# Patient Record
Sex: Male | Born: 1935 | Race: White | Hispanic: No | Smoking: Former smoker
Health system: Southern US, Community
[De-identification: ages and names within clinical notes are randomized; demographics above are authoritative.]

## PROBLEM LIST (undated history)

## (undated) DIAGNOSIS — J449 Chronic obstructive pulmonary disease, unspecified: Secondary | ICD-10-CM

## (undated) DIAGNOSIS — I10 Essential (primary) hypertension: Secondary | ICD-10-CM

## (undated) DIAGNOSIS — F419 Anxiety disorder, unspecified: Secondary | ICD-10-CM

## (undated) DIAGNOSIS — N3941 Urge incontinence: Secondary | ICD-10-CM

## (undated) DIAGNOSIS — G500 Trigeminal neuralgia: Secondary | ICD-10-CM

## (undated) DIAGNOSIS — N401 Enlarged prostate with lower urinary tract symptoms: Secondary | ICD-10-CM

## (undated) DIAGNOSIS — E538 Deficiency of other specified B group vitamins: Secondary | ICD-10-CM

## (undated) DIAGNOSIS — M545 Low back pain, unspecified: Secondary | ICD-10-CM

## (undated) DIAGNOSIS — R200 Anesthesia of skin: Secondary | ICD-10-CM

## (undated) DIAGNOSIS — G4730 Sleep apnea, unspecified: Secondary | ICD-10-CM

## (undated) DIAGNOSIS — I4430 Unspecified atrioventricular block: Secondary | ICD-10-CM

## (undated) DIAGNOSIS — N289 Disorder of kidney and ureter, unspecified: Secondary | ICD-10-CM

## (undated) DIAGNOSIS — M109 Gout, unspecified: Secondary | ICD-10-CM

## (undated) DIAGNOSIS — F32A Depression, unspecified: Secondary | ICD-10-CM

## (undated) DIAGNOSIS — R972 Elevated prostate specific antigen [PSA]: Secondary | ICD-10-CM

## (undated) DIAGNOSIS — F329 Major depressive disorder, single episode, unspecified: Secondary | ICD-10-CM

## (undated) DIAGNOSIS — N400 Enlarged prostate without lower urinary tract symptoms: Secondary | ICD-10-CM

## (undated) DIAGNOSIS — K5792 Diverticulitis of intestine, part unspecified, without perforation or abscess without bleeding: Secondary | ICD-10-CM

## (undated) DIAGNOSIS — G8929 Other chronic pain: Secondary | ICD-10-CM

## (undated) DIAGNOSIS — M519 Unspecified thoracic, thoracolumbar and lumbosacral intervertebral disc disorder: Secondary | ICD-10-CM

## (undated) DIAGNOSIS — N138 Other obstructive and reflux uropathy: Secondary | ICD-10-CM

## (undated) HISTORY — DX: Diverticulitis of intestine, part unspecified, without perforation or abscess without bleeding: K57.92

## (undated) HISTORY — DX: Trigeminal neuralgia: G50.0

## (undated) HISTORY — DX: Urge incontinence: N39.41

## (undated) HISTORY — DX: Benign prostatic hyperplasia with lower urinary tract symptoms: N40.1

## (undated) HISTORY — DX: Deficiency of other specified B group vitamins: E53.8

## (undated) HISTORY — PX: CATARACT EXTRACTION: SUR2

## (undated) HISTORY — DX: Depression, unspecified: F32.A

## (undated) HISTORY — DX: Other obstructive and reflux uropathy: N13.8

## (undated) HISTORY — DX: Anesthesia of skin: R20.0

## (undated) HISTORY — DX: Low back pain, unspecified: M54.50

## (undated) HISTORY — DX: Major depressive disorder, single episode, unspecified: F32.9

## (undated) HISTORY — PX: PACEMAKER INSERTION: SHX728

## (undated) HISTORY — DX: Unspecified thoracic, thoracolumbar and lumbosacral intervertebral disc disorder: M51.9

## (undated) HISTORY — DX: Essential (primary) hypertension: I10

## (undated) HISTORY — PX: FUNCTIONAL ENDOSCOPIC SINUS SURGERY: SUR616

## (undated) HISTORY — DX: Sleep apnea, unspecified: G47.30

## (undated) HISTORY — DX: Disorder of kidney and ureter, unspecified: N28.9

## (undated) HISTORY — DX: Benign prostatic hyperplasia without lower urinary tract symptoms: N40.0

## (undated) HISTORY — PX: HERNIA REPAIR: SHX51

## (undated) HISTORY — DX: Chronic obstructive pulmonary disease, unspecified: J44.9

## (undated) HISTORY — DX: Gout, unspecified: M10.9

## (undated) HISTORY — PX: APPENDECTOMY: SHX54

## (undated) HISTORY — DX: Anxiety disorder, unspecified: F41.9

## (undated) HISTORY — PX: ESOPHAGOGASTRODUODENOSCOPY: SHX1529

## (undated) HISTORY — DX: Low back pain: M54.5

## (undated) HISTORY — DX: Other chronic pain: G89.29

## (undated) HISTORY — DX: Elevated prostate specific antigen (PSA): R97.20

---

## 2004-06-23 ENCOUNTER — Ambulatory Visit: Payer: Self-pay | Admitting: Cardiology

## 2004-09-07 ENCOUNTER — Ambulatory Visit: Payer: Self-pay | Admitting: Otolaryngology

## 2005-01-04 ENCOUNTER — Ambulatory Visit (HOSPITAL_COMMUNITY): Admission: RE | Admit: 2005-01-04 | Discharge: 2005-01-04 | Payer: Self-pay | Admitting: *Deleted

## 2005-06-20 ENCOUNTER — Ambulatory Visit: Payer: Self-pay | Admitting: Ophthalmology

## 2005-08-03 ENCOUNTER — Ambulatory Visit: Payer: Self-pay | Admitting: Vascular Surgery

## 2007-08-21 ENCOUNTER — Ambulatory Visit: Payer: Self-pay | Admitting: Rheumatology

## 2007-11-21 ENCOUNTER — Other Ambulatory Visit: Payer: Self-pay

## 2007-11-21 ENCOUNTER — Inpatient Hospital Stay: Payer: Self-pay | Admitting: Internal Medicine

## 2012-04-06 DIAGNOSIS — N400 Enlarged prostate without lower urinary tract symptoms: Secondary | ICD-10-CM | POA: Insufficient documentation

## 2012-04-06 DIAGNOSIS — N401 Enlarged prostate with lower urinary tract symptoms: Secondary | ICD-10-CM | POA: Insufficient documentation

## 2012-04-06 DIAGNOSIS — R350 Frequency of micturition: Secondary | ICD-10-CM | POA: Insufficient documentation

## 2012-04-06 DIAGNOSIS — R972 Elevated prostate specific antigen [PSA]: Secondary | ICD-10-CM | POA: Insufficient documentation

## 2013-01-30 ENCOUNTER — Inpatient Hospital Stay: Payer: Self-pay | Admitting: Internal Medicine

## 2013-01-30 DIAGNOSIS — Z950 Presence of cardiac pacemaker: Secondary | ICD-10-CM | POA: Insufficient documentation

## 2013-01-30 LAB — URINALYSIS, COMPLETE
Bilirubin,UR: NEGATIVE
Blood: NEGATIVE
Ketone: NEGATIVE
Leukocyte Esterase: NEGATIVE
RBC,UR: <1 /HPF (ref 0–5)
Specific Gravity: 1.015 (ref 1.003–1.030)
Squamous Epithelial: NONE SEEN
WBC UR: NONE SEEN /HPF (ref 0–5)

## 2013-01-30 LAB — TROPONIN I: Troponin-I: <0.02 ng/mL

## 2013-01-30 LAB — CBC
HCT: 41.4 % (ref 40.0–52.0)
MCH: 32.3 pg (ref 26.0–34.0)
MCHC: 34.4 g/dL (ref 32.0–36.0)
MCV: 94 fL (ref 80–100)
Platelet: 189 10*3/uL (ref 150–440)
RBC: 4.41 10*6/uL (ref 4.40–5.90)
WBC: 10.1 10*3/uL (ref 3.8–10.6)

## 2013-01-30 LAB — CK TOTAL AND CKMB (NOT AT ARMC)
CK, Total: 163 U/L (ref 35–232)
CK-MB: 3.8 ng/mL — ABNORMAL HIGH (ref 0.5–3.6)
CK-MB: 2.7 ng/mL (ref 0.5–3.6)

## 2013-01-30 LAB — COMPREHENSIVE METABOLIC PANEL
Alkaline Phosphatase: 55 U/L (ref 50–136)
BUN: 21 mg/dL — ABNORMAL HIGH (ref 7–18)
Calcium, Total: 9 mg/dL (ref 8.5–10.1)
Co2: 24 mmol/L (ref 21–32)
Creatinine: 1.5 mg/dL — ABNORMAL HIGH (ref 0.60–1.30)
EGFR (African American): 50 — ABNORMAL LOW
EGFR (Non-African Amer.): 43 — ABNORMAL LOW
Potassium: 4.4 mmol/L (ref 3.5–5.1)
Total Protein: 7.1 g/dL (ref 6.4–8.2)

## 2013-01-30 LAB — PROTIME-INR
INR: 1
Prothrombin Time: 13.3 secs (ref 11.5–14.7)

## 2013-01-31 LAB — CBC WITH DIFFERENTIAL/PLATELET
Basophil #: 0.1 10*3/uL (ref 0.0–0.1)
Eosinophil #: 0.1 10*3/uL (ref 0.0–0.7)
Eosinophil %: 0.7 %
HGB: 13.8 g/dL (ref 13.0–18.0)
Monocyte #: 0.8 x10 3/mm (ref 0.2–1.0)
Monocyte %: 10.7 %
Neutrophil #: 4.9 10*3/uL (ref 1.4–6.5)
RBC: 4.26 10*6/uL — ABNORMAL LOW (ref 4.40–5.90)
RDW: 14.3 % (ref 11.5–14.5)
WBC: 7.7 10*3/uL (ref 3.8–10.6)

## 2013-01-31 LAB — BASIC METABOLIC PANEL
Anion Gap: 6 — ABNORMAL LOW (ref 7–16)
BUN: 17 mg/dL (ref 7–18)
Calcium, Total: 9.1 mg/dL (ref 8.5–10.1)
Chloride: 107 mmol/L (ref 98–107)
EGFR (African American): 56 — ABNORMAL LOW
Glucose: 85 mg/dL (ref 65–99)
Osmolality: 280 (ref 275–301)
Potassium: 4.2 mmol/L (ref 3.5–5.1)

## 2013-01-31 LAB — LIPID PANEL
Cholesterol: 126 mg/dL (ref 0–200)
HDL Cholesterol: 33 mg/dL — ABNORMAL LOW (ref 40–60)
Ldl Cholesterol, Calc: 59 mg/dL (ref 0–100)
Triglycerides: 172 mg/dL (ref 0–200)

## 2013-01-31 LAB — CK TOTAL AND CKMB (NOT AT ARMC)
CK, Total: 89 U/L (ref 35–232)
CK-MB: 1.8 ng/mL (ref 0.5–3.6)

## 2013-01-31 LAB — TROPONIN I: Troponin-I: <0.02 ng/mL

## 2013-07-22 ENCOUNTER — Ambulatory Visit: Payer: Self-pay | Admitting: Physical Medicine and Rehabilitation

## 2013-09-12 DIAGNOSIS — J449 Chronic obstructive pulmonary disease, unspecified: Secondary | ICD-10-CM | POA: Insufficient documentation

## 2013-09-12 DIAGNOSIS — I10 Essential (primary) hypertension: Secondary | ICD-10-CM | POA: Insufficient documentation

## 2013-10-11 DIAGNOSIS — M5116 Intervertebral disc disorders with radiculopathy, lumbar region: Secondary | ICD-10-CM | POA: Insufficient documentation

## 2013-10-11 DIAGNOSIS — M47816 Spondylosis without myelopathy or radiculopathy, lumbar region: Secondary | ICD-10-CM | POA: Insufficient documentation

## 2013-10-11 DIAGNOSIS — M51369 Other intervertebral disc degeneration, lumbar region without mention of lumbar back pain or lower extremity pain: Secondary | ICD-10-CM | POA: Insufficient documentation

## 2013-10-11 DIAGNOSIS — M5136 Other intervertebral disc degeneration, lumbar region: Secondary | ICD-10-CM | POA: Insufficient documentation

## 2014-04-07 DIAGNOSIS — E538 Deficiency of other specified B group vitamins: Secondary | ICD-10-CM | POA: Insufficient documentation

## 2014-04-07 DIAGNOSIS — G479 Sleep disorder, unspecified: Secondary | ICD-10-CM | POA: Insufficient documentation

## 2014-04-07 DIAGNOSIS — G500 Trigeminal neuralgia: Secondary | ICD-10-CM | POA: Insufficient documentation

## 2014-04-07 DIAGNOSIS — R200 Anesthesia of skin: Secondary | ICD-10-CM | POA: Insufficient documentation

## 2014-05-20 ENCOUNTER — Ambulatory Visit (INDEPENDENT_AMBULATORY_CARE_PROVIDER_SITE_OTHER): Payer: Medicare Other

## 2014-05-20 ENCOUNTER — Ambulatory Visit (INDEPENDENT_AMBULATORY_CARE_PROVIDER_SITE_OTHER): Payer: Medicare Other | Admitting: Podiatry

## 2014-05-20 ENCOUNTER — Encounter: Payer: Self-pay | Admitting: Podiatry

## 2014-05-20 VITALS — BP 132/75 | HR 75 | Resp 16 | Ht 70.0 in | Wt 190.0 lb

## 2014-05-20 DIAGNOSIS — M779 Enthesopathy, unspecified: Secondary | ICD-10-CM

## 2014-05-20 DIAGNOSIS — M10072 Idiopathic gout, left ankle and foot: Secondary | ICD-10-CM

## 2014-05-20 DIAGNOSIS — M109 Gout, unspecified: Secondary | ICD-10-CM

## 2014-05-20 MED ORDER — METHYLPREDNISOLONE (PAK) 4 MG PO TABS: ORAL_TABLET | ORAL | Status: DC

## 2014-05-20 NOTE — Patient Instructions (Signed)
Monitor for any signs/symptoms of infection. Call the office immediately if any occur or go directly to the emergency room. Call with any questions/concerns.    Gout Gout is an inflammatory arthritis caused by a buildup of uric acid crystals in the joints. Uric acid is a chemical that is normally present in the blood. When the level of uric acid in the blood is too high it can form crystals that deposit in your joints and tissues. This causes joint redness, soreness, and swelling (inflammation). Repeat attacks are common. Over time, uric acid crystals can form into masses (tophi) near a joint, destroying bone and causing disfigurement. Gout is treatable and often preventable. CAUSES  The disease begins with elevated levels of uric acid in the blood. Uric acid is produced by your body when it breaks down a naturally found substance called purines. Certain foods you eat, such as meats and fish, contain high amounts of purines. Causes of an elevated uric acid level include:  Being passed down from parent to child (heredity).  Diseases that cause increased uric acid production (such as obesity, psoriasis, and certain cancers).  Excessive alcohol use.  Diet, especially diets rich in meat and seafood.  Medicines, including certain cancer-fighting medicines (chemotherapy), water pills (diuretics), and aspirin.  Chronic kidney disease. The kidneys are no longer able to remove uric acid well.  Problems with metabolism. Conditions strongly associated with gout include:  Obesity.  High blood pressure.  High cholesterol.  Diabetes. Not everyone with elevated uric acid levels gets gout. It is not understood why some people get gout and others do not. Surgery, joint injury, and eating too much of certain foods are some of the factors that can lead to gout attacks. SYMPTOMS   An attack of gout comes on quickly. It causes intense pain with redness, swelling, and warmth in a joint.  Fever can  occur.  Often, only one joint is involved. Certain joints are more commonly involved:  Base of the big toe.  Knee.  Ankle.  Wrist.  Finger. Without treatment, an attack usually goes away in a few days to weeks. Between attacks, you usually will not have symptoms, which is different from many other forms of arthritis. DIAGNOSIS  Your caregiver will suspect gout based on your symptoms and exam. In some cases, tests may be recommended. The tests may include:  Blood tests.  Urine tests.  X-rays.  Joint fluid exam. This exam requires a needle to remove fluid from the joint (arthrocentesis). Using a microscope, gout is confirmed when uric acid crystals are seen in the joint fluid. TREATMENT  There are two phases to gout treatment: treating the sudden onset (acute) attack and preventing attacks (prophylaxis).  Treatment of an Acute Attack.  Medicines are used. These include anti-inflammatory medicines or steroid medicines.  An injection of steroid medicine into the affected joint is sometimes necessary.  The painful joint is rested. Movement can worsen the arthritis.  You may use warm or cold treatments on painful joints, depending which works best for you.  Treatment to Prevent Attacks.  If you suffer from frequent gout attacks, your caregiver may advise preventive medicine. These medicines are started after the acute attack subsides. These medicines either help your kidneys eliminate uric acid from your body or decrease your uric acid production. You may need to stay on these medicines for a very long time.  The early phase of treatment with preventive medicine can be associated with an increase in acute gout attacks. For   this reason, during the first few months of treatment, your caregiver may also advise you to take medicines usually used for acute gout treatment. Be sure you understand your caregiver's directions. Your caregiver may make several adjustments to your medicine  dose before these medicines are effective.  Discuss dietary treatment with your caregiver or dietitian. Alcohol and drinks high in sugar and fructose and foods such as meat, poultry, and seafood can increase uric acid levels. Your caregiver or dietitian can advise you on drinks and foods that should be limited. HOME CARE INSTRUCTIONS   Do not take aspirin to relieve pain. This raises uric acid levels.  Only take over-the-counter or prescription medicines for pain, discomfort, or fever as directed by your caregiver.  Rest the joint as much as possible. When in bed, keep sheets and blankets off painful areas.  Keep the affected joint raised (elevated).  Apply warm or cold treatments to painful joints. Use of warm or cold treatments depends on which works best for you.  Use crutches if the painful joint is in your leg.  Drink enough fluids to keep your urine clear or pale yellow. This helps your body get rid of uric acid. Limit alcohol, sugary drinks, and fructose drinks.  Follow your dietary instructions. Pay careful attention to the amount of protein you eat. Your daily diet should emphasize fruits, vegetables, whole grains, and fat-free or low-fat milk products. Discuss the use of coffee, vitamin C, and cherries with your caregiver or dietitian. These may be helpful in lowering uric acid levels.  Maintain a healthy body weight. SEEK MEDICAL CARE IF:   You develop diarrhea, vomiting, or any side effects from medicines.  You do not feel better in 24 hours, or you are getting worse. SEEK IMMEDIATE MEDICAL CARE IF:   Your joint becomes suddenly more tender, and you have chills or a fever. MAKE SURE YOU:   Understand these instructions.  Will watch your condition.  Will get help right away if you are not doing well or get worse. Document Released: 05/20/2000 Document Revised: 10/07/2013 Document Reviewed: 01/04/2012 [employer] Patient Information 2015 [employer]. This information  is not intended to replace advice given to you by your health care provider. Make sure you discuss any questions you have with your health care provider.  

## 2014-05-20 NOTE — Progress Notes (Signed)
   Subjective:    Patient ID: Ronald Weber, male    DOB: Mar 09, 1929, 80 y.o.   MRN: [id number]  HPI Comments: 80 year old male presents the office they with complaints of left foot swelling and pain. He states that this has been ongoing for approximate 5 days his remained about the same. He definitely he did start taking once a day prednisone a couple days ago however it has not been helping. He states that he has pain particularly with walking and standing and pressure over the area. Patient states this has happened before and he has received a steroid injection which helped alleviate his symptoms. Patient denies any recent open lesions, cut/scrapes the foot. He denies any systemic complaints such as fevers, chills, nausea, vomiting. Denies any recent injury or trauma to the area. No other complaints at this time.  Foot Pain Associated symptoms include coughing.      Review of Systems  Respiratory: Positive for cough.   All other systems reviewed and are negative.      Objective:   Physical Exam  AAO 3, NAD DP/PT pulses palpable bilaterally, CRT less than 3 seconds Protective sensation intact with Simms Weinstein monofilament, vibratory sensation intact, Achilles tendon reflex intact. There is edema to the left foot. There is erythema overlying the first MTPJ and along the dorsal medial aspect of the left foot approximately level of the first metatarsal cuneiform joint. There is also localized erythema over this area. Erythema is ill-defined and there is no evidence of ascending cellulitis. There is no areas of fluctuance or crepitus. There is no pain with ankle joint or subtalar joint range of motion. There is mild discomfort with first MTPJ range of motion. No open lesions or pre-ulcerative lesions. No pain with calf compression, swelling, warmth, erythema. MMT and ROM decreased on the left, intact on the right.          Assessment & Plan:  80 year old male with left foot  erythema, edema likely gout. -X-rays were obtained and reviewed with the patient. -Treatment options were discussed including alternatives, risks, complications. -At this time, given the patient's history and current symptoms will treat for gout. We'll start a Medrol Dosepak. We'll obtain CBC, ESR, CRP, uric acid. We'll call the patient once the results are obtained. -Patient is requesting steroid injection into the area as he is previously had this which helped alleviate his symptoms. Risks and complications of the injection were discussed the patient. Under sterile conditions a total of 1.5 mL of a mixture of dexamethasone and 0.5% Marcaine plain was infiltrated into the area of tenderness overlying the first MTPJ in medial foot. Band-Aids were applied. Patient tolerated the injection well without any complications. Post injection care was discussed with the patient. -Continue to monitor the area for any changes symptoms or any worsening. Should any symptoms worsened the patient directed to call the office immediately or go directly to the emergency room. -Follow-up in 1 week or sooner should any problems arise. In the meantime, call the office with any questions, concerns, change in symptoms.

## 2014-05-21 LAB — CBC WITH DIFFERENTIAL/PLATELET
Basophils Absolute: 0 10*3/uL (ref 0.0–0.2)
Basos: 0 %
Eos: 0 %
Eosinophils Absolute: 0 10*3/uL (ref 0.0–0.4)
HCT: 40.5 % (ref 37.5–51.0)
HEMOGLOBIN: 13.2 g/dL (ref 12.6–17.7)
IMMATURE GRANS (ABS): 0 10*3/uL (ref 0.0–0.1)
IMMATURE GRANULOCYTES: 0 %
Lymphocytes Absolute: 0.8 10*3/uL (ref 0.7–3.1)
Lymphs: 7 %
MCH: 30.6 pg (ref 26.6–33.0)
MCHC: 32.6 g/dL (ref 31.5–35.7)
MCV: 94 fL (ref 79–97)
MONOCYTES: 10 %
Monocytes Absolute: 1.2 10*3/uL — ABNORMAL HIGH (ref 0.1–0.9)
NEUTROS PCT: 83 %
Neutrophils Absolute: 10.3 10*3/uL — ABNORMAL HIGH (ref 1.4–7.0)
RBC: 4.31 x10E6/uL (ref 4.14–5.80)
RDW: 14.8 % (ref 12.3–15.4)
WBC: 12.4 10*3/uL — ABNORMAL HIGH (ref 3.4–10.8)

## 2014-05-21 LAB — SEDIMENTATION RATE: Sed Rate: 8 mm/hr (ref 0–30)

## 2014-05-21 LAB — URIC ACID: Uric Acid: 7.8 mg/dL (ref 3.7–8.6)

## 2014-05-21 LAB — C-REACTIVE PROTEIN: CRP: 6.7 mg/L — AB (ref 0.0–4.9)

## 2014-05-22 ENCOUNTER — Telehealth: Payer: Self-pay | Admitting: Podiatry

## 2014-05-22 NOTE — Telephone Encounter (Signed)
I call the patient to discuss the blood work results. The patient states that since his appointment he has had decreased pain, swelling, redness. He is now able to ambulate without at the assistance of a cane. I discussed with the patient that his white blood cell count was elevated to 12.4 and his uric acid was within normal limits and CRP was 6.7. I discussed with the White blood cell count could be elevated due to infection or possible steroid use. At this time as the patient has decreased symptoms on the steroids and after the injection will continue to treat his gout. I discussed with the patient to monitor closely for any signs or symptoms of infection and directed to call the office immediately should any occur or go directly to the emergency room. Patient encouraged to call with any questions or concerns or any change in symptoms. Follow-up as scheduled, or sooner if needed.

## 2014-05-27 ENCOUNTER — Ambulatory Visit (INDEPENDENT_AMBULATORY_CARE_PROVIDER_SITE_OTHER): Payer: Medicare Other | Admitting: Podiatry

## 2014-05-27 VITALS — BP 133/75 | HR 96 | Resp 16

## 2014-05-27 DIAGNOSIS — M109 Gout, unspecified: Secondary | ICD-10-CM

## 2014-05-27 DIAGNOSIS — M10072 Idiopathic gout, left ankle and foot: Secondary | ICD-10-CM

## 2014-05-27 NOTE — Patient Instructions (Signed)

## 2014-06-02 NOTE — Progress Notes (Signed)
Patient ID: Ronald Weber, male   DOB: [birth year], 80 y.o.   MRN: [id number]  Subjective: 80 year old male returns the office today for follow-up evaluation of left foot likely gout. The patient states that he has completed his course of steroids. At this time he states that he has no pain to the area and the swelling has significantly improved. He states he is able to ambulate without any difficulty. He denies any systemic complaints as fevers, chills, nausea, vomiting. No acute changes since last evening no other complaints at this time.  Objective: AAO 3, NAD DP/PT pulses palpable laterally, CRT less than 3 seconds  Protective sensation is intact with Simms Weinstein monofilament, vibratory sensation intact On the left foot there is no overlying edema, erythema, increase in warmth. There is no tenderness to palpation or pain with vibratory sensation. The patient is able to ambulate without the assistance of a cane.  No open lesions or pre-ulcerative lesions. No pain with calf compression, swelling, warmth, erythema.  Assessment: 80 year old male with resolving left foot pain, likely gout  Plan: -Treatment options discussed with the patient including alternatives, risks, complications -At this time the patient symptoms have resolved. Discussed with the patient ways to help prevent future gout occurrences. -Follow-up as needed. In the meantime, call the office with any questions, concerns, change in symptoms.

## 2014-08-05 ENCOUNTER — Ambulatory Visit (INDEPENDENT_AMBULATORY_CARE_PROVIDER_SITE_OTHER): Payer: Medicare Other | Admitting: Podiatry

## 2014-08-05 ENCOUNTER — Ambulatory Visit (INDEPENDENT_AMBULATORY_CARE_PROVIDER_SITE_OTHER): Payer: Medicare Other

## 2014-08-05 ENCOUNTER — Encounter: Payer: Self-pay | Admitting: Podiatry

## 2014-08-05 VITALS — BP 182/88 | HR 75

## 2014-08-05 DIAGNOSIS — M10072 Idiopathic gout, left ankle and foot: Secondary | ICD-10-CM | POA: Diagnosis not present

## 2014-08-05 DIAGNOSIS — R609 Edema, unspecified: Secondary | ICD-10-CM

## 2014-08-05 DIAGNOSIS — M109 Gout, unspecified: Secondary | ICD-10-CM

## 2014-08-05 MED ORDER — METHYLPREDNISOLONE (PAK) 4 MG PO TABS: ORAL_TABLET | ORAL | Status: DC

## 2014-08-05 NOTE — Patient Instructions (Signed)

## 2014-08-06 ENCOUNTER — Encounter: Payer: Self-pay | Admitting: Podiatry

## 2014-08-06 DIAGNOSIS — M109 Gout, unspecified: Secondary | ICD-10-CM | POA: Insufficient documentation

## 2014-08-06 LAB — SEDIMENTATION RATE: Sed Rate: 3 mm/hr (ref 0–30)

## 2014-08-06 LAB — CBC WITH DIFFERENTIAL/PLATELET
BASOS ABS: 0 10*3/uL (ref 0.0–0.2)
Basos: 1 %
EOS ABS: 0.1 10*3/uL (ref 0.0–0.4)
Eos: 1 %
HEMATOCRIT: 41.7 % (ref 37.5–51.0)
Hemoglobin: 13.7 g/dL (ref 12.6–17.7)
Immature Grans (Abs): 0 10*3/uL (ref 0.0–0.1)
Immature Granulocytes: 0 %
LYMPHS ABS: 0.9 10*3/uL (ref 0.7–3.1)
Lymphs: 11 %
MCH: 30.1 pg (ref 26.6–33.0)
MCHC: 32.9 g/dL (ref 31.5–35.7)
MCV: 92 fL (ref 79–97)
Monocytes Absolute: 0.9 10*3/uL (ref 0.1–0.9)
Monocytes: 11 %
NEUTROS ABS: 6.3 10*3/uL (ref 1.4–7.0)
Neutrophils Relative %: 76 %
Platelets: 207 10*3/uL (ref 150–379)
RBC: 4.55 x10E6/uL (ref 4.14–5.80)
RDW: 13.6 % (ref 12.3–15.4)
WBC: 8.2 10*3/uL (ref 3.4–10.8)

## 2014-08-06 LAB — C-REACTIVE PROTEIN: CRP: 25 mg/L — AB (ref 0.0–4.9)

## 2014-08-06 LAB — URIC ACID: URIC ACID: 8.6 mg/dL (ref 3.7–8.6)

## 2014-08-06 NOTE — Progress Notes (Signed)
Patient ID: Ronald Weber, male   DOB: 1928/12/28, 81 y.o.   MRN: [id number]  Subjective: 81 year old male presents the office today with complaints of left lateral ankle swelling, redness, pain. He states this started naproxen 4 days ago. He denies any history of injury or trauma. He states that it was a sudden onset and is very painful. He states he has pain with ambulation. He denies any recent open lesions or any abrasions to the area. He denies any change in diet recently. He denies any systemic complaints as fevers, chills, nausea, vomiting. No other complaints at this time.  Objective: AAO 3, NAD, ambulates with a cane wearing a regular shoe DP/PT pulses palpable although decreased bilaterally, CRT less than 3 seconds Protective sensation intact with Simms Weinstein monofilament, vibratory sensation intact, Achilles tendon reflex intact. On the left ankle on the lateral aspect there is an annular area of erythema and edema. There is mild increase in warmth overlying the area of erythema. There is mild discomfort with ankle joint range of motion particularly over the lateral aspect of the ankle. There is no overlying areas of fluctuance or crepitus. There is no ascending cellulitis. There does not appear to be any pinpoint areas of bony tenderness or pain the vibratory sensation overlying the foot or ankle. No open lesions or pre-ulcerative lesions are identified bilaterally. No pain with calf compression, swelling, warmth, erythema.  Assessment: 81 year old male with likely recurrent gout  Plan: -X-rays were obtained and reviewed the patient. -Treatment options were discussed with the patient include alternatives, risks, complications -At this time discussed likely etiology of the patient's symptoms and most likely correlates to gout. At this time would like to start treatment for gout however he is allergic to colchicine (states he cannot tolerate taking it at all), and he has been advised  not to take any anti-inflammatories due to his heart condition. Because of this we'll start a Medrol Dosepak and obtain blood work. -Patient has also elected to proceed with a steroid injection into the area. Under sterile conditions a total of 1 mL mixture of dexamethasone phosphate and 0.5% Marcaine plain was infiltrated into the area of maximal tenderness on the lateral aspect of the left ankle. A Band-Aid was applied. Postinjection care was discussed with the patient. -Recommended the patient to follow-up with his primary care physician regards this as this is now his likely second gout attack in the last couple of months. He may benefit from allopurinol or Uloric.  -Follow-up in 2 weeks or sooner any problems are to arise. In the meantime, encouraged to call the office with any questions, concerns, change in symptoms.

## 2014-09-08 ENCOUNTER — Encounter: Payer: Self-pay | Admitting: Podiatry

## 2014-09-08 ENCOUNTER — Ambulatory Visit (INDEPENDENT_AMBULATORY_CARE_PROVIDER_SITE_OTHER): Payer: Medicare Other | Admitting: Podiatry

## 2014-09-08 VITALS — BP 169/106 | HR 82 | Resp 18 | Ht 70.0 in | Wt 190.0 lb

## 2014-09-08 DIAGNOSIS — M10072 Idiopathic gout, left ankle and foot: Secondary | ICD-10-CM | POA: Diagnosis not present

## 2014-09-08 DIAGNOSIS — M109 Gout, unspecified: Secondary | ICD-10-CM

## 2014-09-08 DIAGNOSIS — M779 Enthesopathy, unspecified: Secondary | ICD-10-CM | POA: Diagnosis not present

## 2014-09-08 MED ORDER — PREDNISONE 10 MG PO TABS: ORAL_TABLET | ORAL | Status: DC

## 2014-09-08 MED ORDER — COLCHICINE 0.6 MG PO TABS: 0.6000 mg | ORAL_TABLET | Freq: Every day | ORAL | Status: DC

## 2014-09-08 NOTE — Progress Notes (Signed)
[name] presents today with a chief complaint of painful first metatarsophalangeal joint of the right foot. He states that he has had gout for years and was on colchicine until he ran out and never refilled the medication. He states that usually he takes prednisone which helped considerably. He states that this episode flared up just a few days ago and is extremely painful. He denies fever chills nausea vomiting muscle aches and pains.  Objective: Pulses are strongly palpable to the right foot. Erythema and edema to the first metatarsophalangeal joint which is painful on palpation and is very warm to the touch. There is no cellulitis no streaking no signs of infection.  Assessment: Capsulitis hallux valgus deformity with gouty capsulitis first metatarsophalangeal joint right foot.  Plan: Injected peri-articularly today with Kenalog and local anesthetic. Put him on a six-day dose of prednisone 10 mg tablets. Also started him on colchicine. I will follow-up with him in 2 weeks.

## 2014-09-26 NOTE — Op Note (Signed)
PATIENT NAME:  Patricia PesaANCE, Daymon T MR#:  [id number] DATE OF BIRTH:  Jan 12, 1929  DATE OF PROCEDURE:  01/31/2013  PRIMARY CARE PHYSICIAN: Dr. Dareen PianoAnderson.  PREPROCEDURE DIAGNOSIS: Complete heart block.   PROCEDURE: Dual chamber pacemaker implantation.   POSTPROCEDURE DIAGNOSIS: Atrial sensing with ventricular pacing.   INDICATION: The patient is an 81 year old gentleman, who [facility] with generalized weakness, shortness of breath and dizziness for several weeks. In the Emergency Room, the patient was noted to be markedly bradycardic with a heart rate of 33 bpm and AV dissociation consistent with complete heart block. The procedure, risks benefits, and alternatives of permanent pacemaker implantation were explained to the patient and informed written consent was obtained.   DESCRIPTION OF PROCEDURE: He was brought to the operating room in a fasting state. The left pectoral region was prepped and draped in the usual sterile manner. Anesthesia was obtained with 1% Xylocaine locally. A 6 cm incision was performed over the left pectoral region. The pacemaker pocket was generated by electrocautery and blunt dissection. Access was obtained to the left subclavian vein by fine needle aspiration. The right ventricle and right atrial leads were positioned to right ventricular intraventricular septum and right atrial appendage. After proper thresholds were obtained, the leads were sutured in place. The pacemaker pocket was irrigated with gentamicin solution. The leads were connected to a dual chamber rate responsive pacemaker generator (Medtronic Adapta [id number]) and positioned into the pocket. The pocket was closed with 2-0 and 4-0 Vicryl respectively. Steri-Strips and a compression dressing were applied.   ____________________________ Marcina MillardAlexander Oddie Bottger, MD ap:aw D: 01/31/2013 13:31:43 ET T: 01/31/2013 13:57:38 ET JOB#: [id number]  cc: Marcina MillardAlexander Colden Samaras, MD, <Dictator> Marcina MillardALEXANDER Clair Alfieri  MD ELECTRONICALLY SIGNED 02/14/2013 7:58

## 2014-09-26 NOTE — Discharge Summary (Signed)
PATIENT NAME:  Ronald Weber, Naol T MR#:  [id number] DATE OF BIRTH:  05/31/1929  DATE OF ADMISSION:  01/30/2013 DATE OF DISCHARGE:  02/01/2013  DISCHARGE DIAGNOSES: 1.  Third degree heart block.  2.  Insomnia.   DISCHARGE MEDICATIONS: Per [facility] med reconciliation system.   HISTORY AND PHYSICAL: Please see detailed history and physical done on admission.  HOSPITAL COURSE:  The patient was admitted with third degree heart block, rate in the 30s. Cardiology consult  was obtained. They elected to watch him overnight with temporary external pads on. He remained stable with that. Permanent pacemaker was inserted the next day, on the 28th.  He remained stable with that. Seems to be functioning this morning. Follow up with the inserting cardiologist next week for further evaluation. Further workup was unrevealing. Chest x-ray was fine post insertion. He ruled out for MI by cardiac enzymes. TSH was in the normal range. Cholesterol was 126 with LDL of 59. Creatinine was minimally elevated at 1.36 and this is likely perfusion related from above.    ____________________________ Marya AmslerMarshall W. Dareen PianoAnderson, MD mwa:dmm D: 02/01/2013 07:52:49 ET T: 02/01/2013 09:34:25 ET JOB#: [id number]  cc: Marya AmslerMarshall W. Dareen PianoAnderson, MD, <Dictator> Lauro RegulusMARSHALL W Chantea Surace MD ELECTRONICALLY SIGNED 02/01/2013 13:46

## 2014-09-26 NOTE — H&P (Signed)
PATIENT NAME:  Patricia PesaANCE, Divante T MR#:  [id number] DATE OF BIRTH:  07/10/1928  DATE OF ADMISSION:  01/30/2013  PRIMARY CARE PROVIDER: Marya AmslerMarshall W. Dareen PianoAnderson, MD  EMERGENCY DEPARTMENT REFERRING PHYSICIAN: Sheryl L. Mindi JunkerGottlieb, MD   CHIEF COMPLAINT: Generalized weakness, low pulse rate.   HISTORY OF PRESENT ILLNESS: The patient is an [age] year old white male who has history of hypercholesterolemia, COPD/asthma, history of pernicious anemia, hypertension, who is not on any medications, who states that he was doing well up until the past few weeks, when he has been feeling weak. His wife has a pulse oximetry meter, so he checks his pulse on a daily basis, and he checked his pulse today, and it was in the 30s. The patient came to the ED and was noted to have a third-degree heart block. His blood pressure is currently actually elevated at 192/97. The patient complains of some shortness of breath and weakness, but no chest pain. He was seen in consultation by Dr. Gwen PoundsKowalski, who will be placing a pacemaker tomorrow. The patient otherwise denies any chest pains or palpitations. He did have some dizziness. He denies any abdominal pain, nausea, vomiting or diarrhea.   PAST MEDICAL HISTORY:  1. History of postpolio neuropathy.  2. History of COPD, which he denies.  3. History of diverticulitis.  4. Hypertension.  5. Sleep apnea.  6. Gout.  7. Lumbar disk disease.  8. History of pernicious anemia.   PAST SURGICAL HISTORY:  1. Status post appendectomy with rupture.  2. Hernia repair.   CURRENT MEDICATIONS:  1. Ambien 5 at bedtime.  2. Tramadol 50 daily in the morning.  3. Vitamin B12 1000 mcg IM monthly.   SOCIAL HISTORY: Smokes cigars on a daily basis. Drinks socially. No drug use.   ALLERGIES: SULFA, AUGMENTIN, DOXYCYCLINE, PNEUMO TUSSIN, MOBIC.    FAMILY HISTORY: Positive for hypertension.   REVIEW OF SYSTEMS:   CONSTITUTIONAL: Denies any fever. Complains of fatigue, weakness. No pain. No weight loss,  no weight gain.  EYES: No blurred or double vision. No pain. No redness. No inflammation. Has a history of cataracts. No glaucoma.  RESPIRATORY: Denies any cough, wheezing, hemoptysis. Has history of COPD, but not on any inhalers.  CARDIOVASCULAR: Denies any chest pain, orthopnea, edema or arrhythmia. No palpitations. No syncope.  GASTROINTESTINAL: No nausea, vomiting, diarrhea. No abdominal pain. No hematemesis. No melena. No ulcer. No GERD. No IBS. No jaundice.  GENITOURINARY: Denies any dysuria, hematuria, renal calculus or frequency.  ENDOCRINE: Denies any polyuria, nocturia or thyroid problems.  HEMATOLOGIC AND LYMPHATIC: Denies anemia, easy bruisability or bleeding.  SKIN: No acne. No rash. No changes in mole, hair or skin.  MUSCULOSKELETAL: Denies any pain in the neck, back or shoulder.  NEUROLOGIC: No numbness. No CVA. No TIA. No seizures.  PSYCHIATRIC: No anxiety, insomnia or ADD.   PHYSICAL EXAMINATION:  VITAL SIGNS: Temperature 97.9, pulse was 32, respirations 20, blood pressure 181/77, O2 98%.  GENERAL: The patient is a well-developed male. Appears weak.  HEENT: Head normocephalic, atraumatic. Eyes: Pupils equally round and reactive to light and accommodation. There is no conjunctival pallor. No scleral icterus. Nasal exam shows no nasal lesions or drainage. Ears: There is no drainage or external lesions. Mouth: No exudate. No lesions.  NECK: Supple and symmetric. No masses. Thyroid midline, nonenlarged. No JVD.  RESPIRATORY: Good respiratory effort. Clear to auscultation bilaterally without any rales, rhonchi or wheezing.  CARDIOVASCULAR: Regular rate and rhythm. No murmurs, clicks, gallops or heaves. PMI is not displaced.  ABDOMEN: Soft, nontender, nondistended. Positive bowel sounds x4. No hepatosplenomegaly.  GENITOURINARY: Deferred.  MUSCULOSKELETAL: There is no erythema or swelling.  VASCULAR: Good DP, PT pulses.  PSYCHIATRIC: Not anxious or depressed.  SKIN: No rash.   LYMPHATICS: No lymph nodes palpable.  NEUROLOGICAL: Cranial nerves II through XII grossly intact. No focal deficits.  PSYCHIATRIC: Not anxious or depressed.   EVALUATIONS: WBC 10.1, hemoglobin 14.2, platelet count 189. BMP is currently pending. EKG shows third-degree heart block.   ASSESSMENT AND PLAN: The patient is an 81 year old white male with history of peripheral vascular disease, chronic obstructive pulmonary disease, hypertension, not on any treatment for any of these problems, who presents with generalized weakness, noted to have a third-degree heart block.   1. Third-degree heart block. This is a very unstable rhythm. Has been seen by cardiology. The patient will have external pacemaker pads all the time. Based on his heart rate and his symptomatology, he may need to be externally paced. If heart rate does drop significantly, also could use pressors to help with his heart rate. We will admit the patient to the ICU, monitor him very closely. Will follow his electrolytes, including his potassium and magnesium, also check a TSH. The patient will need pacemaker; cardiology to put that in tomorrow.  2. Chronic obstructive pulmonary disease, not on any treatment. Will monitor for any further symptoms. 3. Hypertension, accelerated at this point. In light of his bradycardia, I will use enalapril IV p.r.n., which should not have any effect on the heart rate.  4. Miscellaneous. Will use lower extremity SCDs for deep vein thrombosis prophylaxis.  In light of the patient's significant cardiac heart block, he is at high risk of cardiopulmonary collapse. He is critically ill.  TIME SPENT: 45 minutes spent.    ____________________________ Lacie Scotts. Allena Katz, MD shp:OSi D: 01/30/2013 13:34:04 ET T: 01/30/2013 13:55:37 ET JOB#: [id number]  cc: Apphia Cropley H. Allena Katz, MD, <Dictator> Charise Carwin MD ELECTRONICALLY SIGNED 01/30/2013 14:18

## 2014-09-26 NOTE — Consult Note (Signed)
PATIENT NAME:  Ronald Weber, Ronald Weber MR#:  [id number] DATE OF BIRTH:  [birth year]  DATE OF CONSULTATION:  01/30/2013  REFERRING PHYSICIAN:  Sheryl L. Mindi JunkerGottlieb, MD CONSULTING PHYSICIAN:  Lamar BlinksBruce J. Rosalie Gelpi, MD PRIMARY CARE PHYSICIAN: Marya AmslerMarshall W. Dareen PianoAnderson, MD  REASON FOR CONSULTATION: Complete heart block and hypertension.   CHIEF COMPLAINT: "I'm weak and short of breath."   HISTORY OF PRESENT ILLNESS: This is an 81 year old male with no history of cardiovascular abnormalities who has had new-onset shortness of breath, weakness and fatigue, worsening over the last several weeks, not associated with chest discomfort but some dizziness. The patient has been seen in the Emergency Room with complete heart block with ventricular escape beat of 33 beats per minute. The patient has had reasonable blood pressure in the 170 mmHg range, likely secondary to his low heart rate. He feels fine with no evidence of chest pain, weakness, fatigue and/or other discomfort while lying comfortably.   REVIEW OF SYSTEMS: Remainder of review of systems negative for vision change, ringing in the ears, hearing loss, cough, congestion, heartburn, nausea, vomiting, diarrhea, bloody stool, stomach pain, extremity pain, leg weakness, cramping of the buttocks, known blood clots, headaches, blackouts, nosebleeds, congestion, trouble swallowing, frequent urination, urination at night, muscle weakness, numbness, anxiety, depression, skin lesions, skin rashes.   PAST MEDICAL HISTORY: Borderline hypertension.   FAMILY HISTORY: No family members with early onset of cardiovascular disease.   SOCIAL HISTORY: The patient does have occasional tobacco use and alcohol use.   ALLERGIES: As listed.   MEDICATIONS: As listed.   PHYSICAL EXAMINATION: VITAL SIGNS: Blood pressure is 168/72 bilaterally. Heart rate is 33 upright, reclining, and regular.  GENERAL: He is a well-appearing male in no acute distress.  HEAD, EYES, EARS, NOSE, THROAT: No  icterus, thyromegaly, ulcers, hemorrhage or xanthelasma.  CARDIOVASCULAR: Regular rate and rhythm with normal S1 and S2 without murmur, gallop, rub. PMI is diffuse. Carotid upstroke normal without bruit. Jugular venous pressure is normal.  LUNGS: Have a few basilar crackles with normal respirations.  ABDOMEN: Soft, nontender, without hepatosplenomegaly or masses. Abdominal aorta is normal size without bruit.  EXTREMITIES: Show 2+ bilateral pulses in dorsal pedal, radial and femoral arteries without lower extremity edema, cyanosis, clubbing, ulcers.  NEUROLOGIC: He is oriented to time, place and person with normal mood and affect.   ASSESSMENT: An 81 year old male with borderline hypertension having weakness, fatigue and slow heart rate with new onset of third degree heart block with ventricular escape of 33 beats per minute.   RECOMMENDATIONS: 1.  Continue to monitor for heart rate and avoid temporary pacemaker if necessary if the patient is hemodynamically stable.  2.  Echocardiogram for LV systolic dysfunction and valvular heart disease.  3.  Permanent pacemaker placement with dual-chamber pacemaker for complete heart block. The patient understands the risks and benefits of pacemaker placement, those including the possibly of death, stroke, heart attack, infection, bleeding, blood clot, pneumothorax, hemopericardium and reactions to medications.   ____________________________ Lamar BlinksBruce J. Ociel Retherford, MD bjk:jm D: 01/30/2013 12:52:10 ET Weber: 01/30/2013 14:11:30 ET JOB#: [id number]  cc: Lamar BlinksBruce J. Cyriah Childrey, MD, <Dictator> Lamar BlinksBRUCE J Garvin Ellena MD ELECTRONICALLY SIGNED 02/13/2013 14:25

## 2014-10-03 DIAGNOSIS — M2390 Unspecified internal derangement of unspecified knee: Secondary | ICD-10-CM | POA: Insufficient documentation

## 2014-10-07 ENCOUNTER — Ambulatory Visit: Payer: Medicare Other | Admitting: Podiatry

## 2015-01-29 ENCOUNTER — Encounter: Payer: Self-pay | Admitting: Podiatry

## 2015-01-29 ENCOUNTER — Ambulatory Visit (INDEPENDENT_AMBULATORY_CARE_PROVIDER_SITE_OTHER): Payer: Medicare Other | Admitting: Podiatry

## 2015-01-29 VITALS — BP 85/58 | HR 43 | Resp 18

## 2015-01-29 DIAGNOSIS — M10072 Idiopathic gout, left ankle and foot: Secondary | ICD-10-CM

## 2015-01-29 DIAGNOSIS — M109 Gout, unspecified: Secondary | ICD-10-CM

## 2015-01-29 MED ORDER — PREDNISONE 10 MG PO TABS: ORAL_TABLET | ORAL | Status: DC

## 2015-01-29 NOTE — Progress Notes (Signed)
Patient ID: Ronald Weber, male   DOB: Oct 02, 1928, 81 y.o.   MRN: [id number]  Subjective: 81 year old male presents the office today stating that he has a recurrence of gout which started yesterday. He states that his right foot is become swollen and red has painted with pressure to the area. Denies any history of injury or trauma. Denies any systemic complaints such as fevers, chills, nausea, vomiting. No other complaints at this time in no acute changes.  Objective: AAO 3, NAD DP/PT pulses palpable, CRT less than 3 seconds Protective sensation intact with Simms Weinstein monofilament There is moderate edema to the right foot and there is diffuse erythema along the midfoot as well as the first MTPJ and medial midfoot. There is diffuse tenderness along the dorsal aspect of the foot. Ankle, subtalar joint range of motion is intact. MTPJ range of motion is limited due to pain. There is no open lesions or pre-ulcerative lesions. There is no areas of fluctuance or crepitus. There is no ascending cellulitis. No pain with calf compression, swelling, warmth, erythema.  Assessment: 81 year old male with recurrent right gout flare.  Plan: -Treatment options discussed including all alternatives, risks, and complications -Discussed etiology of his symptoms -At this time he is requesting a steroid injection. Under sterile conditions a total of 2 mL of a mixture of Kenalog 10, 0.5% Marcaine plain and 2% lidocaine plain was infiltrated around the area of maximal tenderness in the dorsal aspect of the right foot. Postinjection care was discussed the patient. He tolerated the injection well without any complications. -Prescribed prednisone taper -Ordered a uric acid level, ESR, CRP -Discussed with him that we need to start some long-term treatment given the multiple recurrent gout attacks within the last year. We will start this was as acute flares resolved. -Follow-up in 2 weeks or sooner if any problems arise.  In the meantime, encouraged to call the office with any questions, concerns, change in symptoms.   Celesta Gentile, DPM

## 2015-01-31 LAB — URIC ACID: URIC ACID: 8.1 mg/dL (ref 3.7–8.6)

## 2015-01-31 LAB — C-REACTIVE PROTEIN: CRP: 77.7 mg/L — AB (ref 0.0–4.9)

## 2015-01-31 LAB — SEDIMENTATION RATE: SED RATE: 3 mm/h (ref 0–30)

## 2015-02-02 ENCOUNTER — Telehealth: Payer: Self-pay | Admitting: *Deleted

## 2015-02-02 NOTE — Telephone Encounter (Addendum)
-----   Message from Vivi Barrack, DPM sent at 02/02/2015  9:24 AM EDT ----- Please let him know that is uric as was normal but his inflammation markers are elevated. I still think this is gout. Thanks. I informed pt of Dr. Gabriel Rung statement and to continue the medication as directed.

## 2015-02-12 ENCOUNTER — Ambulatory Visit: Payer: Medicare Other | Admitting: Podiatry

## 2015-03-31 DIAGNOSIS — I739 Peripheral vascular disease, unspecified: Secondary | ICD-10-CM | POA: Insufficient documentation

## 2015-07-15 DIAGNOSIS — F331 Major depressive disorder, recurrent, moderate: Secondary | ICD-10-CM | POA: Insufficient documentation

## 2015-08-05 ENCOUNTER — Ambulatory Visit (INDEPENDENT_AMBULATORY_CARE_PROVIDER_SITE_OTHER): Payer: Medicare Other

## 2015-08-05 ENCOUNTER — Ambulatory Visit (INDEPENDENT_AMBULATORY_CARE_PROVIDER_SITE_OTHER): Payer: Medicare Other | Admitting: Podiatry

## 2015-08-05 ENCOUNTER — Encounter: Payer: Self-pay | Admitting: Podiatry

## 2015-08-05 VITALS — BP 140/96 | HR 108 | Resp 16

## 2015-08-05 DIAGNOSIS — M79671 Pain in right foot: Secondary | ICD-10-CM | POA: Diagnosis not present

## 2015-08-05 DIAGNOSIS — M109 Gout, unspecified: Secondary | ICD-10-CM

## 2015-08-05 DIAGNOSIS — M779 Enthesopathy, unspecified: Secondary | ICD-10-CM

## 2015-08-05 DIAGNOSIS — M10072 Idiopathic gout, left ankle and foot: Secondary | ICD-10-CM | POA: Diagnosis not present

## 2015-08-05 MED ORDER — PREDNISONE 10 MG PO TABS: ORAL_TABLET | ORAL | Status: DC

## 2015-08-06 NOTE — Progress Notes (Signed)
Mr. Trueheart presents today chief complaint red-hot swollen further right side. He states that it was not as bad as the last gout attack but it felt pretty much the same.  Objective: Vital signs are stable he is alert and oriented 3. Pulses are palpable. His red-hot swollen lateral aspect of his right foot. Radiographs do not demonstrate any type of fracture.  Assessment: Gouty arthritis capsulitis lateral aspect of the right foot.  Plan: Injected the area today with Kenalog and local anesthetic placed him on a Medrol Dosepak will follow-up with him as needed.

## 2015-08-18 ENCOUNTER — Encounter: Payer: Self-pay | Admitting: Urology

## 2015-08-18 ENCOUNTER — Ambulatory Visit (INDEPENDENT_AMBULATORY_CARE_PROVIDER_SITE_OTHER): Payer: Medicare Other | Admitting: Urology

## 2015-08-18 DIAGNOSIS — N400 Enlarged prostate without lower urinary tract symptoms: Secondary | ICD-10-CM | POA: Diagnosis not present

## 2015-08-18 DIAGNOSIS — R32 Unspecified urinary incontinence: Secondary | ICD-10-CM | POA: Diagnosis not present

## 2015-08-18 LAB — MICROSCOPIC EXAMINATION: BACTERIA UA: NONE SEEN

## 2015-08-18 LAB — URINALYSIS, COMPLETE
Bilirubin, UA: NEGATIVE
Glucose, UA: NEGATIVE
Ketones, UA: NEGATIVE
LEUKOCYTES UA: NEGATIVE
Nitrite, UA: NEGATIVE
PH UA: 5.5 (ref 5.0–7.5)
Protein, UA: NEGATIVE
Specific Gravity, UA: 1.02 (ref 1.005–1.030)
Urobilinogen, Ur: 1 mg/dL (ref 0.2–1.0)

## 2015-08-18 LAB — BLADDER SCAN AMB NON-IMAGING: Scan Result: 13

## 2015-08-18 MED ORDER — TAMSULOSIN HCL 0.4 MG PO CAPS: 0.4000 mg | ORAL_CAPSULE | Freq: Every day | ORAL | Status: DC

## 2015-08-18 NOTE — Progress Notes (Signed)
08/18/2015 3:58 PM   Ronald Weber [birth year] [id number]  Referring provider: Lauro Regulus, MD [address] [facility] [place], [place] [address]  Chief Complaint  Patient presents with  . New Patient (Initial Visit)    urinary incontinence    HPI: Ronald Weber is 82yo seen in consultation for urinary urgency and urge incontinence. His symptoms have worsened over the past 8-9 months. He has not noticed anything that makes the incontinence better or worse He has frequency of 1-2 hours, urgency, nocturia 1x and he has occasional nocturnal enuresis. +weak stream He is currently taking mirabegron  and he has not noticed a difference. He has also tried vesicare previously with no results.    PMH: Past Medical History  Diagnosis Date  . Anxiety   . Depression   . Gout     Surgical History: Past Surgical History  Procedure Laterality Date  . Pacemaker insertion      Home Medications:    Medication List       This list is accurate as of: 08/18/15  3:58 PM.  Always use your most recent med list.               acetaminophen 325 MG tablet  Commonly known as:  TYLENOL  Take by mouth.     AMBIEN 5 MG tablet  Generic drug:  zolpidem  Take 5 mg by mouth.     atorvastatin 20 MG tablet  Commonly known as:  LIPITOR     clopidogrel 75 MG tablet  Commonly known as:  PLAVIX  Reported on 08/18/2015     colchicine 0.6 MG tablet  Take 1 tablet (0.6 mg total) by mouth daily.     DULoxetine 30 MG capsule  Commonly known as:  CYMBALTA  Reported on 08/18/2015     escitalopram 5 MG tablet  Commonly known as:  LEXAPRO  Reported on 08/18/2015     HYDROcodone-acetaminophen 5-325 MG tablet  Commonly known as:  NORCO/VICODIN  Take by mouth. Reported on 08/18/2015     hydrOXYzine 25 MG tablet  Commonly known as:  ATARAX/VISTARIL  Take 25 mg by mouth.     MYRBETRIQ 25 MG Tb24 tablet  Generic drug:  mirabegron ER     oxcarbazepine 600 MG tablet    Commonly known as:  TRILEPTAL     predniSONE 10 MG tablet  Commonly known as:  DELTASONE  6 day tapering dose     TOBRADEX ST 0.3-0.05 % Susp  Generic drug:  Tobramycin-Dexamethasone  Reported on 08/18/2015     traMADol 50 MG tablet  Commonly known as:  ULTRAM  Take 50 mg by mouth.     VESICARE 10 MG tablet  Generic drug:  solifenacin  Take by mouth. Reported on 08/18/2015        Allergies:  Allergies  Allergen Reactions  . Colchicine   . Doxycycline Other (See Comments)  . Meloxicam Other (See Comments)  . Sulfa Antibiotics     Other reaction(s): UNKNOWN  . Tussin  [Guaifenesin] Other (See Comments)  . Amoxicillin-Pot Clavulanate Rash    Family History: Family History  Problem Relation Age of Onset  . Hematuria Neg Hx   . Kidney cancer Neg Hx     Social History:  reports that he has never smoked. He does not have any smokeless tobacco history on file. He reports that he drinks alcohol. His drug history is not on file.  ROS: UROLOGY Frequent Urination?:  Yes Hard to postpone urination?: Yes Burning/pain with urination?: No Get up at night to urinate?: No Leakage of urine?: Yes Urine stream starts and stops?: Yes Trouble starting stream?: No Do you have to strain to urinate?: No Blood in urine?: No Urinary tract infection?: No Sexually transmitted disease?: No Injury to kidneys or bladder?: No Painful intercourse?: No Weak stream?: No Erection problems?: No Penile pain?: No  Gastrointestinal Nausea?: No Vomiting?: No Indigestion/heartburn?: No Diarrhea?: No Constipation?: No  Constitutional Fever: No Night sweats?: No Weight loss?: No Fatigue?: No  Skin Skin rash/lesions?: No Itching?: No  Eyes Blurred vision?: No Double vision?: No  Ears/Nose/Throat Sore throat?: No Sinus problems?: No  Hematologic/Lymphatic Swollen glands?: No Easy bruising?: Yes  Cardiovascular Leg swelling?: No Chest pain?: Yes  Respiratory Cough?:  No Shortness of breath?: Yes  Endocrine Excessive thirst?: No  Musculoskeletal Back pain?: Yes Joint pain?: Yes  Neurological Headaches?: No Dizziness?: No  Psychologic Depression?: Yes Anxiety?: Yes  Physical Exam: There were no vitals taken for this visit.  Constitutional:  Alert and oriented, No acute distress. HEENT: [place] AT, moist mucus membranes.  Trachea midline, no masses. Cardiovascular: No clubbing, cyanosis, or edema. Respiratory: Normal respiratory effort, no increased work of breathing. GI: Abdomen is soft, nontender, nondistended, no abdominal masses GU: No CVA tenderness. Uncircumcised phallus, no masses/lesions on penis, testes, scrotum. Prostate 60g smooth, no nodules, no induration Skin: No rashes, bruises or suspicious lesions. Lymph: No cervical or inguinal adenopathy. Neurologic: Grossly intact, no focal deficits, moving all 4 extremities. Psychiatric: Normal mood and affect.  Laboratory Data: Lab Results  Component Value Date   WBC 8.2 08/05/2014   HGB 13.7 08/05/2014   HCT 41.7 08/05/2014   MCV 92 08/05/2014   PLT 207 08/05/2014    Lab Results  Component Value Date   CREATININE 1.36* 01/31/2013    No results found for: PSA  No results found for: TESTOSTERONE  No results found for: HGBA1C  Urinalysis    Component Value Date/Time   COLORURINE Yellow 01/30/2013 1324   APPEARANCEUR Clear 01/30/2013 1324   LABSPEC 1.015 01/30/2013 1324   PHURINE 5.0 01/30/2013 1324   GLUCOSEU Negative 01/30/2013 1324   HGBUR Negative 01/30/2013 1324   BILIRUBINUR Negative 01/30/2013 1324   KETONESUR Negative 01/30/2013 1324   PROTEINUR Negative 01/30/2013 1324   NITRITE Negative 01/30/2013 1324   LEUKOCYTESUR Negative 01/30/2013 1324    Pertinent Imaging: none  Assessment & Plan:    1. Incontinence -flomax 0.4mg  daily D/c mirabegron - Bladder Scan (Post Void Residual) in office - Urinalysis, Complete   No Follow-up on file.  Malen GauzeMCKENZIE,  Timiya Howells L, MD  [facility] [address], [address] [place], [place] [address] [phone]

## 2015-08-18 NOTE — Progress Notes (Signed)
Bladder Scan Patient  void: 13 ml Performed By: K.Russell,CMA 

## 2015-08-19 ENCOUNTER — Telehealth: Payer: Self-pay | Admitting: Urology

## 2015-08-19 NOTE — Telephone Encounter (Signed)
Pt says Dr. Ronne BinningMcKenzie was supposed to call him in something possibly for his prostate.  He wasn't sure which medication it was.   Marchelle FolksAmanda w/[facility].  [phone]

## 2015-08-20 DIAGNOSIS — R32 Unspecified urinary incontinence: Secondary | ICD-10-CM | POA: Insufficient documentation

## 2015-08-20 DIAGNOSIS — N400 Enlarged prostate without lower urinary tract symptoms: Secondary | ICD-10-CM | POA: Insufficient documentation

## 2015-08-20 NOTE — Telephone Encounter (Signed)
Sarah gave the pharmacy a verbal order today for flomax.  Thanks,  Marcelino DusterMichelle

## 2015-08-28 ENCOUNTER — Telehealth: Payer: Self-pay

## 2015-08-28 NOTE — Telephone Encounter (Signed)
Pt called stating he is not able to take flomax due to extreme muscle weakness and fatigue. Made pt aware he would need to be seen again to talk about changing the medication due to Dr. Ronne BinningMcKenzie only being in our office 2 days a month. Pt voiced understanding. Pt was transferred to the front to make f/u appt.

## 2015-08-31 ENCOUNTER — Other Ambulatory Visit: Payer: Self-pay

## 2015-08-31 ENCOUNTER — Telehealth: Payer: Self-pay

## 2015-08-31 ENCOUNTER — Ambulatory Visit (INDEPENDENT_AMBULATORY_CARE_PROVIDER_SITE_OTHER): Payer: Medicare Other | Admitting: Urology

## 2015-08-31 ENCOUNTER — Encounter: Payer: Self-pay | Admitting: Urology

## 2015-08-31 VITALS — BP 134/92 | HR 105 | Ht 68.0 in | Wt 179.0 lb

## 2015-08-31 DIAGNOSIS — R32 Unspecified urinary incontinence: Secondary | ICD-10-CM | POA: Diagnosis not present

## 2015-08-31 LAB — URINALYSIS, COMPLETE
BILIRUBIN UA: NEGATIVE
GLUCOSE, UA: NEGATIVE
Leukocytes, UA: NEGATIVE
Nitrite, UA: NEGATIVE
PH UA: 5 (ref 5.0–7.5)
UUROB: 1 mg/dL (ref 0.2–1.0)

## 2015-08-31 LAB — MICROSCOPIC EXAMINATION

## 2015-08-31 MED ORDER — FESOTERODINE FUMARATE ER 8 MG PO TB24: 8.0000 mg | ORAL_TABLET | Freq: Every day | ORAL | Status: DC

## 2015-08-31 NOTE — Progress Notes (Signed)
08/31/2015 11:31 AM   Ronald Weber 07-03-1928 [id number]  Referring provider: Lauro Regulus, MD [address] [facility] [place], [place] [address]  Chief Complaint  Patient presents with  . Urinary Incontinence    follow up    HPI: The patient is a 82 year old gentleman with urgency incontinence and frequency assessed by Dr. Thea Silversmith. He was on mirror mirabegron last visit.  He was given Flomax on his last visit. Apparently he has failed Vesicare. He had a 60 g benign prostate on rectal examination  He has mild urge incontinence but is not a daily problem. He denies stress incontinence. He intermittently has mild enuresis. He does not wear pads. He voids every 1 to 2R's gets up once at night  He did not respond to the medications listed. His primary care physician has tried him on medication as well in the past  He quit smoking 10 years ago.       PMH: Past Medical History  Diagnosis Date  . Anxiety   . Depression   . Gout     Surgical History: Past Surgical History  Procedure Laterality Date  . Pacemaker insertion      Home Medications:    Medication List       This list is accurate as of: 08/31/15 11:31 AM.  Always use your most recent med list.               acetaminophen 325 MG tablet  Commonly known as:  TYLENOL  Take by mouth.     AMBIEN 5 MG tablet  Generic drug:  zolpidem  Take 5 mg by mouth.     hydrOXYzine 25 MG tablet  Commonly known as:  ATARAX/VISTARIL  Take 25 mg by mouth.     traMADol 50 MG tablet  Commonly known as:  ULTRAM  Take 50 mg by mouth.        Allergies:  Allergies  Allergen Reactions  . Colchicine   . Doxycycline Other (See Comments)  . Meloxicam Other (See Comments)  . Sulfa Antibiotics     Other reaction(s): UNKNOWN  . Tussin  [Guaifenesin] Other (See Comments)  . Amoxicillin-Pot Clavulanate Rash    Family History: Family History  Problem Relation Age of Onset  . Hematuria  Neg Hx   . Kidney cancer Neg Hx     Social History:  reports that he has quit smoking. He does not have any smokeless tobacco history on file. He reports that he drinks alcohol. His drug history is not on file.  ROS: UROLOGY Frequent Urination?: No Hard to postpone urination?: No Burning/pain with urination?: No Get up at night to urinate?: No Leakage of urine?: Yes Urine stream starts and stops?: No Trouble starting stream?: No Do you have to strain to urinate?: No Blood in urine?: No Urinary tract infection?: No Sexually transmitted disease?: No Injury to kidneys or bladder?: No Painful intercourse?: No Weak stream?: No Erection problems?: No Penile pain?: No  Gastrointestinal Nausea?: No Vomiting?: No Indigestion/heartburn?: No Diarrhea?: No Constipation?: No  Constitutional Fever: No Night sweats?: No Weight loss?: No Fatigue?: No  Skin Skin rash/lesions?: No Itching?: No  Eyes Blurred vision?: No Double vision?: No  Ears/Nose/Throat Sore throat?: No Sinus problems?: No  Hematologic/Lymphatic Swollen glands?: No Easy bruising?: No  Cardiovascular Leg swelling?: No Chest pain?: No  Respiratory Cough?: No Shortness of breath?: No  Endocrine Excessive thirst?: No  Musculoskeletal Back pain?: No Joint pain?: No  Neurological Headaches?: No Dizziness?: No  Psychologic Depression?: No Anxiety?: No  Physical Exam: BP 134/92 mmHg  Pulse 105  Ht 5\' 8"  (1.727 m)  Wt 179 lb (81.194 kg)  BMI 27.22 kg/m2   Laboratory Data: Lab Results  Component Value Date   WBC 8.2 08/05/2014   HGB 13.7 08/05/2014   HCT 41.7 08/05/2014   MCV 92 08/05/2014   PLT 207 08/05/2014    Lab Results  Component Value Date   CREATININE 1.36* 01/31/2013    Urinalysis    Component Value Date/Time   COLORURINE Yellow 01/30/2013 1324   APPEARANCEUR Clear 08/18/2015 1530   APPEARANCEUR Clear 01/30/2013 1324   LABSPEC 1.015 01/30/2013 1324   PHURINE 5.0  01/30/2013 1324   GLUCOSEU Negative 08/18/2015 1530   GLUCOSEU Negative 01/30/2013 1324   HGBUR Negative 01/30/2013 1324   BILIRUBINUR Negative 08/18/2015 1530   BILIRUBINUR Negative 01/30/2013 1324   KETONESUR Negative 01/30/2013 1324   PROTEINUR Negative 08/18/2015 1530   PROTEINUR Negative 01/30/2013 1324   NITRITE Negative 08/18/2015 1530   NITRITE Negative 01/30/2013 1324   LEUKOCYTESUR Negative 08/18/2015 1530   LEUKOCYTESUR Negative 01/30/2013 1324    Pertinent Imaging: None  Assessment & Plan:  The patient primarily has intermittent mild urge incontinence and moderate frequency. I explained to him that is primarily having bladder overactivity or bladder spasms and yes it could be due to his outlet or enlarged prostate but I did not recommend surgery for him. He wanted to try once again of branded product so I gave him 1 week of Toviaz samples but then a prescription of 8 mg. I will reassess him in a month. I discussed PTNS in detail.  I did not bring up urodynamics since I don't think it would change the management. I will ask him more about his flow next time. He has never had a cystoscopy   1. Incontinence 2. Urinary frequency - Urinalysis, Complete     Martina SinnerMACDIARMID,Laurajean Hosek A, MD  [facility] [address] [place], [place] [address] [phone]

## 2015-08-31 NOTE — Telephone Encounter (Signed)
The pt came in the office today to see Dr. Sherron MondayMacDiarmid for Incontinence. Urinalysis was done and a urine culture ordered, but the pt did not leave enough urine to do the culture. I called the pt and notified him we need him to come back to the office to give us a sample. He said he will come tomorrow afternoon.

## 2015-09-02 ENCOUNTER — Other Ambulatory Visit: Payer: Medicare Other

## 2015-09-02 DIAGNOSIS — R32 Unspecified urinary incontinence: Secondary | ICD-10-CM

## 2015-09-03 ENCOUNTER — Other Ambulatory Visit: Payer: Self-pay

## 2015-09-04 LAB — CULTURE, URINE COMPREHENSIVE

## 2015-09-15 ENCOUNTER — Ambulatory Visit: Payer: Medicare Other

## 2015-09-28 ENCOUNTER — Ambulatory Visit (INDEPENDENT_AMBULATORY_CARE_PROVIDER_SITE_OTHER): Payer: Medicare Other | Admitting: Urology

## 2015-09-28 ENCOUNTER — Encounter: Payer: Self-pay | Admitting: Urology

## 2015-09-28 VITALS — BP 165/98 | HR 83 | Ht 69.0 in | Wt 187.8 lb

## 2015-09-28 DIAGNOSIS — R32 Unspecified urinary incontinence: Secondary | ICD-10-CM | POA: Diagnosis not present

## 2015-09-28 DIAGNOSIS — N400 Enlarged prostate without lower urinary tract symptoms: Secondary | ICD-10-CM

## 2015-09-28 NOTE — Progress Notes (Signed)
09/28/2015 2:45 PM   Patricia PesaPaul T Keimig Oct 02, 1928 [id number]  Referring provider: Lauro RegulusMarshall W Anderson, MD [address] [facility] [place], [place] [address]  Chief Complaint  Patient presents with  . Follow-up    urinary incontinence    HPI: The patient is a 82 year old gentleman with urgency incontinence and frequency assessed by Dr. Thea SilversmithMackenzie. He was on mirabegron last visit. He was given Flomax on his last visit. Apparently he has failed Vesicare. He had a 60 g benign prostate on rectal examination He has mild urge incontinence but is not a daily problem. He denies stress incontinence. He intermittently has mild enuresis. He does not wear pads. He voids every 1 to 2R's gets up once at night    During his last visit I explained to him that he had an enlarged prostate but I was treating him primarily for bladder spasms. I did not recommend pelvic surgery. He is here on a trial of Toviaz 8 mg. I do not think a urodynamics which changes management. He has never had cystoscopy and last visit had inserted 2 red blood cells per high power field      PMH: Past Medical History  Diagnosis Date  . Anxiety   . Depression   . Gout     Surgical History: Past Surgical History  Procedure Laterality Date  . Pacemaker insertion      Home Medications:    Medication List       This list is accurate as of: 09/28/15  2:45 PM.  Always use your most recent med list.               acetaminophen 325 MG tablet  Commonly known as:  TYLENOL  Take by mouth.     AMBIEN 5 MG tablet  Generic drug:  zolpidem  Take 5 mg by mouth.     fesoterodine 8 MG Tb24 tablet  Commonly known as:  TOVIAZ  Take 1 tablet (8 mg total) by mouth daily.     hydrOXYzine 25 MG tablet  Commonly known as:  ATARAX/VISTARIL  Take 25 mg by mouth.     predniSONE 10 MG tablet  Commonly known as:  DELTASONE     predniSONE 10 MG (48) Tbpk tablet  Commonly known as:  STERAPRED UNI-PAK 48 TAB     traMADol 50 MG tablet  Commonly known as:  ULTRAM  Take 50 mg by mouth.        Allergies:  Allergies  Allergen Reactions  . Colchicine   . Doxycycline Other (See Comments)  . Meloxicam Other (See Comments)  . Sulfa Antibiotics     Other reaction(s): UNKNOWN  . Tussin  [Guaifenesin] Other (See Comments)  . Amoxicillin-Pot Clavulanate Rash    Family History: Family History  Problem Relation Age of Onset  . Hematuria Neg Hx   . Kidney cancer Neg Hx     Social History:  reports that he has quit smoking. He does not have any smokeless tobacco history on file. He reports that he drinks alcohol. His drug history is not on file.  ROS:                                        Physical Exam: There were no vitals taken for this visit.    Laboratory Data: Lab Results  Component Value Date   WBC 8.2 08/05/2014  HGB 13.7 08/05/2014   HCT 41.7 08/05/2014   MCV 92 08/05/2014   PLT 207 08/05/2014    Lab Results  Component Value Date   CREATININE 1.36* 01/31/2013     Urinalysis    Component Value Date/Time   COLORURINE Yellow 01/30/2013 1324   APPEARANCEUR Clear 08/31/2015 1045   APPEARANCEUR Clear 01/30/2013 1324   LABSPEC 1.015 01/30/2013 1324   PHURINE 5.0 01/30/2013 1324   GLUCOSEU Negative 08/31/2015 1045   GLUCOSEU Negative 01/30/2013 1324   HGBUR Negative 01/30/2013 1324   BILIRUBINUR Negative 08/31/2015 1045   BILIRUBINUR Negative 01/30/2013 1324   KETONESUR Negative 01/30/2013 1324   PROTEINUR 2+* 08/31/2015 1045   PROTEINUR Negative 01/30/2013 1324   NITRITE Negative 08/31/2015 1045   NITRITE Negative 01/30/2013 1324   LEUKOCYTESUR Negative 08/31/2015 1045   LEUKOCYTESUR Negative 01/30/2013 1324    Pertinent Imaging: none  Assessment & Plan:  The patient is 60% improved her better. He still has a little bit of foot on the floor urgency but overall greatly improved and frequency and urgency incontinence. Going from a sitting  to standing position as a trigger  We have talked about percutaneous tibial nerve stimulation. Reassess on Toviaz in 4 months and he is reasonably pleased  There are no diagnoses linked to this encounter.  No Follow-up on file.  Martina Sinner, MD  [facility] [address] [place], [place] [address] [phone]

## 2015-09-29 LAB — URINALYSIS, COMPLETE
BILIRUBIN UA: NEGATIVE
Glucose, UA: NEGATIVE
LEUKOCYTES UA: NEGATIVE
Nitrite, UA: NEGATIVE
PH UA: 5 (ref 5.0–7.5)
Protein, UA: NEGATIVE
Specific Gravity, UA: 1.02 (ref 1.005–1.030)
Urobilinogen, Ur: 0.2 mg/dL (ref 0.2–1.0)

## 2015-09-29 LAB — MICROSCOPIC EXAMINATION: Bacteria, UA: NONE SEEN

## 2015-09-30 NOTE — Telephone Encounter (Signed)
The pt came back and gave us a urine sample the next day.

## 2016-01-06 ENCOUNTER — Ambulatory Visit (INDEPENDENT_AMBULATORY_CARE_PROVIDER_SITE_OTHER): Payer: Medicare Other | Admitting: Urology

## 2016-01-06 ENCOUNTER — Encounter: Payer: Self-pay | Admitting: Urology

## 2016-01-06 VITALS — BP 131/83 | HR 80 | Ht 70.0 in | Wt 183.2 lb

## 2016-01-06 DIAGNOSIS — N3946 Mixed incontinence: Secondary | ICD-10-CM | POA: Diagnosis not present

## 2016-01-06 NOTE — Progress Notes (Signed)
01/06/2016 3:10 PM   Ronald Weber [birth year] [id number]  Referring provider: Lauro Regulus, MD [address] [facility] [place], [place] [address]  Chief Complaint  Patient presents with  . Follow-up    incontinence    HPI: The patient is a 82 year old gentleman with urgency incontinence and frequency assessed by Dr. Thea Silversmith. He was on mirabegron last visit. He was given Flomax on his last visit. Apparently he has failed Vesicare. He had a 60 g benign prostate on rectal examination He has mild urge incontinence but is not a daily problem. He denies stress incontinence. He intermittently has mild enuresis. He does not wear pads. He voids every 1 to 2R's gets up once at night    During his last visit I explained to him that he had an enlarged prostate but I was treating him primarily for bladder spasms. I did not recommend pelvic surgery. He is here on a trial of Toviaz 8 mg. I do not think a urodynamics which changes management. He has never had cystoscopy and last visit had inserted 2 red blood cells per high power fieldFlomax  The patient had been 60% better with less foot on the floor syndrome and less frequency and urge incontinence on Toviaz. Percutaneous tibial nerve stimulation was discussed last time in April  Today  The benefits a Toviaz have worn off. He still has urge incontinence and frequency. Clinically is not infected. He's never had blood in the urine  He wants to try one more pill but otherwise try percutaneous tibial nerve stimulation  PMH: Past Medical History:  Diagnosis Date  . Anxiety   . Depression   . Gout     Surgical History: Past Surgical History:  Procedure Laterality Date  . PACEMAKER INSERTION      Home Medications:    Medication List       Accurate as of 01/06/16  3:10 PM. Always use your most recent med list.          acetaminophen 325 MG tablet Commonly known as:  TYLENOL Take by mouth.   AMBIEN 5 MG  tablet Generic drug:  zolpidem Take 5 mg by mouth.   escitalopram 5 MG tablet Commonly known as:  LEXAPRO Take 5 mg by mouth daily.   fesoterodine 8 MG Tb24 tablet Commonly known as:  TOVIAZ Take 1 tablet (8 mg total) by mouth daily.   hydrOXYzine 25 MG tablet Commonly known as:  ATARAX/VISTARIL Take 25 mg by mouth.   predniSONE 10 MG tablet Commonly known as:  DELTASONE Reported on 09/28/2015   predniSONE 10 MG (48) Tbpk tablet Commonly known as:  STERAPRED UNI-PAK 48 TAB Reported on 09/28/2015   traMADol 50 MG tablet Commonly known as:  ULTRAM Take 50 mg by mouth.       Allergies:  Allergies  Allergen Reactions  . Colchicine   . Doxycycline Other (See Comments)  . Meloxicam Other (See Comments)  . Sulfa Antibiotics     Other reaction(s): UNKNOWN  . Tussin  [Guaifenesin] Other (See Comments)  . Amoxicillin-Pot Clavulanate Rash    Family History: Family History  Problem Relation Age of Onset  . Hematuria Neg Hx   . Kidney cancer Neg Hx     Social History:  reports that he has quit smoking. He has never used smokeless tobacco. He reports that he drinks alcohol. His drug history is not on file.  ROS: UROLOGY Frequent Urination?: Yes Hard to postpone urination?: No  Burning/pain with urination?: No Get up at night to urinate?: No Leakage of urine?: No Urine stream starts and stops?: No Trouble starting stream?: No Do you have to strain to urinate?: No Blood in urine?: No Urinary tract infection?: No Sexually transmitted disease?: No Injury to kidneys or bladder?: No Painful intercourse?: No Weak stream?: No Erection problems?: No Penile pain?: No  Gastrointestinal Nausea?: No Vomiting?: No Indigestion/heartburn?: No Diarrhea?: No Constipation?: No  Constitutional Fever: No Night sweats?: No Weight loss?: No Fatigue?: No  Skin Skin rash/lesions?: No Itching?: No  Eyes Blurred vision?: No Double vision?: No  Ears/Nose/Throat Sore  throat?: No Sinus problems?: No  Hematologic/Lymphatic Swollen glands?: No Easy bruising?: No  Cardiovascular Leg swelling?: No Chest pain?: No  Respiratory Cough?: No Shortness of breath?: No  Endocrine Excessive thirst?: No  Musculoskeletal Back pain?: No Joint pain?: No  Neurological Headaches?: No Dizziness?: No  Psychologic Depression?: No Anxiety?: No  Physical Exam: BP 131/83 (BP Location: Left Arm, Patient Position: Sitting, Cuff Size: Normal)   Pulse 80   Ht 5\' 10"  (1.778 m)   Wt 183 lb 3.2 oz (83.1 kg)   BMI 26.29 kg/m     Laboratory Data: Lab Results  Component Value Date   WBC 8.2 08/05/2014   HGB 13.7 08/05/2014   HCT 41.7 08/05/2014   MCV 92 08/05/2014   PLT 207 08/05/2014    Lab Results  Component Value Date   CREATININE 1.36 (H) 01/31/2013    No results found for: PSA  No results found for: TESTOSTERONE  No results found for: HGBA1C  Urinalysis    Component Value Date/Time   COLORURINE Yellow 01/30/2013 1324   APPEARANCEUR Clear 09/28/2015 1451   LABSPEC 1.015 01/30/2013 1324   PHURINE 5.0 01/30/2013 1324   GLUCOSEU Negative 09/28/2015 1451   GLUCOSEU Negative 01/30/2013 1324   HGBUR Negative 01/30/2013 1324   BILIRUBINUR Negative 09/28/2015 1451   BILIRUBINUR Negative 01/30/2013 1324   KETONESUR Negative 01/30/2013 1324   PROTEINUR Negative 09/28/2015 1451   PROTEINUR Negative 01/30/2013 1324   NITRITE Negative 09/28/2015 1451   NITRITE Negative 01/30/2013 1324   LEUKOCYTESUR Negative 09/28/2015 1451   LEUKOCYTESUR Negative 01/30/2013 1324    Pertinent Imaging: None  Assessment & Plan:  Oxybutynin ER 10 mg 3011 given. Call in 3 weeks if he wants percutaneous tibial nerve stimulation  There are no diagnoses linked to this encounter.  No Follow-up on file.  Martina Sinner, MD  [facility] [address], [address] [place], [place] [address] [phone] 5 weeks here by1 month

## 2016-02-03 ENCOUNTER — Other Ambulatory Visit: Payer: Self-pay | Admitting: *Deleted

## 2016-02-03 MED ORDER — METHYLPREDNISOLONE 4 MG PO TBPK: ORAL_TABLET | ORAL | 0 refills | Status: DC

## 2016-02-03 NOTE — Telephone Encounter (Signed)
Patient requesting medrol dose pack until he can get in next week for his appointment. Sent over to [facility].

## 2016-05-03 ENCOUNTER — Telehealth: Payer: Self-pay | Admitting: *Deleted

## 2016-05-03 ENCOUNTER — Other Ambulatory Visit: Payer: Self-pay | Admitting: Podiatry

## 2016-05-03 NOTE — Telephone Encounter (Signed)
Ok to refill 

## 2016-05-03 NOTE — Telephone Encounter (Signed)
Yes that will be fine at this point.

## 2016-05-03 NOTE — Telephone Encounter (Signed)
Pt request refill of Colchicine.

## 2016-05-03 NOTE — Telephone Encounter (Addendum)
Pt request refill of Prednisone for his gout. Unable to leave a message requesting pt make an appt, voicemail box is not set up. 05/04/2016-Informed pt Dr. Al CorpusHyatt had refilled the Prednisone.

## 2016-05-04 MED ORDER — METHYLPREDNISOLONE 4 MG PO TBPK: ORAL_TABLET | ORAL | 0 refills | Status: DC

## 2016-05-04 NOTE — Telephone Encounter (Signed)
Refill predinisone and colchicine.

## 2016-05-06 NOTE — Addendum Note (Signed)
Addended by: Alphia Kava'CONNELL, Kayleena Eke D on: 05/06/2016 11:33 AM   Modules accepted: Orders

## 2016-05-19 ENCOUNTER — Other Ambulatory Visit: Payer: Self-pay | Admitting: Podiatry

## 2016-05-20 NOTE — Telephone Encounter (Signed)
Patient will need follow up for future refills

## 2016-07-28 ENCOUNTER — Ambulatory Visit (INDEPENDENT_AMBULATORY_CARE_PROVIDER_SITE_OTHER): Payer: Medicare Other | Admitting: Urology

## 2016-07-28 ENCOUNTER — Encounter: Payer: Self-pay | Admitting: Urology

## 2016-07-28 ENCOUNTER — Telehealth: Payer: Self-pay

## 2016-07-28 ENCOUNTER — Telehealth: Payer: Self-pay | Admitting: *Deleted

## 2016-07-28 VITALS — BP 127/86 | HR 84 | Ht 69.0 in | Wt 182.2 lb

## 2016-07-28 DIAGNOSIS — N400 Enlarged prostate without lower urinary tract symptoms: Secondary | ICD-10-CM | POA: Diagnosis not present

## 2016-07-28 DIAGNOSIS — N3941 Urge incontinence: Secondary | ICD-10-CM

## 2016-07-28 NOTE — Telephone Encounter (Signed)
Spoke with the patient about his PTNS appointments and clearance patient  is ok with the plan.

## 2016-07-28 NOTE — Progress Notes (Signed)
07/28/2016 11:31 AM   Patricia PesaPaul T Uppal 09-08-1928 [id number]  Referring provider: Lauro RegulusMarshall W Anderson, MD [address] [facility] [place], [place] [id number]  Chief Complaint  Patient presents with  . Follow-up    urge incontinence     HPI: The patient is a 83 year old gentleman with urgency incontinence and frequency assessed by Dr. Thea SilversmithMackenzie. He was on mirabegron last visit. He was given Flomax on his last visit. Apparently he has failed Vesicare. He had a 60 g benign prostate on rectal examination He has mild urge incontinence but is not a daily problem. He denies stress incontinence. He intermittently has mild enuresis. He does not wear pads. He voids every 1 to 2R's gets up once at night  During his last visit I explained to him that he had an enlarged prostate but I was treating him primarily for bladder spasms. I did not recommend pelvic surgery. He is here on a trial of Toviaz 8 mg. I do not think a urodynamics which changes management. He has never had cystoscopy and last visit had inserted 2 red blood cells per high power fieldFlomax  The patient had been 60% better with less foot on the floor syndrome and less frequency and urge incontinence on Toviaz. Percutaneous tibial nerve stimulation was discussed last time in April   The last visit the Gala Murdochoviaz was no longer working and he wanted to try one more medication. I gave him oxybutynin ER 10 mg. He was going to think about percutaneous tibial nerve stimulation  Today Frequency stable The patient still has urgency incontinence. He failed oxybutynin as well area did   PMH: Past Medical History:  Diagnosis Date  . Anxiety   . Depression   . Gout     Surgical History: Past Surgical History:  Procedure Laterality Date  . PACEMAKER INSERTION      Home Medications:  Allergies as of 07/28/2016      Reactions   Colchicine    Doxycycline Other (See Comments)   Meloxicam Other (See Comments)   Sulfa  Antibiotics    Other reaction(s): UNKNOWN   Tussin  [guaifenesin] Other (See Comments)   Amoxicillin-pot Clavulanate Rash      Medication List       Accurate as of 07/28/16 11:31 AM. Always use your most recent med list.          acetaminophen 325 MG tablet Commonly known as:  TYLENOL Take by mouth.   AMBIEN 5 MG tablet Generic drug:  zolpidem Take 5 mg by mouth.   Colchicine 0.6 MG Caps TAKE ONE CAPSULE DAILY   escitalopram 5 MG tablet Commonly known as:  LEXAPRO Take 5 mg by mouth daily.   fesoterodine 8 MG Tb24 tablet Commonly known as:  TOVIAZ Take 1 tablet (8 mg total) by mouth daily.   hydrOXYzine 25 MG tablet Commonly known as:  ATARAX/VISTARIL Take 25 mg by mouth.   methylPREDNISolone 4 MG Tbpk tablet Commonly known as:  MEDROL DOSEPAK 6 day dose pack - take as directed   oxybutynin 10 MG 24 hr tablet Commonly known as:  DITROPAN-XL   traMADol 50 MG tablet Commonly known as:  ULTRAM Take 50 mg by mouth.       Allergies:  Allergies  Allergen Reactions  . Colchicine   . Doxycycline Other (See Comments)  . Meloxicam Other (See Comments)  . Sulfa Antibiotics     Other reaction(s): UNKNOWN  . Tussin  [Guaifenesin] Other (See Comments)  .  Amoxicillin-Pot Clavulanate Rash    Family History: Family History  Problem Relation Age of Onset  . Hematuria Neg Hx   . Kidney cancer Neg Hx     Social History:  reports that he has quit smoking. He has never used smokeless tobacco. He reports that he drinks alcohol. His drug history is not on file.  ROS: UROLOGY Frequent Urination?: Yes Hard to postpone urination?: Yes Burning/pain with urination?: No Get up at night to urinate?: No Leakage of urine?: Yes Urine stream starts and stops?: No Trouble starting stream?: No Do you have to strain to urinate?: No Blood in urine?: No Urinary tract infection?: No Sexually transmitted disease?: No Injury to kidneys or bladder?: No Painful intercourse?:  No Weak stream?: No Erection problems?: No Penile pain?: No  Gastrointestinal Nausea?: No Vomiting?: No Indigestion/heartburn?: No Diarrhea?: No Constipation?: No  Constitutional Fever: No Night sweats?: No Weight loss?: No Fatigue?: No  Skin Skin rash/lesions?: No Itching?: No  Eyes Blurred vision?: No Double vision?: No  Ears/Nose/Throat Sore throat?: No Sinus problems?: No  Hematologic/Lymphatic Swollen glands?: No Easy bruising?: No  Cardiovascular Leg swelling?: No Chest pain?: No  Respiratory Cough?: No Shortness of breath?: No  Endocrine Excessive thirst?: No  Musculoskeletal Back pain?: Yes Joint pain?: No  Neurological Headaches?: No Dizziness?: No  Psychologic Depression?: No Anxiety?: No  Physical Exam: BP 127/86   Pulse 84   Ht 5\' 9"  (1.753 m)   Wt 82.6 kg (182 lb 3.2 oz)   BMI 26.91 kg/m   Constitutional:  Alert and oriented, No acute distress.   Laboratory Data: Lab Results  Component Value Date   WBC 8.2 08/05/2014   HGB 13.7 08/05/2014   HCT 41.7 08/05/2014   MCV 92 08/05/2014   PLT 207 08/05/2014    Lab Results  Component Value Date   CREATININE 1.36 (H) 01/31/2013    No results found for: PSA  No results found for: TESTOSTERONE  No results found for: HGBA1C  Urinalysis    Component Value Date/Time   COLORURINE Yellow 01/30/2013 1324   APPEARANCEUR Clear 09/28/2015 1451   LABSPEC 1.015 01/30/2013 1324   PHURINE 5.0 01/30/2013 1324   GLUCOSEU Negative 09/28/2015 1451   GLUCOSEU Negative 01/30/2013 1324   HGBUR Negative 01/30/2013 1324   BILIRUBINUR Negative 09/28/2015 1451   BILIRUBINUR Negative 01/30/2013 1324   KETONESUR Negative 01/30/2013 1324   PROTEINUR Negative 09/28/2015 1451   PROTEINUR Negative 01/30/2013 1324   NITRITE Negative 09/28/2015 1451   NITRITE Negative 01/30/2013 1324   LEUKOCYTESUR Negative 09/28/2015 1451   LEUKOCYTESUR Negative 01/30/2013 1324    Pertinent  Imaging: none  Assessment & Plan:  Patient will be scheduled for percutaneous tibial nerve stimulation. We did not have other good options for him. Hopefully he will reach his treatment goal  1. Benign fibroma of prostate 2. Urgency incontinence - PSA   No Follow-up on file.  Martina Sinner, MD  [facility] [address] [place], [place] [address] [phone]

## 2016-07-28 NOTE — Addendum Note (Signed)
Addended by: Lonna CobbUSSELL, KELITA L on: 07/28/2016 11:44 AM   Modules accepted: Orders

## 2016-07-28 NOTE — Telephone Encounter (Signed)
LMOM for patient to call office back about his PTNS appointments. Patient will start on wednesdays at 1:30 pm for twelve weeks on March 7th if we get clearance from cardiologist for pacemaker.

## 2016-07-28 NOTE — Telephone Encounter (Signed)
Pt needs cardiac clearance because of his pacemaker for Percutaneous tibial nerve stimulation. The treatment is scheduled for Wednesday, March 7th. Please advise

## 2016-07-29 NOTE — Telephone Encounter (Signed)
Received Clearance from cardiology, called patient to let him know that he is cleared to start the PTNS procedures on March 7th. Patient ok with the plan.

## 2016-07-31 ENCOUNTER — Emergency Department: Payer: Medicare Other

## 2016-07-31 ENCOUNTER — Emergency Department
Admission: EM | Admit: 2016-07-31 | Discharge: 2016-07-31 | Disposition: A | Discharge status: Home / Self Care | Payer: Medicare Other | Attending: Emergency Medicine | Admitting: Emergency Medicine

## 2016-07-31 DIAGNOSIS — Z79899 Other long term (current) drug therapy: Secondary | ICD-10-CM | POA: Insufficient documentation

## 2016-07-31 DIAGNOSIS — Z950 Presence of cardiac pacemaker: Secondary | ICD-10-CM | POA: Insufficient documentation

## 2016-07-31 DIAGNOSIS — Z87891 Personal history of nicotine dependence: Secondary | ICD-10-CM | POA: Insufficient documentation

## 2016-07-31 DIAGNOSIS — R42 Dizziness and giddiness: Secondary | ICD-10-CM

## 2016-07-31 DIAGNOSIS — I10 Essential (primary) hypertension: Secondary | ICD-10-CM | POA: Diagnosis not present

## 2016-07-31 DIAGNOSIS — M7989 Other specified soft tissue disorders: Secondary | ICD-10-CM

## 2016-07-31 LAB — URINALYSIS, COMPLETE (UACMP) WITH MICROSCOPIC
BILIRUBIN URINE: NEGATIVE
Bacteria, UA: NONE SEEN
GLUCOSE, UA: NEGATIVE mg/dL
Ketones, ur: NEGATIVE mg/dL
LEUKOCYTES UA: NEGATIVE
NITRITE: NEGATIVE
PROTEIN: NEGATIVE mg/dL
SPECIFIC GRAVITY, URINE: 1.011 (ref 1.005–1.030)
pH: 5 (ref 5.0–8.0)

## 2016-07-31 LAB — COMPREHENSIVE METABOLIC PANEL
ALK PHOS: 56 U/L (ref 38–126)
ALT: 20 U/L (ref 17–63)
ANION GAP: 7 (ref 5–15)
AST: 28 U/L (ref 15–41)
Albumin: 3.7 g/dL (ref 3.5–5.0)
BILIRUBIN TOTAL: 0.3 mg/dL (ref 0.3–1.2)
BUN: 19 mg/dL (ref 6–20)
CALCIUM: 8.5 mg/dL — AB (ref 8.9–10.3)
CO2: 25 mmol/L (ref 22–32)
Chloride: 106 mmol/L (ref 101–111)
Creatinine, Ser: 1.28 mg/dL — ABNORMAL HIGH (ref 0.61–1.24)
GFR calc non Af Amer: 49 mL/min — ABNORMAL LOW (ref >60)
GFR, EST AFRICAN AMERICAN: 57 mL/min — AB (ref >60)
GLUCOSE: 134 mg/dL — AB (ref 65–99)
POTASSIUM: 4.2 mmol/L (ref 3.5–5.1)
Sodium: 138 mmol/L (ref 135–145)
TOTAL PROTEIN: 6.6 g/dL (ref 6.5–8.1)

## 2016-07-31 LAB — CBC WITH DIFFERENTIAL/PLATELET
Basophils Absolute: 0.1 10*3/uL (ref 0–0.1)
Basophils Relative: 1 %
EOS ABS: 0 10*3/uL (ref 0–0.7)
Eosinophils Relative: 1 %
HCT: 40 % (ref 40.0–52.0)
HEMOGLOBIN: 13.3 g/dL (ref 13.0–18.0)
Lymphocytes Relative: 17 %
Lymphs Abs: 1 10*3/uL (ref 1.0–3.6)
MCH: 30.2 pg (ref 26.0–34.0)
MCHC: 33.4 g/dL (ref 32.0–36.0)
MCV: 90.6 fL (ref 80.0–100.0)
MONOS PCT: 8 %
Monocytes Absolute: 0.5 10*3/uL (ref 0.2–1.0)
NEUTROS ABS: 4.4 10*3/uL (ref 1.4–6.5)
NEUTROS PCT: 73 %
Platelets: 209 10*3/uL (ref 150–440)
RBC: 4.41 MIL/uL (ref 4.40–5.90)
RDW: 15.4 % — ABNORMAL HIGH (ref 11.5–14.5)
WBC: 6 10*3/uL (ref 3.8–10.6)

## 2016-07-31 LAB — TROPONIN I

## 2016-07-31 MED ORDER — HYDROCHLOROTHIAZIDE 12.5 MG PO TABS: 12.5000 mg | ORAL_TABLET | Freq: Every day | ORAL | 0 refills | Status: DC

## 2016-07-31 MED ORDER — HYDROCHLOROTHIAZIDE 25 MG PO TABS
12.5000 mg | ORAL_TABLET | Freq: Once | ORAL | Status: AC
  Administered 2016-07-31: 12.5 mg via ORAL
  Filled 2016-07-31: qty 1

## 2016-07-31 NOTE — ED Notes (Signed)
Pacemaker interrogated. [employer] report faxed and given to MD.

## 2016-07-31 NOTE — ED Provider Notes (Signed)
[facility] Emergency Department Provider Note  ____________________________________________   First MD Initiated Contact with Patient 07/31/16 1615     (approximate)  I have reviewed the triage vital signs and the nursing notes.   HISTORY  Chief Complaint Dizziness and Hypertension    HPI Ronald Weber is a 83 y.o. male who comes to the emergency department via EMS for fatigue and hypertension. He has a history of pacemaker placement secondary to symptomatic bradycardia. He denies history of myocardial infarction or congestive heart failure.Today he felt rundown and fatigued and checked his blood pressure home and it was high. He then walked up to a fire department and requested the check his blood pressure and it was reportedly 200/100 and she was sweating sleigh advised he come to the emergency department. He is currently asymptomatic. He never had chest pain or shortness of breath. He denies vertigo.   03/31/16 Cards note:  1. PVD (peripheral vascular disease) (CMS-HCC)  2. Essential hypertension  3. Simple chronic bronchitis (CMS-HCC)  4. Sleep disorder  5. SOB (shortness of breath) on exertion  6. Pacemaker   83 year old male with essential hypertension, blood pressure in normal range on diet therapy. The patient is complaining of worsening exertional dyspnea and generalized fatigue. 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 Pacemaker interrogation today revealed normal functioning dual-chamber pacemaker.   Past Medical History:  Diagnosis Date  . Anxiety   . Depression   . Gout     Patient Active Problem List   Diagnosis Date Noted  . Incontinence 08/20/2015  . BPH (benign prostatic hyperplasia) 08/20/2015  . Moderate episode of recurrent major depressive disorder (HCC) 07/15/2015  . Peripheral vascular disease (HCC) 03/31/2015  . Derangement of knee 10/03/2014  . Acute gout 08/06/2014  . Acute  gouty arthritis 08/06/2014  . B12 deficiency 04/07/2014  . Absence of sensation 04/07/2014  . Disordered sleep 04/07/2014  . Fothergill's neuralgia 04/07/2014  . DDD (degenerative disc disease), lumbar 10/11/2013  . Neuritis or radiculitis due to rupture of lumbar intervertebral disc 10/11/2013  . Degenerative arthritis of lumbar spine 10/11/2013  . CAFL (chronic airflow limitation) (HCC) 09/12/2013  . BP (high blood pressure) 09/12/2013  . Chronic obstructive pulmonary disease (HCC) 09/12/2013  . Artificial cardiac pacemaker 01/30/2013  . Benign prostatic hyperplasia with urinary obstruction 04/06/2012  . Benign fibroma of prostate 04/06/2012  . Abnormal prostate specific antigen 04/06/2012  . FOM (frequency of micturition) 04/06/2012  . Elevated prostate specific antigen (PSA) 04/06/2012    Past Surgical History:  Procedure Laterality Date  . PACEMAKER INSERTION      Prior to Admission medications   Medication Sig Start Date End Date Taking? Authorizing Provider  acetaminophen (TYLENOL) 325 MG tablet Take by mouth.   Yes Historical Provider, MD  Colchicine 0.6 MG CAPS TAKE ONE CAPSULE DAILY 05/20/16  Yes Max T Hyatt, DPM  hydrOXYzine (ATARAX/VISTARIL) 25 MG tablet Take 25 mg by mouth at bedtime.  04/06/12  Yes Historical Provider, MD  predniSONE (DELTASONE) 5 MG tablet Take 5 mg by mouth as needed.   Yes Historical Provider, MD  traMADol (ULTRAM) 50 MG tablet Take 50 mg by mouth every 6 (six) hours as needed.  04/06/12  Yes Historical Provider, MD  zolpidem (AMBIEN) 5 MG tablet Take 5 mg by mouth at bedtime.  04/06/12  Yes Historical Provider, MD  fesoterodine (TOVIAZ) 8 MG TB24 tablet Take 1 tablet (8 mg total) by mouth daily. Patient not taking:  Reported on 07/28/2016 08/31/15   Alfredo Martinez, MD  hydrochlorothiazide (HYDRODIURIL) 12.5 MG tablet Take 1 tablet (12.5 mg total) by mouth daily. 07/31/16 08/30/16  Merrily Brittle, MD  methylPREDNISolone (MEDROL DOSEPAK) 4 MG TBPK tablet  6 day dose pack - take as directed Patient not taking: Reported on 07/28/2016 05/04/16   Max T Hyatt, DPM    Allergies Colchicine; Doxycycline; Meloxicam; Sulfa antibiotics; Tussin  [guaifenesin]; and Amoxicillin-pot clavulanate  Family History  Problem Relation Age of Onset  . Hematuria Neg Hx   . Kidney cancer Neg Hx     Social History Social History  Substance Use Topics  . Smoking status: Former Games developer  . Smokeless tobacco: Never Used  . Alcohol use 0.0 oz/week    Review of Systems Constitutional: No fever/chillsPositive for fatigue Eyes: No visual changes. ENT: No sore throat. Cardiovascular: Denies chest pain. Respiratory: Denies shortness of breath. Gastrointestinal: No abdominal pain.  No nausea, no vomiting.  No diarrhea.  No constipation. Genitourinary: Negative for dysuria. Musculoskeletal: Negative for back pain. Positive for leg pain and swelling Skin: Negative for rash. Neurological: Negative for headaches, focal weakness or numbness.  10-point ROS otherwise negative.  ____________________________________________   PHYSICAL EXAM:  VITAL SIGNS: ED Triage Vitals  Enc Vitals Group     BP      Pulse      Resp      Temp      Temp src      SpO2      Weight      Height      Head Circumference      Peak Flow      Pain Score      Pain Loc      Pain Edu?      Excl. in GC?     Constitutional: Alert and oriented. Well appearing and in no acute distress. Eyes: Conjunctivae are normal. PERRL. EOMI. Head: Atraumatic. Nose: No congestion/rhinnorhea. Mouth/Throat: Mucous membranes are moist.  Oropharynx non-erythematous. Neck: No stridor.   Cardiovascular: Normal rate, regular rhythm. Grossly normal heart sounds.  Good peripheral circulation. Respiratory: Normal respiratory effort.  No retractions. Lungs CTAB. Gastrointestinal: Soft and nontender. No distention. No abdominal bruits. No CVA tenderness. Musculoskeletal: No leg edema or erythema  appreciated Neurologic:  Normal speech and language. No gross focal neurologic deficits are appreciated. No gait instability. Skin:  Skin is warm, dry and intact. No rash noted. Psychiatric: Mood and affect are normal. Speech and behavior are normal.  ____________________________________________   LABS (all labs ordered are listed, but only abnormal results are displayed)  Labs Reviewed  COMPREHENSIVE METABOLIC PANEL - Abnormal; Notable for the following:       Result Value   Glucose, Bld 134 (*)    Creatinine, Ser 1.28 (*)    Calcium 8.5 (*)    GFR calc non Af Amer 49 (*)    GFR calc Af Amer 57 (*)    All other components within normal limits  CBC WITH DIFFERENTIAL/PLATELET - Abnormal; Notable for the following:    RDW 15.4 (*)    All other components within normal limits  URINALYSIS, COMPLETE (UACMP) WITH MICROSCOPIC - Abnormal; Notable for the following:    Color, Urine YELLOW (*)    APPearance CLEAR (*)    Hgb urine dipstick SMALL (*)    Squamous Epithelial / LPF 0-5 (*)    All other components within normal limits  TROPONIN I   ____________________________________________  EKG  ED ECG REPORT I,  Merrily Brittle, the attending physician, personally viewed and interpreted this ECG.  Date: 07/31/2016 Rate: 72 Rhythm: normal sinus rhythm QRS Axis: Leftward Intervals: normal ST/T Wave abnormalities: normal Conduction Disturbances: none Narrative Interpretation ventricular paced rhythm with no Sgarbosa criteria ___________________________________  RADIOLOGY  No evidence of DVT ____________________________________________   PROCEDURES  Procedure(s) performed: no  Procedures  Critical Care performed: no  ____________________________________________   INITIAL IMPRESSION / ASSESSMENT AND PLAN / ED COURSE  Pertinent labs & imaging results that were available during my care of the patient were reviewed by me and considered in my medical decision making (see  chart for details).  ----------------------------------------- 4:21 PM on 07/31/2016 -----------------------------------------  On arrival the patient is extremely well-appearing with unremarkable vitals and exam aside from hypertension. He has a [employer] pacemaker so we will interrogated now as well as check broad labs. He is currently not on any antihypertensives and his blood pressure is quite high. No indication to acutely lowered at this point.    ----------------------------------------- 5:20 PM on 07/31/2016 -----------------------------------------  The patient's pacer was interrogated and shows no concerning dysrhythmias.  ----------------------------------------- 6:32 PM on 07/31/2016 -----------------------------------------  I was on my way into discharge the patient when he and his son told me that the patient has had painful swelling to his left lower extremity for the last several weeks and they're concerned he may have a deep vein thrombus. Ultrasound pending.  ----------------------------------------- 7:30 PM on 07/31/2016 -----------------------------------------  Fortunately the patient's ultrasound was negative. His blood pressure remains slightly elevated so I will initiate him on hydrochlorothiazide and he can follow up with his primary care physician tomorrow. ____________________________________________   FINAL CLINICAL IMPRESSION(S) / ED DIAGNOSES  Final diagnoses:  Hypertension, unspecified type  Lightheadedness      NEW MEDICATIONS STARTED DURING THIS VISIT:  New Prescriptions   HYDROCHLOROTHIAZIDE (HYDRODIURIL) 12.5 MG TABLET    Take 1 tablet (12.5 mg total) by mouth daily.     Note:  This document was prepared using Dragon voice recognition software and may include unintentional dictation errors.     Merrily Brittle, MD 07/31/16 [phone]

## 2016-07-31 NOTE — Discharge Instructions (Signed)
Please follow-up with your primary care physician tomorrow for recheck. If you are unable to see her primary care physician please fill the prescription for blood pressure medication that I have prescribed you today. Primary care physician may choose to start you on a different agent instead.  Return to the emergency department sooner for any new or worsening symptoms such as fevers, chills, chest pain, shortness breath, or for any other concerns.  [place] Venous Img Lower Bilateral  Result Date: 07/31/2016 CLINICAL DATA:  Bilateral lower extremity edema, indeterminate time period. Former smoker. EXAM: BILATERAL LOWER EXTREMITY VENOUS DOPPLER ULTRASOUND TECHNIQUE: Gray-scale sonography with graded compression, as well as color Doppler and duplex ultrasound were performed to evaluate the lower extremity deep venous systems from the level of the common femoral vein and including the common femoral, femoral, profunda femoral, popliteal and calf veins including the posterior tibial, peroneal and gastrocnemius veins when visible. The superficial great saphenous vein was also interrogated. Spectral Doppler was utilized to evaluate flow at rest and with distal augmentation maneuvers in the common femoral, femoral and popliteal veins. COMPARISON:  None. FINDINGS: RIGHT LOWER EXTREMITY Common Femoral Vein: No evidence of thrombus. Normal compressibility, respiratory phasicity and response to augmentation. Saphenofemoral Junction: No evidence of thrombus. Normal compressibility and flow on color Doppler imaging. Profunda Femoral Vein: No evidence of thrombus. Normal compressibility and flow on color Doppler imaging. Femoral Vein: No evidence of thrombus. Normal compressibility, respiratory phasicity and response to augmentation. Popliteal Vein: No evidence of thrombus. Normal compressibility, respiratory phasicity and response to augmentation. Calf Veins: No evidence of thrombus. Normal compressibility and flow on color  Doppler imaging. Superficial Great Saphenous Vein: No evidence of thrombus. Normal compressibility and flow on color Doppler imaging. Venous Reflux:  None. Other Findings:  Calcific atherosclerosis of the adjacent artery's. LEFT LOWER EXTREMITY Common Femoral Vein: No evidence of thrombus. Normal compressibility, respiratory phasicity and response to augmentation. Saphenofemoral Junction: No evidence of thrombus. Normal compressibility and flow on color Doppler imaging. Profunda Femoral Vein: No evidence of thrombus. Normal compressibility and flow on color Doppler imaging. Femoral Vein: No evidence of thrombus. Normal compressibility, respiratory phasicity and response to augmentation. Popliteal Vein: No evidence of thrombus. Normal compressibility, respiratory phasicity and response to augmentation. Calf Veins: No evidence of thrombus. Normal compressibility and flow on color Doppler imaging. Superficial Great Saphenous Vein: No evidence of thrombus. Normal compressibility and flow on color Doppler imaging. Venous Reflux:  None. Other Findings:  Calcific atherosclerosis of the adjacent artery's. IMPRESSION: No evidence of deep venous thrombosis in either lower extremity. Calcific atherosclerosis of the included arteries, incompletely characterized. Electronically Signed   By: Awilda Metroourtnay  Bloomer M.D.   On: 07/31/2016 19:11   Dg Chest Port 1 View  Result Date: 07/31/2016 CLINICAL DATA:  Dizziness, hypertension and weakness. EXAM: PORTABLE CHEST 1 VIEW COMPARISON:  02/01/2013 FINDINGS: Left chest wall pacer device is noted with lead in the right atrial appendage and right ventricle. The heart size and mediastinal contours are within normal limits. Both lungs are clear. The visualized skeletal structures are unremarkable. IMPRESSION: 1. No acute cardiopulmonary abnormalities. Electronically Signed   By: Signa Kellaylor  Stroud M.D.   On: 07/31/2016 16:35

## 2016-07-31 NOTE — ED Notes (Signed)
Pt returned to room from ultrasound.

## 2016-07-31 NOTE — ED Triage Notes (Signed)
Pt came to ED via EMS c/o dizziness, htn, weakness. EMS reports pt was diaphoretic. Pt alert and oriented. Has pacemaker. BP 194/100.

## 2016-08-10 ENCOUNTER — Ambulatory Visit (INDEPENDENT_AMBULATORY_CARE_PROVIDER_SITE_OTHER): Payer: Medicare Other | Admitting: Urology

## 2016-08-10 ENCOUNTER — Encounter: Payer: Self-pay | Admitting: Urology

## 2016-08-10 ENCOUNTER — Ambulatory Visit: Payer: Medicare Other | Admitting: Urology

## 2016-08-10 VITALS — BP 163/90 | HR 87 | Ht 70.0 in | Wt 183.1 lb

## 2016-08-10 DIAGNOSIS — N3941 Urge incontinence: Secondary | ICD-10-CM | POA: Diagnosis not present

## 2016-08-10 NOTE — Progress Notes (Signed)
Chief Complaint:  Chief Complaint  Patient presents with  . PTNS    1     HPI: 83 yo WM who presents today to begin a 12 weekly treatments of PTNS.  Today is 1/12.  The patient with urgency incontinence and frequency assessed by Dr. Thea Silversmith.  He was on mirabegron last visit. He was given Flomax on his last visit. Apparently he has failed Vesicare. He had a 60 g benign prostate on rectal examination He has mild urge incontinence but is not a daily problem. He denies stress incontinence. He intermittently has mild enuresis. He does not wear pads. He voids every 1 to 2R's gets up once at night  During his last visit I explained to him that he had an enlarged prostate but I was treating him primarily for bladder spasms. I did not recommend pelvic surgery. He is here on a trial of Toviaz 8 mg. I do not think a urodynamics which changes management. He has never had cystoscopy and last visit had inserted 2 red blood cells per high power field  The patient had been 60% better with less foot on the floor syndrome and less frequency and urge incontinence on Toviaz. Percutaneous tibial nerve stimulation was discussed last time in April   The last visit the Gala Murdoch was no longer working and he wanted to try one more medication. I gave him oxybutynin ER 10 mg. He was going to think about percutaneous tibial nerve stimulation  Today Frequency stable The patient still has urgency incontinence. He failed oxybutynin as well area did          Previous Therapy:            Pelvic Floor Exercises - not attempted           Biofeedback - not attempted           Bladder Training - not attempted           Fluid Management - not attempted           Dietary Restrictions - not attempted    Contraindications present for PTNS      Pacemaker - YES - patient has cardiac clearance      Implantable defibrillator - NO       History of abnormal bleeding - NO      History of neuropathies or nerve damage  - NO  Discussed with patient possible complications of procedure, such as discomfort, bleeding at insertion/stimulation site, procedure consent signed  Patient goals:     To stop having to go to the bathroom every time he puts a key in the door.    Today, the patient is complaining of frequency, urgency and incontinence.  He denies dysuria, gross hematuria and suprapubic.  He fevers, chills, nausea and vomiting.    Starting symptoms, 8 day time voids, 0 night time voids and zero incontinence episodes daily.     PMH: Past Medical History:  Diagnosis Date  . Anxiety   . Depression   . Gout     Surgical History: Past Surgical History:  Procedure Laterality Date  . PACEMAKER INSERTION      Home Medications:  Allergies as of 08/10/2016      Reactions   Colchicine    Doxycycline Other (See Comments)   Meloxicam Other (See Comments)   Sulfa Antibiotics    Other reaction(s): UNKNOWN   Tussin  [guaifenesin] Other (See Comments)   Amoxicillin-pot Clavulanate Rash  Medication List       Accurate as of 08/10/16  2:10 PM. Always use your most recent med list.          acetaminophen 325 MG tablet Commonly known as:  TYLENOL Take by mouth.   AMBIEN 5 MG tablet Generic drug:  zolpidem Take 5 mg by mouth at bedtime.   Colchicine 0.6 MG Caps TAKE ONE CAPSULE DAILY   fesoterodine 8 MG Tb24 tablet Commonly known as:  TOVIAZ Take 1 tablet (8 mg total) by mouth daily.   hydrochlorothiazide 12.5 MG tablet Commonly known as:  HYDRODIURIL Take 1 tablet (12.5 mg total) by mouth daily.   hydrOXYzine 25 MG tablet Commonly known as:  ATARAX/VISTARIL Take 25 mg by mouth at bedtime.   losartan 50 MG tablet Commonly known as:  COZAAR Take by mouth.   methylPREDNISolone 4 MG Tbpk tablet Commonly known as:  MEDROL DOSEPAK 6 day dose pack - take as directed   predniSONE 5 MG tablet Commonly known as:  DELTASONE Take 5 mg by mouth as needed.   traMADol 50 MG  tablet Commonly known as:  ULTRAM Take 50 mg by mouth every 6 (six) hours as needed.       Allergies:  Allergies  Allergen Reactions  . Colchicine   . Doxycycline Other (See Comments)  . Meloxicam Other (See Comments)  . Sulfa Antibiotics     Other reaction(s): UNKNOWN  . Tussin  [Guaifenesin] Other (See Comments)  . Amoxicillin-Pot Clavulanate Rash    Family History: Family History  Problem Relation Age of Onset  . Hematuria Neg Hx   . Kidney cancer Neg Hx     Social History:  reports that he has quit smoking. He has never used smokeless tobacco. He reports that he drinks alcohol. His drug history is not on file.  ROS: UROLOGY Frequent Urination?: Yes Hard to postpone urination?: Yes Burning/pain with urination?: No Get up at night to urinate?: No Leakage of urine?: Yes Urine stream starts and stops?: No Trouble starting stream?: No Do you have to strain to urinate?: No Blood in urine?: No Urinary tract infection?: No Sexually transmitted disease?: No Injury to kidneys or bladder?: No Painful intercourse?: No Weak stream?: No Erection problems?: No Penile pain?: No  Gastrointestinal Nausea?: No Vomiting?: No Indigestion/heartburn?: No Diarrhea?: No Constipation?: No  Constitutional Fever: No Night sweats?: No Weight loss?: No Fatigue?: No  Skin Skin rash/lesions?: No Itching?: No  Eyes Blurred vision?: No Double vision?: No  Ears/Nose/Throat Sore throat?: No Sinus problems?: No  Hematologic/Lymphatic Swollen glands?: No Easy bruising?: No  Cardiovascular Leg swelling?: No Chest pain?: No  Respiratory Cough?: No Shortness of breath?: No  Endocrine Excessive thirst?: No  Musculoskeletal Back pain?: Yes Joint pain?: Yes  Neurological Headaches?: No Dizziness?: No  Psychologic Depression?: No Anxiety?: No   Physical Exam: BP (!) 163/90   Pulse 87   Ht 5\' 10"  (1.778 m)   Wt 183 lb 1.6 oz (83.1 kg)   BMI 26.27 kg/m    Constitutional: Well nourished. Alert and oriented, No acute distress. HEENT: [place] AT, moist mucus membranes. Trachea midline, no masses. Cardiovascular: No clubbing, cyanosis, or edema. Respiratory: Normal respiratory effort, no increased work of breathing. Skin: No rashes, bruises or suspicious lesions. Lymph: No cervical or inguinal adenopathy. Neurologic: Grossly intact, no focal deficits, moving all 4 extremities. Psychiatric: Normal mood and affect.  Laboratory Data: Lab Results  Component Value Date   WBC 6.0 07/31/2016   HGB 13.3 07/31/2016  HCT 40.0 07/31/2016   MCV 90.6 07/31/2016   PLT 209 07/31/2016    Lab Results  Component Value Date   CREATININE 1.28 (H) 07/31/2016     Lab Results  Component Value Date   TSH 4.33 01/31/2013       Component Value Date/Time   CHOL 126 01/31/2013 0505   HDL 33 (L) 01/31/2013 0505   VLDL 34 01/31/2013 0505   LDLCALC 59 01/31/2013 0505    Lab Results  Component Value Date   AST 28 07/31/2016   Lab Results  Component Value Date   ALT 20 07/31/2016    PTNS treatment: The needle electrode was inserted into the lower, inner aspect of the patient's left leg. The surface electrode was placed on the inside arch of the foot on the treatment leg. The lead set was connected to the stimulator and the needle electrode clip was connected to the needle electrode. The stimulator that produces an adjustable electrical pulse that travels to the sacral nerve plexus via the tibial nerve was increased to 2 until the patient received a toe flex and sensory response.     Assessment & Plan:   1. Urge incontinence  - Treatment Plan:  The needle electrode was removed without difficulty to the patient.  Patient tolerated the procedure for 30 minutes.  She will return next week for # 2 out of 12 of their weekly PTNS treatment's    Return in about 1 week (around 08/17/2016) for # 2 PTNS.  These notes generated with voice recognition  software. I apologize for typographical errors.  Michiel Cowboy, PA-C  [facility] [address] [place], [place] [address] [phone]

## 2016-08-10 NOTE — Progress Notes (Signed)
PTNS  Session # 1  Health & Social Factors:  Caffeine: 2 Alcohol: 4 Daytime voids #per day: 8 Night-time voids #per night: 0 Urgency: severe Incontinence Episodes #per day: 0 Ankle used: left Treatment Setting: 2 Feeling/ Response: sensory   Preformed By: Apolinar JunesBrandon PA student, Michiel CowboyShannon McGowan, PA

## 2016-08-16 NOTE — Progress Notes (Signed)
Chief Complaint:  Chief Complaint  Patient presents with  . PTNS    Urge Incontinence     HPI: 83 yo WM who presents today to begin a 12 weekly treatments of PTNS.  Today is # 2/12.  The patient with urgency incontinence and frequency assessed by Dr. Thea SilversmithMackenzie.  He was on mirabegron last visit. He was given Flomax on his last visit. Apparently he has failed Vesicare. He had a 60 g benign prostate on rectal examination He has mild urge incontinence but is not a daily problem. He denies stress incontinence. He intermittently has mild enuresis. He does not wear pads. He voids every 1 to 2R's gets up once at night  During his last visit I explained to him that he had an enlarged prostate but I was treating him primarily for bladder spasms. I did not recommend pelvic surgery. He is here on a trial of Toviaz 8 mg. I do not think a urodynamics which changes management. He has never had cystoscopy and last visit had inserted 2 red blood cells per high power field  The patient had been 60% better with less foot on the floor syndrome and less frequency and urge incontinence on Toviaz. Percutaneous tibial nerve stimulation was discussed last time in April   The last visit the Gala Murdochoviaz was no longer working and he wanted to try one more medication. I gave him oxybutynin ER 10 mg. He was going to think about percutaneous tibial nerve stimulation  Today Frequency stable The patient still has urgency incontinence. He failed oxybutynin as well area did          Previous Therapy:            Pelvic Floor Exercises - not attempted           Biofeedback - not attempted           Bladder Training - not attempted           Fluid Management - not attempted           Dietary Restrictions - not attempted    Contraindications present for PTNS      Pacemaker - YES - patient has cardiac clearance      Implantable defibrillator - NO       History of abnormal bleeding - NO      History of  neuropathies or nerve damage - NO  Discussed with patient possible complications of procedure, such as discomfort, bleeding at insertion/stimulation site, procedure consent signed  Patient goals:     To stop having to go to the bathroom every time he puts a key in the door.    Today, the patient is complaining of frequency, urgency and incontinence.  He denies dysuria, gross hematuria and suprapubic.  He fevers, chills, nausea and vomiting.    Starting symptoms, 8 day time voids, 0 night time voids and zero incontinence episodes daily.  After his initial treatment, he is experiencing 12 daytime voids, 0 night time voids and 0 incontinence episodes.     PMH: Past Medical History:  Diagnosis Date  . Anxiety   . Depression   . Gout     Surgical History: Past Surgical History:  Procedure Laterality Date  . HERNIA REPAIR    . PACEMAKER INSERTION      Home Medications:  Allergies as of 08/17/2016      Reactions   Colchicine    Doxycycline Other (See Comments)   Meloxicam Other (See  Comments)   Sulfa Antibiotics    Other reaction(s): UNKNOWN   Tussin  [guaifenesin] Other (See Comments)   Amoxicillin-pot Clavulanate Rash      Medication List       Accurate as of 08/17/16  1:43 PM. Always use your most recent med list.          acetaminophen 325 MG tablet Commonly known as:  TYLENOL Take by mouth.   AMBIEN 5 MG tablet Generic drug:  zolpidem Take 5 mg by mouth at bedtime.   Colchicine 0.6 MG Caps TAKE ONE CAPSULE DAILY   fesoterodine 8 MG Tb24 tablet Commonly known as:  TOVIAZ Take 1 tablet (8 mg total) by mouth daily.   hydrochlorothiazide 12.5 MG tablet Commonly known as:  HYDRODIURIL Take 1 tablet (12.5 mg total) by mouth daily.   hydrOXYzine 25 MG tablet Commonly known as:  ATARAX/VISTARIL Take 25 mg by mouth at bedtime.   losartan 50 MG tablet Commonly known as:  COZAAR Take by mouth.   methylPREDNISolone 4 MG Tbpk tablet Commonly known as:  MEDROL  DOSEPAK 6 day dose pack - take as directed   predniSONE 5 MG tablet Commonly known as:  DELTASONE Take 5 mg by mouth as needed.   traMADol 50 MG tablet Commonly known as:  ULTRAM Take 50 mg by mouth every 6 (six) hours as needed.       Allergies:  Allergies  Allergen Reactions  . Colchicine   . Doxycycline Other (See Comments)  . Meloxicam Other (See Comments)  . Sulfa Antibiotics     Other reaction(s): UNKNOWN  . Tussin  [Guaifenesin] Other (See Comments)  . Amoxicillin-Pot Clavulanate Rash    Family History: Family History  Problem Relation Age of Onset  . Hematuria Neg Hx   . Kidney cancer Neg Hx   . Prostate cancer Neg Hx   . Bladder Cancer Neg Hx     Social History:  reports that he has been smoking Pipe.  He has never used smokeless tobacco. He reports that he drinks alcohol. He reports that he does not use drugs.  ROS: UROLOGY Frequent Urination?: Yes Hard to postpone urination?: Yes Burning/pain with urination?: No Get up at night to urinate?: Yes Leakage of urine?: No Urine stream starts and stops?: No Trouble starting stream?: No Do you have to strain to urinate?: No Blood in urine?: No Urinary tract infection?: No Sexually transmitted disease?: No Injury to kidneys or bladder?: No Painful intercourse?: No Weak stream?: No Erection problems?: No Penile pain?: No  Gastrointestinal Nausea?: No Vomiting?: No Indigestion/heartburn?: No Diarrhea?: No Constipation?: No  Constitutional Fever: No Night sweats?: No Weight loss?: No Fatigue?: No  Skin Skin rash/lesions?: No Itching?: No  Eyes Blurred vision?: No Double vision?: No  Ears/Nose/Throat Sore throat?: No Sinus problems?: No  Hematologic/Lymphatic Swollen glands?: No Easy bruising?: No  Cardiovascular Leg swelling?: No Chest pain?: No  Respiratory Cough?: No Shortness of breath?: No  Endocrine Excessive thirst?: No  Musculoskeletal Back pain?: No Joint pain?:  No  Neurological Headaches?: No Dizziness?: No  Psychologic Depression?: No Anxiety?: No   Physical Exam: BP (!) 164/103   Pulse 94   Ht 5\' 11"  (1.803 m)   Wt 184 lb 11.2 oz (83.8 kg)   BMI 25.76 kg/m   Constitutional: Well nourished. Alert and oriented, No acute distress. HEENT: [place] AT, moist mucus membranes. Trachea midline, no masses. Cardiovascular: No clubbing, cyanosis, or edema. Respiratory: Normal respiratory effort, no increased work of breathing. Skin:  No rashes, bruises or suspicious lesions. Lymph: No cervical or inguinal adenopathy. Neurologic: Grossly intact, no focal deficits, moving all 4 extremities. Psychiatric: Normal mood and affect.  Laboratory Data: Lab Results  Component Value Date   WBC 6.0 07/31/2016   HGB 13.3 07/31/2016   HCT 40.0 07/31/2016   MCV 90.6 07/31/2016   PLT 209 07/31/2016    Lab Results  Component Value Date   CREATININE 1.28 (H) 07/31/2016     Lab Results  Component Value Date   TSH 4.33 01/31/2013       Component Value Date/Time   CHOL 126 01/31/2013 0505   HDL 33 (L) 01/31/2013 0505   VLDL 34 01/31/2013 0505   LDLCALC 59 01/31/2013 0505    Lab Results  Component Value Date   AST 28 07/31/2016   Lab Results  Component Value Date   ALT 20 07/31/2016    PTNS treatment: The needle electrode was inserted into the lower, inner aspect of the patient's left leg. The surface electrode was placed on the inside arch of the foot on the treatment leg. The lead set was connected to the stimulator and the needle electrode clip was connected to the needle electrode. The stimulator that produces an adjustable electrical pulse that travels to the sacral nerve plexus via the tibial nerve was increased to 3 until the patient received a toe flex and sensory response.     Assessment & Plan:   1. Urge incontinence  - Treatment Plan:  The needle electrode was removed without difficulty to the patient.  Patient tolerated the  procedure for 30 minutes.  She will return next week for # 3 out of 12 of their weekly PTNS treatment's   Return for # 3 PTNS.  These notes generated with voice recognition software. I apologize for typographical errors.  Michiel Cowboy, PA-C  [facility] [address] [place], [place] [address] [phone]

## 2016-08-17 ENCOUNTER — Encounter: Payer: Self-pay | Admitting: Urology

## 2016-08-17 ENCOUNTER — Ambulatory Visit (INDEPENDENT_AMBULATORY_CARE_PROVIDER_SITE_OTHER): Payer: Medicare Other | Admitting: Urology

## 2016-08-17 VITALS — BP 164/103 | HR 94 | Ht 71.0 in | Wt 184.7 lb

## 2016-08-17 DIAGNOSIS — N3941 Urge incontinence: Secondary | ICD-10-CM

## 2016-08-17 LAB — PTNS-PERCUTANEOUS TIBIAL NERVE STIMULATION: SCAN RESULT: 3

## 2016-08-23 NOTE — Progress Notes (Signed)
Chief Complaint:  Chief Complaint  Patient presents with  . PTNS    Urinary Incontinence     HPI: 83 yo WM who presents today to begin a 12 weekly treatments of PTNS.  Today is # 3/12.  The patient with urgency incontinence and frequency assessed by Dr. Thea Silversmith.  He was on mirabegron last visit. He was given Flomax on his last visit. Apparently he has failed Vesicare. He had a 60 g benign prostate on rectal examination He has mild urge incontinence but is not a daily problem. He denies stress incontinence. He intermittently has mild enuresis. He does not wear pads. He voids every 1 to 2R's gets up once at night  During his last visit I explained to him that he had an enlarged prostate but I was treating him primarily for bladder spasms. I did not recommend pelvic surgery. He is here on a trial of Toviaz 8 mg. I do not think a urodynamics which changes management. He has never had cystoscopy and last visit had inserted 2 red blood cells per high power field  The patient had been 60% better with less foot on the floor syndrome and less frequency and urge incontinence on Toviaz. Percutaneous tibial nerve stimulation was discussed last time in April   The last visit the Gala Murdoch was no longer working and he wanted to try one more medication. I gave him oxybutynin ER 10 mg.   Today Frequency stable The patient still has urgency incontinence. He failed oxybutynin as well area did          Previous Therapy:            Pelvic Floor Exercises - not attempted           Biofeedback - not attempted           Bladder Training - not attempted           Fluid Management - not attempted           Dietary Restrictions - not attempted    Contraindications present for PTNS      Pacemaker - YES - patient has cardiac clearance      Implantable defibrillator - NO       History of abnormal bleeding - NO      History of neuropathies or nerve damage - NO  Discussed with patient possible  complications of procedure, such as discomfort, bleeding at insertion/stimulation site, procedure consent signed  Patient goals:     To stop having to go to the bathroom every time he puts a key in the door.    Today, the patient is complaining of frequency and urgency.  He denies dysuria, gross hematuria and suprapubic.  He denies fevers, chills, nausea and vomiting.    Starting symptoms, 8 day time voids, 0 night time voids and zero incontinence episodes daily.  After his second treatment, he is experiencing 12 daytime voids (worsening), 0 night time voids (stable) and 0 incontinence episodes. (stable).     PMH: Past Medical History:  Diagnosis Date  . Anxiety   . Depression   . Gout     Surgical History: Past Surgical History:  Procedure Laterality Date  . HERNIA REPAIR    . PACEMAKER INSERTION      Home Medications:  Allergies as of 08/24/2016      Reactions   Colchicine    Doxycycline Other (See Comments)   Meloxicam Other (See Comments)   Sulfa Antibiotics  Other reaction(s): UNKNOWN   Tussin  [guaifenesin] Other (See Comments)   Amoxicillin-pot Clavulanate Rash      Medication List       Accurate as of 08/24/16  1:45 PM. Always use your most recent med list.          acetaminophen 325 MG tablet Commonly known as:  TYLENOL Take by mouth.   AMBIEN 5 MG tablet Generic drug:  zolpidem Take 5 mg by mouth at bedtime.   Colchicine 0.6 MG Caps TAKE ONE CAPSULE DAILY   fesoterodine 8 MG Tb24 tablet Commonly known as:  TOVIAZ Take 1 tablet (8 mg total) by mouth daily.   hydrochlorothiazide 12.5 MG tablet Commonly known as:  HYDRODIURIL Take 1 tablet (12.5 mg total) by mouth daily.   hydrOXYzine 25 MG tablet Commonly known as:  ATARAX/VISTARIL Take 25 mg by mouth at bedtime.   losartan 50 MG tablet Commonly known as:  COZAAR Take by mouth.   methylPREDNISolone 4 MG Tbpk tablet Commonly known as:  MEDROL DOSEPAK 6 day dose pack - take as directed    predniSONE 5 MG tablet Commonly known as:  DELTASONE Take 5 mg by mouth as needed.   traMADol 50 MG tablet Commonly known as:  ULTRAM Take 50 mg by mouth every 6 (six) hours as needed.       Allergies:  Allergies  Allergen Reactions  . Colchicine   . Doxycycline Other (See Comments)  . Meloxicam Other (See Comments)  . Sulfa Antibiotics     Other reaction(s): UNKNOWN  . Tussin  [Guaifenesin] Other (See Comments)  . Amoxicillin-Pot Clavulanate Rash    Family History: Family History  Problem Relation Age of Onset  . Hematuria Neg Hx   . Kidney cancer Neg Hx   . Prostate cancer Neg Hx   . Bladder Cancer Neg Hx     Social History:  reports that he has been smoking Cigars.  He has never used smokeless tobacco. He reports that he drinks alcohol. He reports that he does not use drugs.  ROS: UROLOGY Frequent Urination?: Yes Hard to postpone urination?: Yes Burning/pain with urination?: No Get up at night to urinate?: No Leakage of urine?: No Urine stream starts and stops?: No Trouble starting stream?: No Do you have to strain to urinate?: No Blood in urine?: No Urinary tract infection?: No Sexually transmitted disease?: No Injury to kidneys or bladder?: No Painful intercourse?: No Weak stream?: No Erection problems?: No Penile pain?: No  Gastrointestinal Nausea?: No Vomiting?: No Indigestion/heartburn?: No Diarrhea?: No Constipation?: No  Constitutional Fever: No Night sweats?: No Weight loss?: No Fatigue?: No  Skin Skin rash/lesions?: No Itching?: No  Eyes Blurred vision?: No Double vision?: No  Ears/Nose/Throat Sore throat?: No Sinus problems?: No  Hematologic/Lymphatic Swollen glands?: No Easy bruising?: No  Cardiovascular Leg swelling?: No Chest pain?: No  Respiratory Cough?: No Shortness of breath?: No  Endocrine Excessive thirst?: No  Musculoskeletal Back pain?: No Joint pain?: No  Neurological Headaches?:  No Dizziness?: No  Psychologic Depression?: No Anxiety?: No   Physical Exam: BP (!) 151/92   Pulse 99   Ht 5\' 10"  (1.778 m)   Wt 184 lb 6.4 oz (83.6 kg)   BMI 26.46 kg/m   Constitutional: Well nourished. Alert and oriented, No acute distress. HEENT: [place] AT, moist mucus membranes. Trachea midline, no masses. Cardiovascular: No clubbing, cyanosis, or edema. Respiratory: Normal respiratory effort, no increased work of breathing. Skin: No rashes, bruises or suspicious lesions. Lymph: No  cervical or inguinal adenopathy. Neurologic: Grossly intact, no focal deficits, moving all 4 extremities. Psychiatric: Normal mood and affect.  Laboratory Data: Lab Results  Component Value Date   WBC 6.0 07/31/2016   HGB 13.3 07/31/2016   HCT 40.0 07/31/2016   MCV 90.6 07/31/2016   PLT 209 07/31/2016    Lab Results  Component Value Date   CREATININE 1.28 (H) 07/31/2016     Lab Results  Component Value Date   TSH 4.33 01/31/2013       Component Value Date/Time   CHOL 126 01/31/2013 0505   HDL 33 (L) 01/31/2013 0505   VLDL 34 01/31/2013 0505   LDLCALC 59 01/31/2013 0505    Lab Results  Component Value Date   AST 28 07/31/2016   Lab Results  Component Value Date   ALT 20 07/31/2016     PTNS treatment: The needle electrode was inserted into the lower, inner aspect of the patient's right leg. The surface electrode was placed on the inside arch of the foot on the treatment leg. The lead set was connected to the stimulator and the needle electrode clip was connected to the needle electrode. The stimulator that produces an adjustable electrical pulse that travels to the sacral nerve plexus via the tibial nerve was increased to 16 until the patient received a toe flex and a sensory response.       Assessment & Plan:   1. Urge incontinence  - Treatment Plan:  The needle electrode was removed without difficulty to the patient.  Patient tolerated the procedure for 30 minutes.   He will return next week for # 4 out of 12 of their weekly PTNS treatment's   Return in about 1 week (around 08/31/2016) for # 4 PTNS.  These notes generated with voice recognition software. I apologize for typographical errors.  Michiel CowboySHANNON Samuel Rittenhouse, PA-C  [facility] [address], [address] [place], [place] [address] [phone]

## 2016-08-24 ENCOUNTER — Ambulatory Visit (INDEPENDENT_AMBULATORY_CARE_PROVIDER_SITE_OTHER): Payer: Medicare Other | Admitting: Urology

## 2016-08-24 ENCOUNTER — Encounter: Payer: Self-pay | Admitting: Urology

## 2016-08-24 VITALS — BP 151/92 | HR 99 | Ht 70.0 in | Wt 184.4 lb

## 2016-08-24 DIAGNOSIS — N3941 Urge incontinence: Secondary | ICD-10-CM

## 2016-08-24 LAB — PTNS-PERCUTANEOUS TIBIAL NERVE STIMULATION: Scan Result: 16

## 2016-08-24 NOTE — Progress Notes (Signed)
     Session # 3  Health & Social Factors: No Change Caffeine:2 Alcohol: 3  7 oz Daytime voids #per day: 12 Night-time voids #per night: 0 Urgency: Strong Incontinence Episodes #per day: 0-1 Ankle used: Right Treatment Setting: 16 Feeling/ Response: Both  Preformed By: Michiel CowboyShannon McGowan PA-C  Assistant: Dallas Schimkeamona Williams CMA  Follow Up: One Week

## 2016-08-30 NOTE — Progress Notes (Signed)
Chief Complaint:  Chief Complaint  Patient presents with  . PTNS    urge incontinence     HPI: 83 yo WM who presents today to begin a 12 weekly treatments of PTNS.  Today is # 4/12.  The patient with urgency incontinence and frequency assessed by Dr. Thea Silversmith.  He was on mirabegron last visit. He was given Flomax on his last visit. Apparently he has failed Vesicare. He had a 60 g benign prostate on rectal examination He has mild urge incontinence but is not a daily problem. He denies stress incontinence. He intermittently has mild enuresis. He does not wear pads. He voids every 1 to 2R's gets up once at night  During his last visit I explained to him that he had an enlarged prostate but I was treating him primarily for bladder spasms. I did not recommend pelvic surgery. He is here on a trial of Toviaz 8 mg. I do not think a urodynamics which changes management. He has never had cystoscopy and last visit had inserted 2 red blood cells per high power field  The patient had been 60% better with less foot on the floor syndrome and less frequency and urge incontinence on Toviaz. Percutaneous tibial nerve stimulation was discussed last time in April   The last visit the Gala Murdoch was no longer working and he wanted to try one more medication. I gave him oxybutynin ER 10 mg.   Today Frequency stable The patient still has urgency incontinence. He failed oxybutynin as well area did          Previous Therapy:            Pelvic Floor Exercises - not attempted           Biofeedback - not attempted           Bladder Training - not attempted           Fluid Management - not attempted           Dietary Restrictions - not attempted    Contraindications present for PTNS      Pacemaker - YES - patient has cardiac clearance      Implantable defibrillator - NO       History of abnormal bleeding - NO      History of neuropathies or nerve damage - NO  Discussed with patient possible  complications of procedure, such as discomfort, bleeding at insertion/stimulation site, procedure consent signed  Patient goals:     To stop having to go to the bathroom every time he puts a key in the door.    Today, the patient is complaining of frequency and urgency.  He denies dysuria, gross hematuria and suprapubic.  He denies fevers, chills, nausea and vomiting.    Starting symptoms, 8 day time voids, 0 night time voids and zero incontinence episodes daily.  After his third treatment, he is experiencing 12 daytime voids (worsening), 0 night time voids (stable) and 0-1 incontinence episodes. (stable).     PMH: Past Medical History:  Diagnosis Date  . Anxiety   . Depression   . Gout   . Urge incontinence     Surgical History: Past Surgical History:  Procedure Laterality Date  . HERNIA REPAIR    . PACEMAKER INSERTION      Home Medications:  Allergies as of 08/31/2016      Reactions   Colchicine    Doxycycline Other (See Comments)   Meloxicam Other (See Comments)  Sulfa Antibiotics    Other reaction(s): UNKNOWN   Tussin  [guaifenesin] Other (See Comments)   Amoxicillin-pot Clavulanate Rash      Medication List       Accurate as of 08/31/16  1:44 PM. Always use your most recent med list.          acetaminophen 325 MG tablet Commonly known as:  TYLENOL Take by mouth.   AMBIEN 5 MG tablet Generic drug:  zolpidem Take 5 mg by mouth at bedtime.   Colchicine 0.6 MG Caps TAKE ONE CAPSULE DAILY   DULoxetine 30 MG capsule Commonly known as:  CYMBALTA Take by mouth.   fesoterodine 8 MG Tb24 tablet Commonly known as:  TOVIAZ Take 1 tablet (8 mg total) by mouth daily.   hydrochlorothiazide 12.5 MG tablet Commonly known as:  HYDRODIURIL Take 1 tablet (12.5 mg total) by mouth daily.   hydrOXYzine 25 MG tablet Commonly known as:  ATARAX/VISTARIL Take 25 mg by mouth at bedtime.   losartan 50 MG tablet Commonly known as:  COZAAR Take by mouth.     losartan-hydrochlorothiazide 100-12.5 MG tablet Commonly known as:  HYZAAR Take by mouth.   methylPREDNISolone 4 MG Tbpk tablet Commonly known as:  MEDROL DOSEPAK 6 day dose pack - take as directed   predniSONE 5 MG tablet Commonly known as:  DELTASONE Take 5 mg by mouth as needed.   traMADol 50 MG tablet Commonly known as:  ULTRAM Take 50 mg by mouth every 6 (six) hours as needed.       Allergies:  Allergies  Allergen Reactions  . Colchicine   . Doxycycline Other (See Comments)  . Meloxicam Other (See Comments)  . Sulfa Antibiotics     Other reaction(s): UNKNOWN  . Tussin  [Guaifenesin] Other (See Comments)  . Amoxicillin-Pot Clavulanate Rash    Family History: Family History  Problem Relation Age of Onset  . Hematuria Neg Hx   . Kidney cancer Neg Hx   . Prostate cancer Neg Hx   . Bladder Cancer Neg Hx     Social History:  reports that he has been smoking Cigars.  He has never used smokeless tobacco. He reports that he drinks alcohol. He reports that he does not use drugs.  ROS: UROLOGY Frequent Urination?: Yes Hard to postpone urination?: Yes Burning/pain with urination?: No Get up at night to urinate?: No Leakage of urine?: No Urine stream starts and stops?: No Trouble starting stream?: No Do you have to strain to urinate?: No Blood in urine?: No Urinary tract infection?: No Sexually transmitted disease?: No Injury to kidneys or bladder?: No Painful intercourse?: No Weak stream?: No Erection problems?: No Penile pain?: No  Gastrointestinal Nausea?: No Vomiting?: No Indigestion/heartburn?: No Diarrhea?: No Constipation?: No  Constitutional Fever: No Night sweats?: No Weight loss?: No Fatigue?: No  Skin Skin rash/lesions?: No Itching?: No  Eyes Blurred vision?: No Double vision?: No  Ears/Nose/Throat Sore throat?: No Sinus problems?: No  Hematologic/Lymphatic Swollen glands?: No Easy bruising?: No  Cardiovascular Leg  swelling?: No Chest pain?: No  Respiratory Cough?: No Shortness of breath?: No  Endocrine Excessive thirst?: No  Musculoskeletal Back pain?: No Joint pain?: No  Neurological Headaches?: No Dizziness?: No  Psychologic Depression?: No Anxiety?: No   Physical Exam: BP 110/73   Pulse (!) 103   Ht 5\' 10"  (1.778 m)   Wt 179 lb 1.6 oz (81.2 kg)   BMI 25.70 kg/m   Constitutional: Well nourished. Alert and oriented, No acute distress.  HEENT: [place] AT, moist mucus membranes. Trachea midline, no masses. Cardiovascular: No clubbing, cyanosis, or edema. Respiratory: Normal respiratory effort, no increased work of breathing. Skin: No rashes, bruises or suspicious lesions. Lymph: No cervical or inguinal adenopathy. Neurologic: Grossly intact, no focal deficits, moving all 4 extremities. Psychiatric: Normal mood and affect.  Laboratory Data: Lab Results  Component Value Date   WBC 6.0 07/31/2016   HGB 13.3 07/31/2016   HCT 40.0 07/31/2016   MCV 90.6 07/31/2016   PLT 209 07/31/2016    Lab Results  Component Value Date   CREATININE 1.28 (H) 07/31/2016     Lab Results  Component Value Date   TSH 4.33 01/31/2013       Component Value Date/Time   CHOL 126 01/31/2013 0505   HDL 33 (L) 01/31/2013 0505   VLDL 34 01/31/2013 0505   LDLCALC 59 01/31/2013 0505    Lab Results  Component Value Date   AST 28 07/31/2016   Lab Results  Component Value Date   ALT 20 07/31/2016     PTNS treatment: The needle electrode was inserted into the lower, inner aspect of the patient's right leg. The surface electrode was placed on the inside arch of the foot on the treatment leg. The lead set was connected to the stimulator and the needle electrode clip was connected to the needle electrode. The stimulator that produces an adjustable electrical pulse that travels to the sacral nerve plexus via the tibial nerve was increased to 18 until the patient received a toe flex and a sensory  response.      Assessment & Plan:   1. Urge incontinence  - Treatment Plan:  The needle electrode was removed without difficulty to the patient.  Patient tolerated the procedure for 30 minutes.  He will return next week for # 5 out of 12 of their weekly PTNS treatment's   Return for # 5 PTNS.  These notes generated with voice recognition software. I apologize for typographical errors.  Michiel CowboySHANNON Azriella Mattia, PA-C  [facility] [address], [address] [place], [place] [id number] [phone]

## 2016-08-31 ENCOUNTER — Encounter: Payer: Self-pay | Admitting: Urology

## 2016-08-31 ENCOUNTER — Ambulatory Visit (INDEPENDENT_AMBULATORY_CARE_PROVIDER_SITE_OTHER): Payer: Medicare Other | Admitting: Urology

## 2016-08-31 VITALS — BP 110/73 | HR 103 | Ht 70.0 in | Wt 179.1 lb

## 2016-08-31 DIAGNOSIS — N3941 Urge incontinence: Secondary | ICD-10-CM | POA: Diagnosis not present

## 2016-08-31 LAB — PTNS-PERCUTANEOUS TIBIAL NERVE STIMULATION: Scan Result: 18

## 2016-08-31 NOTE — Progress Notes (Signed)
PTNS  Session # 4  Health & Social Factors: No Change Caffeine: 2 Alcohol: 3   7 oz Daytime voids #per day: 12 Night-time voids #per night: 0 Urgency: Strong Incontinence Episodes #per day: 0-1 Ankle used: Right Treatment Setting: 18 Feeling/ Response: Both  Preformed By: Michiel CowboyShannon McGowan PA-C  Assistant: Dallas Schimkeamona Williams CMA  Follow Up: One week

## 2016-09-06 NOTE — Progress Notes (Signed)
Chief Complaint:  Chief Complaint  Patient presents with  . PTNS    urge incontinence     HPI: 83 yo WM who presents today to begin a 12 weekly treatments of PTNS.  Today is # 5/12.  The patient with urgency incontinence and frequency assessed by Dr. Thea Silversmith.  He was on mirabegron last visit. He was given Flomax on his last visit. Apparently he has failed Vesicare. He had a 60 g benign prostate on rectal examination He has mild urge incontinence but is not a daily problem. He denies stress incontinence. He intermittently has mild enuresis. He does not wear pads. He voids every 1 to 2R's gets up once at night  During his last visit I explained to him that he had an enlarged prostate but I was treating him primarily for bladder spasms. I did not recommend pelvic surgery. He is here on a trial of Toviaz 8 mg. I do not think a urodynamics which changes management. He has never had cystoscopy and last visit had inserted 2 red blood cells per high power field  The patient had been 60% better with less foot on the floor syndrome and less frequency and urge incontinence on Toviaz. Percutaneous tibial nerve stimulation was discussed last time in April   The last visit the Gala Murdoch was no longer working and he wanted to try one more medication. I gave him oxybutynin ER 10 mg.   Today Frequency stable The patient still has urgency incontinence. He failed oxybutynin as well area did          Previous Therapy:            Pelvic Floor Exercises - not attempted           Biofeedback - not attempted           Bladder Training - not attempted           Fluid Management - not attempted           Dietary Restrictions - not attempted    Contraindications present for PTNS      Pacemaker - YES - patient has cardiac clearance      Implantable defibrillator - NO       History of abnormal bleeding - NO      History of neuropathies or nerve damage - NO  Discussed with patient possible  complications of procedure, such as discomfort, bleeding at insertion/stimulation site, procedure consent signed  Patient goals:     To stop having to go to the bathroom every time he puts a key in the door.    Today, the patient is complaining of frequency and urgency.  He denies dysuria, gross hematuria and suprapubic.  He denies fevers, chills, nausea and vomiting.    Starting symptoms, 8 day time voids, 0 night time voids and zero incontinence episodes daily.  After his fourth treatment, he is experiencing  13 daytime voids (worse), 0 night time voids (stable) and 1 incontinence episodes. (worse).     PMH: Past Medical History:  Diagnosis Date  . Anxiety   . Depression   . Gout   . Urge incontinence     Surgical History: Past Surgical History:  Procedure Laterality Date  . HERNIA REPAIR    . PACEMAKER INSERTION      Home Medications:  Allergies as of 09/07/2016      Reactions   Colchicine    Doxycycline Other (See Comments)   Meloxicam Other (See  Comments)   Sulfa Antibiotics    Other reaction(s): UNKNOWN   Tussin  [guaifenesin] Other (See Comments)   Amoxicillin-pot Clavulanate Rash      Medication List       Accurate as of 09/07/16  2:10 PM. Always use your most recent med list.          acetaminophen 325 MG tablet Commonly known as:  TYLENOL Take by mouth.   AMBIEN 5 MG tablet Generic drug:  zolpidem Take 5 mg by mouth at bedtime.   amLODipine 5 MG tablet Commonly known as:  NORVASC Take by mouth.   Colchicine 0.6 MG Caps TAKE ONE CAPSULE DAILY   DULoxetine 30 MG capsule Commonly known as:  CYMBALTA Take by mouth.   fesoterodine 8 MG Tb24 tablet Commonly known as:  TOVIAZ Take 1 tablet (8 mg total) by mouth daily.   hydrochlorothiazide 12.5 MG tablet Commonly known as:  HYDRODIURIL Take 1 tablet (12.5 mg total) by mouth daily.   hydrOXYzine 25 MG tablet Commonly known as:  ATARAX/VISTARIL Take 25 mg by mouth at bedtime.   losartan 50 MG  tablet Commonly known as:  COZAAR Take by mouth.   losartan-hydrochlorothiazide 100-12.5 MG tablet Commonly known as:  HYZAAR Take by mouth.   methylPREDNISolone 4 MG Tbpk tablet Commonly known as:  MEDROL DOSEPAK 6 day dose pack - take as directed   predniSONE 5 MG tablet Commonly known as:  DELTASONE Take 5 mg by mouth as needed.   traMADol 50 MG tablet Commonly known as:  ULTRAM Take 50 mg by mouth every 6 (six) hours as needed.       Allergies:  Allergies  Allergen Reactions  . Colchicine   . Doxycycline Other (See Comments)  . Meloxicam Other (See Comments)  . Sulfa Antibiotics     Other reaction(s): UNKNOWN  . Tussin  [Guaifenesin] Other (See Comments)  . Amoxicillin-Pot Clavulanate Rash    Family History: Family History  Problem Relation Age of Onset  . Hematuria Neg Hx   . Kidney cancer Neg Hx   . Prostate cancer Neg Hx   . Bladder Cancer Neg Hx     Social History:  reports that he has been smoking Cigars.  He has never used smokeless tobacco. He reports that he drinks alcohol. He reports that he does not use drugs.  ROS: UROLOGY Frequent Urination?: Yes Hard to postpone urination?: Yes Burning/pain with urination?: No Get up at night to urinate?: No Leakage of urine?: No Urine stream starts and stops?: No Trouble starting stream?: No Do you have to strain to urinate?: No Blood in urine?: No Urinary tract infection?: No Sexually transmitted disease?: No Injury to kidneys or bladder?: No Painful intercourse?: No Weak stream?: No Erection problems?: No Penile pain?: No  Gastrointestinal Nausea?: No Vomiting?: No Indigestion/heartburn?: No Diarrhea?: No Constipation?: No  Constitutional Fever: No Night sweats?: No Weight loss?: No Fatigue?: No  Skin Skin rash/lesions?: No Itching?: No  Eyes Blurred vision?: No Double vision?: No  Ears/Nose/Throat Sore throat?: No Sinus problems?: No  Hematologic/Lymphatic Swollen glands?:  No Easy bruising?: No  Cardiovascular Leg swelling?: No Chest pain?: No  Respiratory Cough?: No Shortness of breath?: No  Endocrine Excessive thirst?: No  Musculoskeletal Back pain?: No Joint pain?: No  Neurological Headaches?: No Dizziness?: No  Psychologic Depression?: No Anxiety?: No   Physical Exam: BP 114/79   Pulse 99   Ht 5' 9.5" (1.765 m)   Wt 180 lb 3.2 oz (81.7 kg)  BMI 26.23 kg/m   Constitutional: Well nourished. Alert and oriented, No acute distress. HEENT: [place] AT, moist mucus membranes. Trachea midline, no masses. Cardiovascular: No clubbing, cyanosis, or edema. Respiratory: Normal respiratory effort, no increased work of breathing. Skin: No rashes, bruises or suspicious lesions. Lymph: No cervical or inguinal adenopathy. Neurologic: Grossly intact, no focal deficits, moving all 4 extremities. Psychiatric: Normal mood and affect.  Laboratory Data: Lab Results  Component Value Date   WBC 6.0 07/31/2016   HGB 13.3 07/31/2016   HCT 40.0 07/31/2016   MCV 90.6 07/31/2016   PLT 209 07/31/2016    Lab Results  Component Value Date   CREATININE 1.28 (H) 07/31/2016     Lab Results  Component Value Date   TSH 4.33 01/31/2013       Component Value Date/Time   CHOL 126 01/31/2013 0505   HDL 33 (L) 01/31/2013 0505   VLDL 34 01/31/2013 0505   LDLCALC 59 01/31/2013 0505    Lab Results  Component Value Date   AST 28 07/31/2016   Lab Results  Component Value Date   ALT 20 07/31/2016     PTNS treatment: The needle electrode was inserted into the lower, inner aspect of the patient's left leg. The surface electrode was placed on the inside arch of the foot on the treatment leg. The lead set was connected to the stimulator and the needle electrode clip was connected to the needle electrode. The stimulator that produces an adjustable electrical pulse that travels to the sacral nerve plexus via the tibial nerve was increased to 2 until the  patient received a sensory response.     Assessment & Plan:   1. Urge incontinence  - Treatment Plan:  The needle electrode was removed without difficulty to the patient.  Patient tolerated the procedure for 30 minutes.  He will return next week for # 6 out of 12 of their weekly PTNS treatment's   Return in about 1 week (around 09/14/2016) for # 6 PTNS.  These notes generated with voice recognition software. I apologize for typographical errors.  Michiel Cowboy, PA-C  [facility] [address], [address] [place], [place] [address] [phone]

## 2016-09-07 ENCOUNTER — Encounter: Payer: Self-pay | Admitting: Urology

## 2016-09-07 ENCOUNTER — Ambulatory Visit (INDEPENDENT_AMBULATORY_CARE_PROVIDER_SITE_OTHER): Payer: Medicare Other | Admitting: Urology

## 2016-09-07 VITALS — BP 114/79 | HR 99 | Ht 69.5 in | Wt 180.2 lb

## 2016-09-07 DIAGNOSIS — N3941 Urge incontinence: Secondary | ICD-10-CM | POA: Diagnosis not present

## 2016-09-07 NOTE — Progress Notes (Signed)
PTNS  Session # 5  Health & Social Factors: No Change Caffeine: 2 Alcohol: 3  7 oz Daytime voids #per day: 13 Night-time voids #per night: 0 Urgency: Mild Incontinence Episodes #per day: 1 Ankle used: Left Treatment Setting: 2 Feeling/ Response: Sensory  Preformed By: Michiel Cowboy PA-C  Assistant: Dallas Schimke CMA  Follow Up: One week

## 2016-09-13 NOTE — Progress Notes (Signed)
Chief Complaint:  Chief Complaint  Patient presents with  . PTNS    Urge incontinence     HPI: 83 yo WM who presents today to begin a 12 weekly treatments of PTNS.  Today is # 6/12.  The patient with urgency incontinence and frequency assessed by Dr. Thea Silversmith.  He was on mirabegron last visit. He was given Flomax on his last visit. Apparently he has failed Vesicare. He had a 60 g benign prostate on rectal examination He has mild urge incontinence but is not a daily problem. He denies stress incontinence. He intermittently has mild enuresis. He does not wear pads. He voids every 1 to 2R's gets up once at night  During his last visit I explained to him that he had an enlarged prostate but I was treating him primarily for bladder spasms. I did not recommend pelvic surgery. He is here on a trial of Toviaz 8 mg. I do not think a urodynamics which changes management. He has never had cystoscopy and last visit had inserted 2 red blood cells per high power field  The patient had been 60% better with less foot on the floor syndrome and less frequency and urge incontinence on Toviaz. Percutaneous tibial nerve stimulation was discussed last time in April   The last visit the Gala Murdoch was no longer working and he wanted to try one more medication. I gave him oxybutynin ER 10 mg.   Today Frequency stable The patient still has urgency incontinence. He failed oxybutynin as well area did          Previous Therapy:            Pelvic Floor Exercises - not attempted           Biofeedback - not attempted           Bladder Training - not attempted           Fluid Management - not attempted           Dietary Restrictions - not attempted    Contraindications present for PTNS      Pacemaker - YES - patient has cardiac clearance      Implantable defibrillator - NO       History of abnormal bleeding - NO      History of neuropathies or nerve damage - NO  Discussed with patient possible  complications of procedure, such as discomfort, bleeding at insertion/stimulation site, procedure consent signed  Patient goals:     To stop having to go to the bathroom every time he puts a key in the door.    Today, the patient is complaining of frequency and urgency.  He denies dysuria, gross hematuria and suprapubic.  He denies fevers, chills, nausea and vomiting.    Starting symptoms, 8 day time voids, 0 night time voids and zero incontinence episodes daily.  After his fifth treatment, he is experiencing  13 daytime voids (worse), 0 night time voids (stable) and 1 incontinence episodes. (worse).     PMH: Past Medical History:  Diagnosis Date  . Anxiety   . Depression   . Gout   . Urge incontinence     Surgical History: Past Surgical History:  Procedure Laterality Date  . HERNIA REPAIR    . PACEMAKER INSERTION      Home Medications:  Allergies as of 09/14/2016      Reactions   Colchicine    Doxycycline Other (See Comments)   Meloxicam Other (See  Comments)   Sulfa Antibiotics    Other reaction(s): UNKNOWN   Tussin  [guaifenesin] Other (See Comments)   Amoxicillin-pot Clavulanate Rash      Medication List       Accurate as of 09/14/16  2:14 PM. Always use your most recent med list.          acetaminophen 325 MG tablet Commonly known as:  TYLENOL Take by mouth.   AMBIEN 5 MG tablet Generic drug:  zolpidem Take 5 mg by mouth at bedtime.   amLODipine 5 MG tablet Commonly known as:  NORVASC Take by mouth.   Colchicine 0.6 MG Caps TAKE ONE CAPSULE DAILY   DULoxetine 30 MG capsule Commonly known as:  CYMBALTA Take by mouth.   fesoterodine 8 MG Tb24 tablet Commonly known as:  TOVIAZ Take 1 tablet (8 mg total) by mouth daily.   hydrochlorothiazide 12.5 MG tablet Commonly known as:  HYDRODIURIL Take 1 tablet (12.5 mg total) by mouth daily.   hydrOXYzine 25 MG tablet Commonly known as:  ATARAX/VISTARIL Take 25 mg by mouth at bedtime.   losartan 50 MG  tablet Commonly known as:  COZAAR Take by mouth.   losartan-hydrochlorothiazide 100-12.5 MG tablet Commonly known as:  HYZAAR Take by mouth.   methylPREDNISolone 4 MG Tbpk tablet Commonly known as:  MEDROL DOSEPAK 6 day dose pack - take as directed   predniSONE 5 MG tablet Commonly known as:  DELTASONE Take 5 mg by mouth as needed.   traMADol 50 MG tablet Commonly known as:  ULTRAM Take 50 mg by mouth every 6 (six) hours as needed.       Allergies:  Allergies  Allergen Reactions  . Colchicine   . Doxycycline Other (See Comments)  . Meloxicam Other (See Comments)  . Sulfa Antibiotics     Other reaction(s): UNKNOWN  . Tussin  [Guaifenesin] Other (See Comments)  . Amoxicillin-Pot Clavulanate Rash    Family History: Family History  Problem Relation Age of Onset  . Hematuria Neg Hx   . Kidney cancer Neg Hx   . Prostate cancer Neg Hx   . Bladder Cancer Neg Hx     Social History:  reports that he has been smoking Cigars.  He has never used smokeless tobacco. He reports that he drinks alcohol. He reports that he does not use drugs.  ROS: UROLOGY Frequent Urination?: Yes Hard to postpone urination?: Yes Burning/pain with urination?: No Get up at night to urinate?: No Leakage of urine?: No Urine stream starts and stops?: No Trouble starting stream?: No Do you have to strain to urinate?: No Blood in urine?: No Urinary tract infection?: No Sexually transmitted disease?: No Injury to kidneys or bladder?: No Painful intercourse?: No Weak stream?: No Erection problems?: No Penile pain?: No  Gastrointestinal Nausea?: No Vomiting?: No Indigestion/heartburn?: No Diarrhea?: No Constipation?: No  Constitutional Fever: No Night sweats?: No Weight loss?: No Fatigue?: No  Skin Skin rash/lesions?: No Itching?: No  Eyes Blurred vision?: No Double vision?: No  Ears/Nose/Throat Sore throat?: No Sinus problems?: No  Hematologic/Lymphatic Swollen glands?:  No Easy bruising?: No  Cardiovascular Leg swelling?: No Chest pain?: No  Respiratory Cough?: No Shortness of breath?: No  Endocrine Excessive thirst?: No  Musculoskeletal Back pain?: No Joint pain?: No  Neurological Headaches?: No Dizziness?: No  Psychologic Depression?: No Anxiety?: No   Physical Exam: BP (!) 143/94   Pulse (!) 102   Ht 5' 9.5" (1.765 m)   Wt 180 lb 9.6 oz (81.9  kg)   BMI 26.29 kg/m   Constitutional: Well nourished. Alert and oriented, No acute distress. HEENT: [place] AT, moist mucus membranes. Trachea midline, no masses. Cardiovascular: No clubbing, cyanosis, or edema. Respiratory: Normal respiratory effort, no increased work of breathing. Skin: No rashes, bruises or suspicious lesions. Lymph: No cervical or inguinal adenopathy. Neurologic: Grossly intact, no focal deficits, moving all 4 extremities. Psychiatric: Normal mood and affect.  Laboratory Data: Lab Results  Component Value Date   WBC 6.0 07/31/2016   HGB 13.3 07/31/2016   HCT 40.0 07/31/2016   MCV 90.6 07/31/2016   PLT 209 07/31/2016    Lab Results  Component Value Date   CREATININE 1.28 (H) 07/31/2016     Lab Results  Component Value Date   TSH 4.33 01/31/2013       Component Value Date/Time   CHOL 126 01/31/2013 0505   HDL 33 (L) 01/31/2013 0505   VLDL 34 01/31/2013 0505   LDLCALC 59 01/31/2013 0505    Lab Results  Component Value Date   AST 28 07/31/2016   Lab Results  Component Value Date   ALT 20 07/31/2016     PTNS treatment: The needle electrode was inserted into the lower, inner aspect of the patient's left leg. The surface electrode was placed on the inside arch of the foot on the treatment leg. The lead set was connected to the stimulator and the needle electrode clip was connected to the needle electrode. The stimulator that produces an adjustable electrical pulse that travels to the sacral nerve plexus via the tibial nerve was increased to 9  until the patient received a sensory and toe flex response.     Assessment & Plan:   1. Urge incontinence  - Treatment Plan:  The needle electrode was removed without difficulty to the patient.  Patient tolerated the procedure for 30 minutes.  He will return next week for # 7 out of 12 of their weekly PTNS treatment's   Return in about 1 week (around 09/21/2016) for # 7 PTNS.  These notes generated with voice recognition software. I apologize for typographical errors.  Michiel Cowboy, PA-C  [facility] [address] [place], [place] [address] [phone]

## 2016-09-14 ENCOUNTER — Ambulatory Visit (INDEPENDENT_AMBULATORY_CARE_PROVIDER_SITE_OTHER): Payer: Medicare Other | Admitting: Urology

## 2016-09-14 ENCOUNTER — Encounter: Payer: Self-pay | Admitting: Urology

## 2016-09-14 VITALS — BP 143/94 | HR 102 | Ht 69.5 in | Wt 180.6 lb

## 2016-09-14 DIAGNOSIS — N3941 Urge incontinence: Secondary | ICD-10-CM

## 2016-09-14 LAB — PTNS-PERCUTANEOUS TIBIAL NERVE STIMULATION: Scan Result: 9

## 2016-09-14 NOTE — Progress Notes (Signed)
PTNS  Session # 6  Health & Social Factors: No Change Caffeine: 2 Alcohol: 3  7 oz Daytime voids #per day: 13 Night-time voids #per night: 0 Urgency: Mild Incontinence Episodes #per day: 1 Ankle used: Left Treatment Setting: 9 Feeling/ Response: Sensory Comments: Reminded patient to bring bladder diary, patient just states same as last no change  Preformed By: Michiel Cowboy PA-C   Assistant: Dallas Schimke CMA  Follow Up: One week

## 2016-09-19 ENCOUNTER — Ambulatory Visit (INDEPENDENT_AMBULATORY_CARE_PROVIDER_SITE_OTHER): Payer: Medicare Other | Admitting: Podiatry

## 2016-09-19 ENCOUNTER — Encounter: Payer: Self-pay | Admitting: Podiatry

## 2016-09-19 DIAGNOSIS — M109 Gout, unspecified: Secondary | ICD-10-CM

## 2016-09-19 MED ORDER — PREDNISONE 10 MG (21) PO TBPK: ORAL_TABLET | ORAL | 0 refills | Status: DC

## 2016-09-19 NOTE — Progress Notes (Signed)
He presents today to complain of having a gout flareup for the past 4-5 days. He states his toes colchicine but really has not noticed any changes in the gout yet. Denies fever chills nausea and vomiting. Denies trauma.  Objective: Vital signs are stable he is alert and oriented 3. Pulses are palpable. Erythema and edema along the first metatarsophalangeal joint area is warm to the touch and severely tender. He has pain on range of motion.  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  Plan: Injected around the joint today with Kenalog and local anesthetic. Start him on a stair. Dosepak. Instructed him to go ahead and continue his colchicine. Follow up with him in 2 weeks.

## 2016-09-20 NOTE — Progress Notes (Signed)
Chief Complaint:  Chief Complaint  Patient presents with  . PTNS    urge incotninence     HPI: 83 yo WM who presents today to begin a 12 weekly treatments of PTNS.  Today is /12.  The patient with urgency incontinence and frequency assessed by Dr. Thea Silversmith.  He was on mirabegron last visit. He was given Flomax on his last visit. Apparently he has failed Vesicare. He had a 60 g benign prostate on rectal examination He has mild urge incontinence but is not a daily problem. He denies stress incontinence. He intermittently has mild enuresis. He does not wear pads. He voids every 1 to 2R's gets up once at night  During his last visit I explained to him that he had an enlarged prostate but I was treating him primarily for bladder spasms. I did not recommend pelvic surgery. He is here on a trial of Toviaz 8 mg. I do not think a urodynamics which changes management. He has never had cystoscopy and last visit had inserted 2 red blood cells per high power field  The patient had been 60% better with less foot on the floor syndrome and less frequency and urge incontinence on Toviaz. Percutaneous tibial nerve stimulation was discussed last time in April   The last visit the Gala Murdoch was no longer working and he wanted to try one more medication. I gave him oxybutynin ER 10 mg.   Today Frequency stable The patient still has urgency incontinence. He failed oxybutynin as well area did          Previous Therapy:            Pelvic Floor Exercises - not attempted           Biofeedback - not attempted           Bladder Training - not attempted           Fluid Management - not attempted           Dietary Restrictions - not attempted    Contraindications present for PTNS      Pacemaker - YES - patient has cardiac clearance      Implantable defibrillator - NO       History of abnormal bleeding - NO      History of neuropathies or nerve damage - NO  Discussed with patient possible  complications of procedure, such as discomfort, bleeding at insertion/stimulation site, procedure consent signed  Patient goals:     To stop having to go to the bathroom every time he puts a key in the door.    Today, the patient is complaining of frequency and urgency.  He denies dysuria, gross hematuria and suprapubic.  He denies fevers, chills, nausea and vomiting.    Starting symptoms, 8 day time voids, 0 night time voids and zero incontinence episodes daily.  After his sixth treatment, he is experiencing  11 daytime voids (worse), 0 night time voids (stable) and 0 incontinence episodes. (stable).     PMH: Past Medical History:  Diagnosis Date  . Anxiety   . Depression   . Gout   . Urge incontinence     Surgical History: Past Surgical History:  Procedure Laterality Date  . HERNIA REPAIR    . PACEMAKER INSERTION      Home Medications:  Allergies as of 09/21/2016      Reactions   Colchicine    Doxycycline Other (See Comments)   Meloxicam Other (See Comments)  Sulfa Antibiotics    Other reaction(s): UNKNOWN   Tussin  [guaifenesin] Other (See Comments)   Amoxicillin-pot Clavulanate Rash      Medication List       Accurate as of 09/21/16  2:10 PM. Always use your most recent med list.          acetaminophen 325 MG tablet Commonly known as:  TYLENOL Take by mouth.   AMBIEN 5 MG tablet Generic drug:  zolpidem Take 5 mg by mouth at bedtime.   amLODipine 5 MG tablet Commonly known as:  NORVASC Take by mouth.   Colchicine 0.6 MG Caps TAKE ONE CAPSULE DAILY   DULoxetine 30 MG capsule Commonly known as:  CYMBALTA Take by mouth.   fesoterodine 8 MG Tb24 tablet Commonly known as:  TOVIAZ Take 1 tablet (8 mg total) by mouth daily.   hydrochlorothiazide 12.5 MG tablet Commonly known as:  HYDRODIURIL Take 1 tablet (12.5 mg total) by mouth daily.   hydrOXYzine 25 MG tablet Commonly known as:  ATARAX/VISTARIL Take 25 mg by mouth at bedtime.   losartan 50  MG tablet Commonly known as:  COZAAR Take by mouth.   losartan-hydrochlorothiazide 100-12.5 MG tablet Commonly known as:  HYZAAR Take by mouth.   predniSONE 5 MG tablet Commonly known as:  DELTASONE Take 5 mg by mouth as needed.   predniSONE 10 MG (21) Tbpk tablet Commonly known as:  STERAPRED UNI-PAK 21 TAB Take as directed on blister pack   traMADol 50 MG tablet Commonly known as:  ULTRAM Take 50 mg by mouth every 6 (six) hours as needed.       Allergies:  Allergies  Allergen Reactions  . Colchicine   . Doxycycline Other (See Comments)  . Meloxicam Other (See Comments)  . Sulfa Antibiotics     Other reaction(s): UNKNOWN  . Tussin  [Guaifenesin] Other (See Comments)  . Amoxicillin-Pot Clavulanate Rash    Family History: Family History  Problem Relation Age of Onset  . Hematuria Neg Hx   . Kidney cancer Neg Hx   . Prostate cancer Neg Hx   . Bladder Cancer Neg Hx     Social History:  reports that he has been smoking Cigars.  He has never used smokeless tobacco. He reports that he drinks alcohol. He reports that he does not use drugs.  ROS: UROLOGY Frequent Urination?: Yes Hard to postpone urination?: Yes Burning/pain with urination?: No Get up at night to urinate?: No Leakage of urine?: No Urine stream starts and stops?: No Trouble starting stream?: No Do you have to strain to urinate?: No Blood in urine?: No Urinary tract infection?: No Sexually transmitted disease?: No Injury to kidneys or bladder?: No Painful intercourse?: No Weak stream?: No Erection problems?: No Penile pain?: No  Gastrointestinal Nausea?: No Vomiting?: No Indigestion/heartburn?: No Diarrhea?: No Constipation?: No  Constitutional Fever: No Night sweats?: No Weight loss?: No Fatigue?: No  Skin Skin rash/lesions?: No Itching?: No  Eyes Blurred vision?: No Double vision?: No  Ears/Nose/Throat Sore throat?: No Sinus problems?: No  Hematologic/Lymphatic Swollen  glands?: No Easy bruising?: No  Cardiovascular Leg swelling?: No Chest pain?: No  Respiratory Cough?: No Shortness of breath?: No  Endocrine Excessive thirst?: No  Musculoskeletal Back pain?: No Joint pain?: No  Neurological Headaches?: No Dizziness?: No  Psychologic Depression?: No Anxiety?: No   Physical Exam: BP (!) 165/91   Pulse 76   Ht 5' 9.5" (1.765 m)   Wt 183 lb 8 oz (83.2 kg)  BMI 26.71 kg/m   Constitutional: Well nourished. Alert and oriented, No acute distress. HEENT: [place] AT, moist mucus membranes. Trachea midline, no masses. Cardiovascular: No clubbing, cyanosis, or edema. Respiratory: Normal respiratory effort, no increased work of breathing. Skin: No rashes, bruises or suspicious lesions. Lymph: No cervical or inguinal adenopathy. Neurologic: Grossly intact, no focal deficits, moving all 4 extremities. Psychiatric: Normal mood and affect.  Laboratory Data: Lab Results  Component Value Date   WBC 6.0 07/31/2016   HGB 13.3 07/31/2016   HCT 40.0 07/31/2016   MCV 90.6 07/31/2016   PLT 209 07/31/2016    Lab Results  Component Value Date   CREATININE 1.28 (H) 07/31/2016     Lab Results  Component Value Date   TSH 4.33 01/31/2013       Component Value Date/Time   CHOL 126 01/31/2013 0505   HDL 33 (L) 01/31/2013 0505   VLDL 34 01/31/2013 0505   LDLCALC 59 01/31/2013 0505    Lab Results  Component Value Date   AST 28 07/31/2016   Lab Results  Component Value Date   ALT 20 07/31/2016     PTNS treatment: The needle electrode was inserted into the lower, inner aspect of the patient's left leg. The surface electrode was placed on the inside arch of the foot on the treatment leg. The lead set was connected to the stimulator and the needle electrode clip was connected to the needle electrode. The stimulator that produces an adjustable electrical pulse that travels to the sacral nerve plexus via the tibial nerve was increased to 14  until the patient received a sensory response.     Assessment & Plan:   1. Urge incontinence  - Treatment Plan:  The needle electrode was removed without difficulty to the patient.  Patient tolerated the procedure for 30 minutes.  He will return next week for # 8 out of 12 of their weekly PTNS treatment's   Return in about 1 week (around 09/28/2016) for # 8 PTNS.  These notes generated with voice recognition software. I apologize for typographical errors.  Michiel Cowboy, PA-C  [facility] [address], [address] [place], [place] [address] [phone]

## 2016-09-21 ENCOUNTER — Encounter: Payer: Self-pay | Admitting: Urology

## 2016-09-21 ENCOUNTER — Ambulatory Visit (INDEPENDENT_AMBULATORY_CARE_PROVIDER_SITE_OTHER): Payer: Medicare Other | Admitting: Urology

## 2016-09-21 VITALS — BP 165/91 | HR 76 | Ht 69.5 in | Wt 183.5 lb

## 2016-09-21 DIAGNOSIS — N3941 Urge incontinence: Secondary | ICD-10-CM

## 2016-09-21 NOTE — Progress Notes (Signed)
PTNS  Session # 7  Health & Social Factors: No Change Caffeine: 2 Alcohol: 3  7 oz Daytime voids #per day: 11 Night-time voids #per night: 0 Urgency: Strong Incontinence Episodes #per day: 0 Ankle used: Left Treatment Setting: 14 Feeling/ Response: Sensory  Preformed By: Michiel Cowboy PA-C  Assistant: Dallas Schimke CMA  Follow Up: One week

## 2016-09-27 NOTE — Progress Notes (Deleted)
Chief Complaint:  No chief complaint on file.    HPI: 83 yo WM who presents today to begin a 12 weekly treatments of PTNS.  Today is 8 /12.  The patient with urgency incontinence and frequency assessed by Dr. Thea Silversmith.  He was on mirabegron last visit. He was given Flomax on his last visit. Apparently he has failed Vesicare. He had a 60 g benign prostate on rectal examination He has mild urge incontinence but is not a daily problem. He denies stress incontinence. He intermittently has mild enuresis. He does not wear pads. He voids every 1 to 2R's gets up once at night  During his last visit I explained to him that he had an enlarged prostate but I was treating him primarily for bladder spasms. I did not recommend pelvic surgery. He is here on a trial of Toviaz 8 mg. I do not think a urodynamics which changes management. He has never had cystoscopy and last visit had inserted 2 red blood cells per high power field  The patient had been 60% better with less foot on the floor syndrome and less frequency and urge incontinence on Toviaz. Percutaneous tibial nerve stimulation was discussed last time in April   The last visit the Gala Murdoch was no longer working and he wanted to try one more medication. I gave him oxybutynin ER 10 mg.   Today Frequency stable The patient still has urgency incontinence. He failed oxybutynin as well area did          Previous Therapy:            Pelvic Floor Exercises - not attempted           Biofeedback - not attempted           Bladder Training - not attempted           Fluid Management - not attempted           Dietary Restrictions - not attempted    Contraindications present for PTNS      Pacemaker - YES - patient has cardiac clearance      Implantable defibrillator - NO       History of abnormal bleeding - NO      History of neuropathies or nerve damage - NO  Discussed with patient possible complications of procedure, such as discomfort,  bleeding at insertion/stimulation site, procedure consent signed  Patient goals:     To stop having to go to the bathroom every time he puts a key in the door.    Today, the patient is complaining of frequency and urgency.  He denies dysuria, gross hematuria and suprapubic.  He denies fevers, chills, nausea and vomiting.    Starting symptoms, 8 day time voids, 0 night time voids and zero incontinence episodes daily.  After his seventh treatment, he is experiencing  *** daytime voids (***), *** night time voids (***) and *** incontinence episodes. (***).     PMH: Past Medical History:  Diagnosis Date  . Anxiety   . Depression   . Gout   . Urge incontinence     Surgical History: Past Surgical History:  Procedure Laterality Date  . HERNIA REPAIR    . PACEMAKER INSERTION      Home Medications:  Allergies as of 09/28/2016      Reactions   Colchicine    Doxycycline Other (See Comments)   Meloxicam Other (See Comments)   Sulfa Antibiotics    Other reaction(s):  UNKNOWN   Tussin  [guaifenesin] Other (See Comments)   Amoxicillin-pot Clavulanate Rash      Medication List       Accurate as of 09/27/16 11:44 AM. Always use your most recent med list.          acetaminophen 325 MG tablet Commonly known as:  TYLENOL Take by mouth.   AMBIEN 5 MG tablet Generic drug:  zolpidem Take 5 mg by mouth at bedtime.   amLODipine 5 MG tablet Commonly known as:  NORVASC Take by mouth.   Colchicine 0.6 MG Caps TAKE ONE CAPSULE DAILY   DULoxetine 30 MG capsule Commonly known as:  CYMBALTA Take by mouth.   fesoterodine 8 MG Tb24 tablet Commonly known as:  TOVIAZ Take 1 tablet (8 mg total) by mouth daily.   hydrochlorothiazide 12.5 MG tablet Commonly known as:  HYDRODIURIL Take 1 tablet (12.5 mg total) by mouth daily.   hydrOXYzine 25 MG tablet Commonly known as:  ATARAX/VISTARIL Take 25 mg by mouth at bedtime.   losartan 50 MG tablet Commonly known as:  COZAAR Take by  mouth.   losartan-hydrochlorothiazide 100-12.5 MG tablet Commonly known as:  HYZAAR Take by mouth.   predniSONE 5 MG tablet Commonly known as:  DELTASONE Take 5 mg by mouth as needed.   predniSONE 10 MG (21) Tbpk tablet Commonly known as:  STERAPRED UNI-PAK 21 TAB Take as directed on blister pack   traMADol 50 MG tablet Commonly known as:  ULTRAM Take 50 mg by mouth every 6 (six) hours as needed.       Allergies:  Allergies  Allergen Reactions  . Colchicine   . Doxycycline Other (See Comments)  . Meloxicam Other (See Comments)  . Sulfa Antibiotics     Other reaction(s): UNKNOWN  . Tussin  [Guaifenesin] Other (See Comments)  . Amoxicillin-Pot Clavulanate Rash    Family History: Family History  Problem Relation Age of Onset  . Hematuria Neg Hx   . Kidney cancer Neg Hx   . Prostate cancer Neg Hx   . Bladder Cancer Neg Hx     Social History:  reports that he has been smoking Cigars.  He has never used smokeless tobacco. He reports that he drinks alcohol. He reports that he does not use drugs.  ROS:                                         Physical Exam: There were no vitals taken for this visit.  Constitutional: Well nourished. Alert and oriented, No acute distress. HEENT: [place] AT, moist mucus membranes. Trachea midline, no masses. Cardiovascular: No clubbing, cyanosis, or edema. Respiratory: Normal respiratory effort, no increased work of breathing. Skin: No rashes, bruises or suspicious lesions. Lymph: No cervical or inguinal adenopathy. Neurologic: Grossly intact, no focal deficits, moving all 4 extremities. Psychiatric: Normal mood and affect.  Laboratory Data: Lab Results  Component Value Date   WBC 6.0 07/31/2016   HGB 13.3 07/31/2016   HCT 40.0 07/31/2016   MCV 90.6 07/31/2016   PLT 209 07/31/2016    Lab Results  Component Value Date   CREATININE 1.28 (H) 07/31/2016     Lab Results  Component Value Date   TSH 4.33  01/31/2013       Component Value Date/Time   CHOL 126 01/31/2013 0505   HDL 33 (L) 01/31/2013 0505   VLDL 34 01/31/2013 0505  LDLCALC 59 01/31/2013 0505    Lab Results  Component Value Date   AST 28 07/31/2016   Lab Results  Component Value Date   ALT 20 07/31/2016     PTNS treatment: The needle electrode was inserted into the lower, inner aspect of the patient's *** leg. The surface electrode was placed on the inside arch of the foot on the treatment leg. The lead set was connected to the stimulator and the needle electrode clip was connected to the needle electrode. The stimulator that produces an adjustable electrical pulse that travels to the sacral nerve plexus via the tibial nerve was increased to *** until the patient received a ***   Assessment & Plan:   1. Urge incontinence  - Treatment Plan:  The needle electrode was removed without difficulty to the patient.  Patient tolerated the procedure for 30 minutes.  He will return next week for # 9 out of 12 of their weekly PTNS treatment's   No Follow-up on file.  These notes generated with voice recognition software. I apologize for typographical errors.  Michiel Cowboy, PA-C  [facility] [address] [place], [place] [address] [phone]

## 2016-09-28 ENCOUNTER — Ambulatory Visit: Payer: Medicare Other | Admitting: Urology

## 2016-10-03 ENCOUNTER — Ambulatory Visit (INDEPENDENT_AMBULATORY_CARE_PROVIDER_SITE_OTHER): Payer: Medicare Other | Admitting: Podiatry

## 2016-10-03 ENCOUNTER — Encounter: Payer: Self-pay | Admitting: Podiatry

## 2016-10-03 DIAGNOSIS — M109 Gout, unspecified: Secondary | ICD-10-CM

## 2016-10-03 NOTE — Progress Notes (Signed)
He presents today for follow-up of gout first metatarsophalangeal joint of the right foot. States that it's cleared up 100%.  Objective: Vital signs are stable he is alert and oriented 3 there is no erythema no cellulitis drainage odor great range of motion without crepitation and pain first metatarsophalangeal joint of the right foot mild post inflammatory hyperpigmentation to the medial eminence of the first metatarsal head other than that no signs of gout whatsoever.  Assessment: Well healing capsulitis gouty arthritis first metatarsophalangeal joint right.  Plan: Follow-up with [place] on an as-needed basis.

## 2016-10-04 NOTE — Progress Notes (Signed)
Chief Complaint:  Chief Complaint  Patient presents with  . PTNS    urge incontinence     HPI: 83 yo WM who presents today to begin a 12 weekly treatments of PTNS.  Today is 8 /12.  The patient with urgency incontinence and frequency assessed by Dr. Thea Silversmith.  He was on mirabegron last visit. He was given Flomax on his last visit. Apparently he has failed Vesicare. He had a 60 g benign prostate on rectal examination He has mild urge incontinence but is not a daily problem. He denies stress incontinence. He intermittently has mild enuresis. He does not wear pads. He voids every 1 to 2R's gets up once at night  During his last visit I explained to him that he had an enlarged prostate but I was treating him primarily for bladder spasms. I did not recommend pelvic surgery. He is here on a trial of Toviaz 8 mg. I do not think a urodynamics which changes management. He has never had cystoscopy and last visit had inserted 2 red blood cells per high power field  The patient had been 60% better with less foot on the floor syndrome and less frequency and urge incontinence on Toviaz. Percutaneous tibial nerve stimulation was discussed last time in April   The last visit the Gala Murdoch was no longer working and he wanted to try one more medication. I gave him oxybutynin ER 10 mg.   Today Frequency stable The patient still has urgency incontinence. He failed oxybutynin as well area did          Previous Therapy:            Pelvic Floor Exercises - not attempted           Biofeedback - not attempted           Bladder Training - not attempted           Fluid Management - not attempted           Dietary Restrictions - not attempted    Contraindications present for PTNS      Pacemaker - YES - patient has cardiac clearance      Implantable defibrillator - NO       History of abnormal bleeding - NO      History of neuropathies or nerve damage - NO  Discussed with patient possible  complications of procedure, such as discomfort, bleeding at insertion/stimulation site, procedure consent signed  Patient goals:     To stop having to go to the bathroom every time he puts a key in the door.    Today, the patient is complaining of frequency and urgency.  He denies dysuria, gross hematuria and suprapubic.  He denies fevers, chills, nausea and vomiting.    Starting symptoms, 8 day time voids, 0 night time voids and zero incontinence episodes daily.  After his seventh treatment, he is experiencing  13 daytime voids (worse), 0 night time voids (stable) and 0-1 incontinence episodes. (stable).   His urgency is strong.     PMH: Past Medical History:  Diagnosis Date  . Anxiety   . Depression   . Gout   . Urge incontinence     Surgical History: Past Surgical History:  Procedure Laterality Date  . HERNIA REPAIR    . PACEMAKER INSERTION      Home Medications:  Allergies as of 10/05/2016      Reactions   Colchicine    Doxycycline Other (See  Comments)   Meloxicam Other (See Comments)   Sulfa Antibiotics    Other reaction(s): UNKNOWN   Tussin  [guaifenesin] Other (See Comments)   Amoxicillin-pot Clavulanate Rash      Medication List       Accurate as of 10/05/16  2:25 PM. Always use your most recent med list.          acetaminophen 325 MG tablet Commonly known as:  TYLENOL Take by mouth.   AMBIEN 5 MG tablet Generic drug:  zolpidem Take 5 mg by mouth at bedtime.   amLODipine 5 MG tablet Commonly known as:  NORVASC Take by mouth.   Colchicine 0.6 MG Caps TAKE ONE CAPSULE DAILY   DULoxetine 30 MG capsule Commonly known as:  CYMBALTA Take by mouth.   fesoterodine 8 MG Tb24 tablet Commonly known as:  TOVIAZ Take 1 tablet (8 mg total) by mouth daily.   hydrochlorothiazide 12.5 MG tablet Commonly known as:  HYDRODIURIL Take 1 tablet (12.5 mg total) by mouth daily.   hydrOXYzine 25 MG tablet Commonly known as:  ATARAX/VISTARIL Take 25 mg by mouth at  bedtime.   losartan 50 MG tablet Commonly known as:  COZAAR Take by mouth.   losartan-hydrochlorothiazide 100-12.5 MG tablet Commonly known as:  HYZAAR Take by mouth.   predniSONE 5 MG tablet Commonly known as:  DELTASONE Take 5 mg by mouth as needed.   predniSONE 10 MG (21) Tbpk tablet Commonly known as:  STERAPRED UNI-PAK 21 TAB Take as directed on blister pack   traMADol 50 MG tablet Commonly known as:  ULTRAM Take 50 mg by mouth every 6 (six) hours as needed.       Allergies:  Allergies  Allergen Reactions  . Colchicine   . Doxycycline Other (See Comments)  . Meloxicam Other (See Comments)  . Sulfa Antibiotics     Other reaction(s): UNKNOWN  . Tussin  [Guaifenesin] Other (See Comments)  . Amoxicillin-Pot Clavulanate Rash    Family History: Family History  Problem Relation Age of Onset  . Hematuria Neg Hx   . Kidney cancer Neg Hx   . Prostate cancer Neg Hx   . Bladder Cancer Neg Hx     Social History:  reports that he has been smoking Cigars.  He has never used smokeless tobacco. He reports that he drinks alcohol. He reports that he does not use drugs.  ROS: UROLOGY Frequent Urination?: Yes Hard to postpone urination?: Yes Burning/pain with urination?: No Get up at night to urinate?: No Leakage of urine?: No Urine stream starts and stops?: No Trouble starting stream?: No Do you have to strain to urinate?: No Blood in urine?: No Urinary tract infection?: No Sexually transmitted disease?: No Injury to kidneys or bladder?: No Painful intercourse?: No Weak stream?: No Erection problems?: No Penile pain?: No  Gastrointestinal Nausea?: No Vomiting?: No Indigestion/heartburn?: No Diarrhea?: No Constipation?: No  Constitutional Fever: No Night sweats?: No Weight loss?: No Fatigue?: No  Skin Skin rash/lesions?: No Itching?: No  Eyes Blurred vision?: No Double vision?: No  Ears/Nose/Throat Sore throat?: No Sinus problems?:  No  Hematologic/Lymphatic Swollen glands?: No Easy bruising?: No  Cardiovascular Leg swelling?: No Chest pain?: No  Respiratory Cough?: No Shortness of breath?: No  Endocrine Excessive thirst?: No  Musculoskeletal Back pain?: No Joint pain?: No  Neurological Headaches?: No Dizziness?: No  Psychologic Depression?: No Anxiety?: No   Physical Exam: BP (!) 137/91   Pulse 86   Ht 5' 9.5" (1.765 m)  Wt 180 lb 6.4 oz (81.8 kg)   BMI 26.26 kg/m   Constitutional: Well nourished. Alert and oriented, No acute distress. HEENT: [place] AT, moist mucus membranes. Trachea midline, no masses. Cardiovascular: No clubbing, cyanosis, or edema. Respiratory: Normal respiratory effort, no increased work of breathing. Skin: No rashes, bruises or suspicious lesions. Lymph: No cervical or inguinal adenopathy. Neurologic: Grossly intact, no focal deficits, moving all 4 extremities. Psychiatric: Normal mood and affect.  Laboratory Data: Lab Results  Component Value Date   WBC 6.0 07/31/2016   HGB 13.3 07/31/2016   HCT 40.0 07/31/2016   MCV 90.6 07/31/2016   PLT 209 07/31/2016    Lab Results  Component Value Date   CREATININE 1.28 (H) 07/31/2016     Lab Results  Component Value Date   TSH 4.33 01/31/2013       Component Value Date/Time   CHOL 126 01/31/2013 0505   HDL 33 (L) 01/31/2013 0505   VLDL 34 01/31/2013 0505   LDLCALC 59 01/31/2013 0505    Lab Results  Component Value Date   AST 28 07/31/2016   Lab Results  Component Value Date   ALT 20 07/31/2016     PTNS treatment: The needle electrode was inserted into the lower, inner aspect of the patient's left leg. The surface electrode was placed on the inside arch of the foot on the treatment leg. The lead set was connected to the stimulator and the needle electrode clip was connected to the needle electrode. The stimulator that produces an adjustable electrical pulse that travels to the sacral nerve plexus via  the tibial nerve was increased to 15 until the patient received a both a toe flex and sensory response.     Assessment & Plan:   1. Urge incontinence  - Treatment Plan:  The needle electrode was removed without difficulty to the patient.  Patient tolerated the procedure for 30 minutes.  He will return next week for # 9 out of 12 of their weekly PTNS treatment's   Return in about 1 week (around 10/12/2016) for # 9 PTNS.  These notes generated with voice recognition software. I apologize for typographical errors.  Michiel Cowboy, PA-C  [facility] [address], [address] [place], [place] [address] [phone]

## 2016-10-05 ENCOUNTER — Encounter: Payer: Self-pay | Admitting: Urology

## 2016-10-05 ENCOUNTER — Ambulatory Visit (INDEPENDENT_AMBULATORY_CARE_PROVIDER_SITE_OTHER): Payer: Medicare Other | Admitting: Urology

## 2016-10-05 VITALS — BP 137/91 | HR 86 | Ht 69.5 in | Wt 180.4 lb

## 2016-10-05 DIAGNOSIS — N3941 Urge incontinence: Secondary | ICD-10-CM | POA: Diagnosis not present

## 2016-10-05 LAB — PTNS-PERCUTANEOUS TIBIAL NERVE STIMULATION: SCAN RESULT: 15

## 2016-10-05 NOTE — Progress Notes (Signed)
PTNS  Session # 8  Health & Social Factors: No Change Caffeine: 2 Alcohol: 1  7 oz beer Daytime voids #per day: 13 Night-time voids #per night: 0 Urgency: Strong Incontinence Episodes #per day: 0-1 Ankle used: Left Treatment Setting: 15 Feeling/ Response: Both  Preformed By: Michiel Cowboy PA-C  Assistant: Dallas Schimke CMA  Follow Up: One week

## 2016-10-11 NOTE — Progress Notes (Deleted)
Chief Complaint:  No chief complaint on file.    HPI: 83 yo WM who presents today to begin a 12 weekly treatments of PTNS.  Today is 9 /12.  The patient with urgency incontinence and frequency assessed by Dr. Thea Silversmith.  He was on mirabegron last visit. He was given Flomax on his last visit. Apparently he has failed Vesicare. He had a 60 g benign prostate on rectal examination He has mild urge incontinence but is not a daily problem. He denies stress incontinence. He intermittently has mild enuresis. He does not wear pads. He voids every 1 to 2R's gets up once at night  During his last visit I explained to him that he had an enlarged prostate but I was treating him primarily for bladder spasms. I did not recommend pelvic surgery. He is here on a trial of Toviaz 8 mg. I do not think a urodynamics which changes management. He has never had cystoscopy and last visit had inserted 2 red blood cells per high power field  The patient had been 60% better with less foot on the floor syndrome and less frequency and urge incontinence on Toviaz. Percutaneous tibial nerve stimulation was discussed last time in April   The last visit the Gala Murdoch was no longer working and he wanted to try one more medication. I gave him oxybutynin ER 10 mg.   Today Frequency stable The patient still has urgency incontinence. He failed oxybutynin as well area did          Previous Therapy:            Pelvic Floor Exercises - not attempted           Biofeedback - not attempted           Bladder Training - not attempted           Fluid Management - not attempted           Dietary Restrictions - not attempted    Contraindications present for PTNS      Pacemaker - YES - patient has cardiac clearance      Implantable defibrillator - NO       History of abnormal bleeding - NO      History of neuropathies or nerve damage - NO  Discussed with patient possible complications of procedure, such as discomfort,  bleeding at insertion/stimulation site, procedure consent signed  Patient goals:     To stop having to go to the bathroom every time he puts a key in the door.    Today, the patient is complaining of frequency and urgency.  He denies dysuria, gross hematuria and suprapubic.  He denies fevers, chills, nausea and vomiting.    Starting symptoms, 8 day time voids, 0 night time voids and zero incontinence episodes daily.  After his eigth treatment, he is experiencing *** daytime voids (***), *** night time voids (***) and *** incontinence episodes. (***).   His urgency is strong.     PMH: Past Medical History:  Diagnosis Date  . Anxiety   . Depression   . Gout   . Urge incontinence     Surgical History: Past Surgical History:  Procedure Laterality Date  . HERNIA REPAIR    . PACEMAKER INSERTION      Home Medications:  Allergies as of 10/12/2016      Reactions   Colchicine    Doxycycline Other (See Comments)   Meloxicam Other (See Comments)   Sulfa Antibiotics  Other reaction(s): UNKNOWN   Tussin  [guaifenesin] Other (See Comments)   Amoxicillin-pot Clavulanate Rash      Medication List       Accurate as of 10/11/16 11:43 AM. Always use your most recent med list.          acetaminophen 325 MG tablet Commonly known as:  TYLENOL Take by mouth.   AMBIEN 5 MG tablet Generic drug:  zolpidem Take 5 mg by mouth at bedtime.   amLODipine 5 MG tablet Commonly known as:  NORVASC Take by mouth.   Colchicine 0.6 MG Caps TAKE ONE CAPSULE DAILY   DULoxetine 30 MG capsule Commonly known as:  CYMBALTA Take by mouth.   fesoterodine 8 MG Tb24 tablet Commonly known as:  TOVIAZ Take 1 tablet (8 mg total) by mouth daily.   hydrochlorothiazide 12.5 MG tablet Commonly known as:  HYDRODIURIL Take 1 tablet (12.5 mg total) by mouth daily.   hydrOXYzine 25 MG tablet Commonly known as:  ATARAX/VISTARIL Take 25 mg by mouth at bedtime.   losartan 50 MG tablet Commonly known as:   COZAAR Take by mouth.   losartan-hydrochlorothiazide 100-12.5 MG tablet Commonly known as:  HYZAAR Take by mouth.   predniSONE 5 MG tablet Commonly known as:  DELTASONE Take 5 mg by mouth as needed.   predniSONE 10 MG (21) Tbpk tablet Commonly known as:  STERAPRED UNI-PAK 21 TAB Take as directed on blister pack   traMADol 50 MG tablet Commonly known as:  ULTRAM Take 50 mg by mouth every 6 (six) hours as needed.       Allergies:  Allergies  Allergen Reactions  . Colchicine   . Doxycycline Other (See Comments)  . Meloxicam Other (See Comments)  . Sulfa Antibiotics     Other reaction(s): UNKNOWN  . Tussin  [Guaifenesin] Other (See Comments)  . Amoxicillin-Pot Clavulanate Rash    Family History: Family History  Problem Relation Age of Onset  . Hematuria Neg Hx   . Kidney cancer Neg Hx   . Prostate cancer Neg Hx   . Bladder Cancer Neg Hx     Social History:  reports that he has been smoking Cigars.  He has never used smokeless tobacco. He reports that he drinks alcohol. He reports that he does not use drugs.  ROS:                                         Physical Exam: There were no vitals taken for this visit.  Constitutional: Well nourished. Alert and oriented, No acute distress. HEENT: [place] AT, moist mucus membranes. Trachea midline, no masses. Cardiovascular: No clubbing, cyanosis, or edema. Respiratory: Normal respiratory effort, no increased work of breathing. Skin: No rashes, bruises or suspicious lesions. Lymph: No cervical or inguinal adenopathy. Neurologic: Grossly intact, no focal deficits, moving all 4 extremities. Psychiatric: Normal mood and affect.  Laboratory Data: Lab Results  Component Value Date   WBC 6.0 07/31/2016   HGB 13.3 07/31/2016   HCT 40.0 07/31/2016   MCV 90.6 07/31/2016   PLT 209 07/31/2016    Lab Results  Component Value Date   CREATININE 1.28 (H) 07/31/2016     Lab Results  Component Value  Date   TSH 4.33 01/31/2013       Component Value Date/Time   CHOL 126 01/31/2013 0505   HDL 33 (L) 01/31/2013 0505   VLDL 34  01/31/2013 0505   LDLCALC 59 01/31/2013 0505    Lab Results  Component Value Date   AST 28 07/31/2016   Lab Results  Component Value Date   ALT 20 07/31/2016     PTNS treatment: The needle electrode was inserted into the lower, inner aspect of the patient's *** leg. The surface electrode was placed on the inside arch of the foot on the treatment leg. The lead set was connected to the stimulator and the needle electrode clip was connected to the needle electrode. The stimulator that produces an adjustable electrical pulse that travels to the sacral nerve plexus via the tibial nerve was increased to *** until the patient received a both a ***   Assessment & Plan:   1. Urge incontinence  - Treatment Plan:  The needle electrode was removed without difficulty to the patient.  Patient tolerated the procedure for 30 minutes.  He will return next week for # 10 out of 12 of their weekly PTNS treatment's   No Follow-up on file.  These notes generated with voice recognition software. I apologize for typographical errors.  Michiel Cowboy, PA-C  [facility] [address], [address] [place], [place] [address] [phone]

## 2016-10-12 ENCOUNTER — Ambulatory Visit: Payer: Medicare Other | Admitting: Urology

## 2016-10-17 NOTE — Progress Notes (Signed)
Chief Complaint:  Chief Complaint  Patient presents with  . PTNS    Urge incontinence     HPI: 83 yo WM who presents today to begin a 12 weekly treatments of PTNS.  Today is 9 /12.  The patient with urgency incontinence and frequency assessed by Dr. Thea Silversmith.  He was on mirabegron last visit. He was given Flomax on his last visit. Apparently he has failed Vesicare. He had a 60 g benign prostate on rectal examination He has mild urge incontinence but is not a daily problem. He denies stress incontinence. He intermittently has mild enuresis. He does not wear pads. He voids every 1 to 2R's gets up once at night  During his last visit I explained to him that he had an enlarged prostate but I was treating him primarily for bladder spasms. I did not recommend pelvic surgery. He is here on a trial of Toviaz 8 mg. I do not think a urodynamics which changes management. He has never had cystoscopy and last visit had inserted 2 red blood cells per high power field  The patient had been 60% better with less foot on the floor syndrome and less frequency and urge incontinence on Toviaz. Percutaneous tibial nerve stimulation was discussed last time in April   The last visit the Gala Murdoch was no longer working and he wanted to try one more medication. I gave him oxybutynin ER 10 mg.           Previous Therapy:            Pelvic Floor Exercises - not attempted           Biofeedback - not attempted           Bladder Training - not attempted           Fluid Management - not attempted           Dietary Restrictions - not attempted    Contraindications present for PTNS      Pacemaker - YES - patient has cardiac clearance      Implantable defibrillator - NO       History of abnormal bleeding - NO      History of neuropathies or nerve damage - NO  Discussed with patient possible complications of procedure, such as discomfort, bleeding at insertion/stimulation site, procedure consent  signed  Patient goals:     To stop having to go to the bathroom every time he puts a key in the door.    Today, the patient is complaining of frequency and urgency.  He denies dysuria, gross hematuria and suprapubic.  He denies fevers, chills, nausea and vomiting.    Starting symptoms, 81 day time voids, 0 night time voids and zero incontinence episodes daily.  After his eighth treatment, he is experiencing 9 daytime voids (worse), 0 night time voids (stable) and 1 incontinence episodes. (worse).   His urgency is strong.    Patient is wanting his PSA checked at this time.  He states that Dr. Dareen Piano checked it in the past and it was around five and he feels like it is time to check it again.     PMH: Past Medical History:  Diagnosis Date  . Anxiety   . Depression   . Gout   . Urge incontinence     Surgical History: Past Surgical History:  Procedure Laterality Date  . HERNIA REPAIR    . PACEMAKER INSERTION      Home  Medications:  Allergies as of 10/19/2016      Reactions   Colchicine    Doxycycline Other (See Comments)   Meloxicam Other (See Comments)   Sulfa Antibiotics    Other reaction(s): UNKNOWN   Tussin  [guaifenesin] Other (See Comments)   Amoxicillin-pot Clavulanate Rash      Medication List       Accurate as of 10/19/16  1:35 PM. Always use your most recent med list.          acetaminophen 325 MG tablet Commonly known as:  TYLENOL Take by mouth.   AMBIEN 5 MG tablet Generic drug:  zolpidem Take 5 mg by mouth at bedtime.   amLODipine 5 MG tablet Commonly known as:  NORVASC Take by mouth.   Colchicine 0.6 MG Caps TAKE ONE CAPSULE DAILY   DULoxetine 30 MG capsule Commonly known as:  CYMBALTA Take by mouth.   fesoterodine 8 MG Tb24 tablet Commonly known as:  TOVIAZ Take 1 tablet (8 mg total) by mouth daily.   hydrochlorothiazide 12.5 MG tablet Commonly known as:  HYDRODIURIL Take 1 tablet (12.5 mg total) by mouth daily.   hydrOXYzine 25 MG  tablet Commonly known as:  ATARAX/VISTARIL Take 25 mg by mouth at bedtime.   losartan-hydrochlorothiazide 100-12.5 MG tablet Commonly known as:  HYZAAR Take by mouth.   predniSONE 5 MG tablet Commonly known as:  DELTASONE Take 5 mg by mouth as needed.   predniSONE 10 MG (21) Tbpk tablet Commonly known as:  STERAPRED UNI-PAK 21 TAB Take as directed on blister pack   traMADol 50 MG tablet Commonly known as:  ULTRAM Take 50 mg by mouth every 6 (six) hours as needed.       Allergies:  Allergies  Allergen Reactions  . Colchicine   . Doxycycline Other (See Comments)  . Meloxicam Other (See Comments)  . Sulfa Antibiotics     Other reaction(s): UNKNOWN  . Tussin  [Guaifenesin] Other (See Comments)  . Amoxicillin-Pot Clavulanate Rash    Family History: Family History  Problem Relation Age of Onset  . Hematuria Neg Hx   . Kidney cancer Neg Hx   . Prostate cancer Neg Hx   . Bladder Cancer Neg Hx     Social History:  reports that he has been smoking Cigars.  He has never used smokeless tobacco. He reports that he drinks alcohol. He reports that he does not use drugs.  ROS: UROLOGY Frequent Urination?: Yes Hard to postpone urination?: Yes Burning/pain with urination?: No Get up at night to urinate?: No Leakage of urine?: No Urine stream starts and stops?: No Trouble starting stream?: No Do you have to strain to urinate?: No Blood in urine?: No Urinary tract infection?: No Sexually transmitted disease?: No Injury to kidneys or bladder?: No Painful intercourse?: No Weak stream?: No Erection problems?: No Penile pain?: No  Gastrointestinal Nausea?: No Vomiting?: No Indigestion/heartburn?: No Diarrhea?: No Constipation?: No  Constitutional Fever: No Night sweats?: No Weight loss?: No Fatigue?: No  Skin Skin rash/lesions?: No Itching?: No  Eyes Blurred vision?: No Double vision?: No  Ears/Nose/Throat Sore throat?: No Sinus problems?:  No  Hematologic/Lymphatic Swollen glands?: No Easy bruising?: No  Cardiovascular Leg swelling?: No Chest pain?: No  Respiratory Cough?: No Shortness of breath?: No  Endocrine Excessive thirst?: No  Musculoskeletal Back pain?: No Joint pain?: No  Neurological Headaches?: No Dizziness?: No  Psychologic Depression?: No Anxiety?: No   Physical Exam: BP (!) 165/104   Pulse (!) 105  Ht 5\' 9"  (1.753 m)   Wt 182 lb 6.4 oz (82.7 kg)   BMI 26.94 kg/m   Constitutional: Well nourished. Alert and oriented, No acute distress. HEENT: [place] AT, moist mucus membranes. Trachea midline, no masses. Cardiovascular: No clubbing, cyanosis, or edema. Respiratory: Normal respiratory effort, no increased work of breathing. Skin: No rashes, bruises or suspicious lesions. Lymph: No cervical or inguinal adenopathy. Neurologic: Grossly intact, no focal deficits, moving all 4 extremities. Psychiatric: Normal mood and affect.  Laboratory Data: PSA History  5.02 ng/mL on 08/19/2014  6.28 ng/mL on 07/15/2015  5.37 ng/mL on 09/10/2015  Lab Results  Component Value Date   WBC 6.0 07/31/2016   HGB 13.3 07/31/2016   HCT 40.0 07/31/2016   MCV 90.6 07/31/2016   PLT 209 07/31/2016    Lab Results  Component Value Date   CREATININE 1.28 (H) 07/31/2016     Lab Results  Component Value Date   TSH 4.33 01/31/2013       Component Value Date/Time   CHOL 126 01/31/2013 0505   HDL 33 (L) 01/31/2013 0505   VLDL 34 01/31/2013 0505   LDLCALC 59 01/31/2013 0505    Lab Results  Component Value Date   AST 28 07/31/2016   Lab Results  Component Value Date   ALT 20 07/31/2016     PTNS treatment: The needle electrode was inserted into the lower, inner aspect of the patient's left leg. The surface electrode was placed on the inside arch of the foot on the treatment leg. The lead set was connected to the stimulator and the needle electrode clip was connected to the needle electrode. The  stimulator that produces an adjustable electrical pulse that travels to the sacral nerve plexus via the tibial nerve was increased to 4 until the patient received a sensory response.     Assessment & Plan:   1. Urge incontinence  - Treatment Plan:  The needle electrode was removed without difficulty to the patient.  Patient tolerated the procedure for 30 minutes.  He will return next week for # 10 out of 12 of their weekly PTNS treatment's  2. PSA Screening  - I discussed with the patient the AUA Guideline's (2013) for men aged 70+ years or any man with less than a 10 to 15 year life expectancy that screening is not recommended as the ratio of harm to benefit of screening increases in his age group as this leads to over diagnosis of prostate cancer that does not need to be treated  - He stated he would follow up with his PCP concerning his PSA   Return in about 1 week (around 10/26/2016) for # 10 PTNS.  These notes generated with voice recognition software. I apologize for typographical errors.  Michiel Cowboy, PA-C  [facility] [address] [place], [place] [address] [phone]

## 2016-10-19 ENCOUNTER — Encounter: Payer: Self-pay | Admitting: Urology

## 2016-10-19 ENCOUNTER — Ambulatory Visit: Payer: Medicare Other | Admitting: Podiatry

## 2016-10-19 ENCOUNTER — Ambulatory Visit (INDEPENDENT_AMBULATORY_CARE_PROVIDER_SITE_OTHER): Payer: Medicare Other | Admitting: Urology

## 2016-10-19 VITALS — BP 165/104 | HR 105 | Ht 69.0 in | Wt 182.4 lb

## 2016-10-19 DIAGNOSIS — N3941 Urge incontinence: Secondary | ICD-10-CM | POA: Diagnosis not present

## 2016-10-19 DIAGNOSIS — Z125 Encounter for screening for malignant neoplasm of prostate: Secondary | ICD-10-CM

## 2016-10-19 LAB — BLADDER SCAN AMB NON-IMAGING: SCAN RESULT: 4

## 2016-10-19 NOTE — Progress Notes (Signed)
PTNS  Session # 9  Health & Social Factors: No Change Caffeine: 2 Alcohol: 1 Daytime voids #per day: 9 Night-time voids #per night: 0 Urgency: Strong Incontinence Episodes #per day: 1 Ankle used: Left Treatment Setting: 4 Feeling/ Response: Sensory  Preformed By: Michiel CowboyShannon McGowan PA-C  Assistant: Dallas Schimkeamona Williams CMA  Follow Up: One Week

## 2016-10-26 ENCOUNTER — Encounter: Payer: Self-pay | Admitting: Urology

## 2016-10-26 ENCOUNTER — Ambulatory Visit: Payer: Medicare Other | Admitting: Urology

## 2016-11-02 ENCOUNTER — Ambulatory Visit (INDEPENDENT_AMBULATORY_CARE_PROVIDER_SITE_OTHER): Payer: Medicare Other | Admitting: Urology

## 2016-11-02 ENCOUNTER — Encounter: Payer: Self-pay | Admitting: Urology

## 2016-11-02 VITALS — BP 108/71 | HR 94 | Ht 69.0 in | Wt 182.0 lb

## 2016-11-02 DIAGNOSIS — R350 Frequency of micturition: Secondary | ICD-10-CM | POA: Diagnosis not present

## 2016-11-02 DIAGNOSIS — N3941 Urge incontinence: Secondary | ICD-10-CM | POA: Diagnosis not present

## 2016-11-02 NOTE — Progress Notes (Signed)
Chief Complaint:  Chief Complaint  Patient presents with  . PTNS    #10     HPI: 83 yo WM who presents today to begin a 12 weekly treatments of PTNS.  Today is # 10 /12.  The patient with urgency incontinence and frequency assessed by Dr. Thea SilversmithMackenzie.  He was on mirabegron last visit. He was given Flomax on his last visit. Apparently he has failed Vesicare. He had a 60 g benign prostate on rectal examination He has mild urge incontinence but is not a daily problem. He denies stress incontinence. He intermittently has mild enuresis. He does not wear pads. He voids every 1 to 2R's gets up once at night  During his last visit I explained to him that he had an enlarged prostate but I was treating him primarily for bladder spasms. I did not recommend pelvic surgery. He is here on a trial of Toviaz 8 mg. I do not think a urodynamics which changes management. He has never had cystoscopy and last visit had inserted 2 red blood cells per high power field  The patient had been 60% better with less foot on the floor syndrome and less frequency and urge incontinence on Toviaz. Percutaneous tibial nerve stimulation was discussed last time in April   The last visit the Gala Murdochoviaz was no longer working and he wanted to try one more medication. I gave him oxybutynin ER 10 mg.           Previous Therapy:            Pelvic Floor Exercises - not attempted           Biofeedback - not attempted           Bladder Training - not attempted           Fluid Management - not attempted           Dietary Restrictions - not attempted    Contraindications present for PTNS      Pacemaker - YES - patient has cardiac clearance      Implantable defibrillator - NO       History of abnormal bleeding - NO      History of neuropathies or nerve damage - NO  Discussed with patient possible complications of procedure, such as discomfort, bleeding at insertion/stimulation site, procedure consent signed  Patient  goals:     To stop having to go to the bathroom every time he puts a key in the door.    Today, the patient is complaining of frequency and urgency.  He denies dysuria, gross hematuria and suprapubic.  He denies fevers, chills, nausea and vomiting.    Starting symptoms, 8 day time voids, 0 night time voids and zero incontinence episodes daily.  After his ninth treatment, he is experiencing 8 daytime voids (improved), 0 night time voids (stable) and 1 incontinence episodes. (stable).   His urgency is mild.Marland Kitchen.    PMH: Past Medical History:  Diagnosis Date  . Anxiety   . Depression   . Gout   . Urge incontinence     Surgical History: Past Surgical History:  Procedure Laterality Date  . HERNIA REPAIR    . PACEMAKER INSERTION      Home Medications:  Allergies as of 11/02/2016      Reactions   Colchicine    Doxycycline Other (See Comments)   Meloxicam Other (See Comments)   Sulfa Antibiotics    Other reaction(s): UNKNOWN  Tussin  [guaifenesin] Other (See Comments)   Amoxicillin-pot Clavulanate Rash      Medication List       Accurate as of 11/02/16  2:18 PM. Always use your most recent med list.          acetaminophen 325 MG tablet Commonly known as:  TYLENOL Take by mouth.   AMBIEN 5 MG tablet Generic drug:  zolpidem Take 5 mg by mouth at bedtime.   amLODipine 5 MG tablet Commonly known as:  NORVASC Take by mouth.   Colchicine 0.6 MG Caps TAKE ONE CAPSULE DAILY   DULoxetine 30 MG capsule Commonly known as:  CYMBALTA Take by mouth.   fesoterodine 8 MG Tb24 tablet Commonly known as:  TOVIAZ Take 1 tablet (8 mg total) by mouth daily.   hydrochlorothiazide 12.5 MG tablet Commonly known as:  HYDRODIURIL Take 1 tablet (12.5 mg total) by mouth daily.   hydrOXYzine 25 MG tablet Commonly known as:  ATARAX/VISTARIL Take 25 mg by mouth at bedtime.   losartan-hydrochlorothiazide 100-12.5 MG tablet Commonly known as:  HYZAAR Take by mouth.   predniSONE 5 MG  tablet Commonly known as:  DELTASONE Take 5 mg by mouth as needed.   predniSONE 10 MG (21) Tbpk tablet Commonly known as:  STERAPRED UNI-PAK 21 TAB Take as directed on blister pack   traMADol 50 MG tablet Commonly known as:  ULTRAM Take 50 mg by mouth every 6 (six) hours as needed.       Allergies:  Allergies  Allergen Reactions  . Colchicine   . Doxycycline Other (See Comments)  . Meloxicam Other (See Comments)  . Sulfa Antibiotics     Other reaction(s): UNKNOWN  . Tussin  [Guaifenesin] Other (See Comments)  . Amoxicillin-Pot Clavulanate Rash    Family History: Family History  Problem Relation Age of Onset  . Hematuria Neg Hx   . Kidney cancer Neg Hx   . Prostate cancer Neg Hx   . Bladder Cancer Neg Hx     Social History:  reports that he has been smoking Cigars.  He has never used smokeless tobacco. He reports that he drinks alcohol. He reports that he does not use drugs.  ROS: UROLOGY Frequent Urination?: Yes Hard to postpone urination?: Yes Burning/pain with urination?: No Get up at night to urinate?: No Leakage of urine?: No Urine stream starts and stops?: No Trouble starting stream?: No Do you have to strain to urinate?: No Blood in urine?: No Urinary tract infection?: No Sexually transmitted disease?: No Injury to kidneys or bladder?: No Painful intercourse?: No Weak stream?: No Erection problems?: No Penile pain?: No  Gastrointestinal Nausea?: No Vomiting?: No Indigestion/heartburn?: No Diarrhea?: No Constipation?: No  Constitutional Fever: No Night sweats?: No Weight loss?: No Fatigue?: No  Skin Skin rash/lesions?: No Itching?: No  Eyes Blurred vision?: No Double vision?: No  Ears/Nose/Throat Sore throat?: No Sinus problems?: No  Hematologic/Lymphatic Swollen glands?: No Easy bruising?: No  Cardiovascular Leg swelling?: No Chest pain?: No  Respiratory Cough?: No Shortness of breath?: No  Endocrine Excessive  thirst?: No  Musculoskeletal Back pain?: No Joint pain?: No  Neurological Headaches?: No Dizziness?: No  Psychologic Depression?: No Anxiety?: No   Physical Exam: BP 108/71   Pulse 94   Ht 5\' 9"  (1.753 m)   Wt 182 lb (82.6 kg)   BMI 26.88 kg/m   Constitutional: Well nourished. Alert and oriented, No acute distress. HEENT: [place] AT, moist mucus membranes. Trachea midline, no masses. Cardiovascular: No clubbing,  cyanosis, or edema. Respiratory: Normal respiratory effort, no increased work of breathing. Skin: No rashes, bruises or suspicious lesions. Lymph: No cervical or inguinal adenopathy. Neurologic: Grossly intact, no focal deficits, moving all 4 extremities. Psychiatric: Normal mood and affect.  Laboratory Data: PSA History  5.02 ng/mL on 08/19/2014  6.28 ng/mL on 07/15/2015  5.37 ng/mL on 09/10/2015  Lab Results  Component Value Date   WBC 6.0 07/31/2016   HGB 13.3 07/31/2016   HCT 40.0 07/31/2016   MCV 90.6 07/31/2016   PLT 209 07/31/2016    Lab Results  Component Value Date   CREATININE 1.28 (H) 07/31/2016     Lab Results  Component Value Date   TSH 4.33 01/31/2013       Component Value Date/Time   CHOL 126 01/31/2013 0505   HDL 33 (L) 01/31/2013 0505   VLDL 34 01/31/2013 0505   LDLCALC 59 01/31/2013 0505    Lab Results  Component Value Date   AST 28 07/31/2016   Lab Results  Component Value Date   ALT 20 07/31/2016     PTNS treatment: The needle electrode was inserted into the lower, inner aspect of the patient's left leg. The surface electrode was placed on the inside arch of the foot on the treatment leg. The lead set was connected to the stimulator and the needle electrode clip was connected to the needle electrode. The stimulator that produces an adjustable electrical pulse that travels to the sacral nerve plexus via the tibial nerve was increased to 2 until the patient received a sensory response and a toe flex.     Assessment  & Plan:   1. Urge incontinence  - Treatment Plan:  The needle electrode was removed without difficulty to the patient.  Patient tolerated the procedure for 30 minutes.  He will return next week for # 11 out of 12 of their weekly PTNS treatment's   Return in about 1 week (around 11/09/2016) for # 11/12.  These notes generated with voice recognition software. I apologize for typographical errors.  Michiel Cowboy, PA-C  [facility] [address], [address] [place], [place] [address] [phone]

## 2016-11-02 NOTE — Progress Notes (Signed)
PTNS  Session # 10  Health & Social Factors: same Caffeine: 2 Alcohol: 2 Daytime voids #per day: 8 Night-time voids #per night: 0 Urgency: mild Incontinence Episodes #per day: 0-1 Ankle used: left Treatment Setting: 2 Feeling/ Response: both Comments: na  Preformed By: Angelica RanShannon McGowan,PAC  Assistant: Eligha BridegroomSarah Dashawn Bartnick, CMA  Follow Up: next week

## 2016-11-07 NOTE — Progress Notes (Signed)
Chief Complaint:  Chief Complaint  Patient presents with  . PTNS    Urge incontinence     HPI: 83 yo WM who presents today to begin a 12 weekly treatments of PTNS.  Today is # 11 /12.  The patient with urgency incontinence and frequency assessed by Dr. Thea SilversmithMackenzie.  He was on mirabegron last visit. He was given Flomax on his last visit. Apparently he has failed Vesicare. He had a 60 g benign prostate on rectal examination He has mild urge incontinence but is not a daily problem. He denies stress incontinence. He intermittently has mild enuresis. He does not wear pads. He voids every 1 to 2R's gets up once at night  During his last visit I explained to him that he had an enlarged prostate but I was treating him primarily for bladder spasms. I did not recommend pelvic surgery. He is here on a trial of Toviaz 8 mg. I do not think a urodynamics which changes management. He has never had cystoscopy and last visit had inserted 2 red blood cells per high power field  The patient had been 60% better with less foot on the floor syndrome and less frequency and urge incontinence on Toviaz. Percutaneous tibial nerve stimulation was discussed last time in April   The last visit the Gala Murdochoviaz was no longer working and he wanted to try one more medication. I gave him oxybutynin ER 10 mg.           Previous Therapy:            Pelvic Floor Exercises - not attempted           Biofeedback - not attempted           Bladder Training - not attempted           Fluid Management - not attempted           Dietary Restrictions - not attempted    Contraindications present for PTNS      Pacemaker - YES - patient has cardiac clearance      Implantable defibrillator - NO       History of abnormal bleeding - NO      History of neuropathies or nerve damage - NO  Discussed with patient possible complications of procedure, such as discomfort, bleeding at insertion/stimulation site, procedure consent  signed  Patient goals:     To stop having to go to the bathroom every time he puts a key in the door.    Today, the patient is complaining of frequency and urgency.  He denies dysuria, gross hematuria and suprapubic.  He denies fevers, chills, nausea and vomiting.    Starting symptoms, 8 day time voids, 0 night time voids and zero incontinence episodes daily.  After his 11th treatment, he is experiencing 7 daytime voids (improved), 0 night time voids (stable) and 0 incontinence episodes. (Improved).   His urgency is strong.    PMH: Past Medical History:  Diagnosis Date  . Anxiety   . Depression   . Gout   . Urge incontinence     Surgical History: Past Surgical History:  Procedure Laterality Date  . HERNIA REPAIR    . PACEMAKER INSERTION      Home Medications:  Allergies as of 11/09/2016      Reactions   Colchicine    Doxycycline Other (See Comments)   Meloxicam Other (See Comments)   Sulfa Antibiotics    Other reaction(s): UNKNOWN  Tussin  [guaifenesin] Other (See Comments)   Amoxicillin-pot Clavulanate Rash      Medication List       Accurate as of 11/09/16  1:52 PM. Always use your most recent med list.          acetaminophen 325 MG tablet Commonly known as:  TYLENOL Take by mouth.   AMBIEN 5 MG tablet Generic drug:  zolpidem Take 5 mg by mouth at bedtime.   amLODipine 5 MG tablet Commonly known as:  NORVASC Take by mouth.   Colchicine 0.6 MG Caps TAKE ONE CAPSULE DAILY   DULoxetine 30 MG capsule Commonly known as:  CYMBALTA Take by mouth.   fesoterodine 8 MG Tb24 tablet Commonly known as:  TOVIAZ Take 1 tablet (8 mg total) by mouth daily.   hydrochlorothiazide 12.5 MG tablet Commonly known as:  HYDRODIURIL Take 1 tablet (12.5 mg total) by mouth daily.   hydrOXYzine 25 MG tablet Commonly known as:  ATARAX/VISTARIL Take 25 mg by mouth at bedtime.   losartan-hydrochlorothiazide 100-12.5 MG tablet Commonly known as:  HYZAAR Take by mouth.    predniSONE 5 MG tablet Commonly known as:  DELTASONE Take 5 mg by mouth as needed.   predniSONE 10 MG (21) Tbpk tablet Commonly known as:  STERAPRED UNI-PAK 21 TAB Take as directed on blister pack   traMADol 50 MG tablet Commonly known as:  ULTRAM Take 50 mg by mouth every 6 (six) hours as needed.       Allergies:  Allergies  Allergen Reactions  . Colchicine   . Doxycycline Other (See Comments)  . Meloxicam Other (See Comments)  . Sulfa Antibiotics     Other reaction(s): UNKNOWN  . Tussin  [Guaifenesin] Other (See Comments)  . Amoxicillin-Pot Clavulanate Rash    Family History: Family History  Problem Relation Age of Onset  . Hematuria Neg Hx   . Kidney cancer Neg Hx   . Prostate cancer Neg Hx   . Bladder Cancer Neg Hx     Social History:  reports that he has been smoking Cigars.  He has never used smokeless tobacco. He reports that he drinks alcohol. He reports that he does not use drugs.  ROS: UROLOGY Frequent Urination?: Yes Hard to postpone urination?: Yes Burning/pain with urination?: No Get up at night to urinate?: No Leakage of urine?: No Urine stream starts and stops?: No Trouble starting stream?: No Do you have to strain to urinate?: No Blood in urine?: No Urinary tract infection?: No Sexually transmitted disease?: No Injury to kidneys or bladder?: No Painful intercourse?: No Weak stream?: No Erection problems?: No Penile pain?: No  Gastrointestinal Nausea?: No Vomiting?: No Indigestion/heartburn?: No Diarrhea?: No Constipation?: No  Constitutional Fever: No Night sweats?: No Weight loss?: No Fatigue?: No  Skin Skin rash/lesions?: No Itching?: No  Eyes Blurred vision?: No Double vision?: No  Ears/Nose/Throat Sore throat?: No Sinus problems?: No  Hematologic/Lymphatic Swollen glands?: No Easy bruising?: No  Cardiovascular Leg swelling?: No Chest pain?: No  Respiratory Cough?: No Shortness of breath?:  No  Endocrine Excessive thirst?: No  Musculoskeletal Back pain?: No Joint pain?: No  Neurological Headaches?: No Dizziness?: No  Psychologic Depression?: No Anxiety?: No   Physical Exam: BP (!) 172/93   Pulse 80   Ht 5' 9.5" (1.765 m)   Wt 183 lb 1.6 oz (83.1 kg)   BMI 26.65 kg/m   Constitutional: Well nourished. Alert and oriented, No acute distress. HEENT: [place] AT, moist mucus membranes. Trachea midline, no masses.  Cardiovascular: No clubbing, cyanosis, or edema. Respiratory: Normal respiratory effort, no increased work of breathing. Skin: No rashes, bruises or suspicious lesions. Lymph: No cervical or inguinal adenopathy. Neurologic: Grossly intact, no focal deficits, moving all 4 extremities. Psychiatric: Normal mood and affect.  Laboratory Data: PSA History  5.02 ng/mL on 08/19/2014  6.28 ng/mL on 07/15/2015  5.37 ng/mL on 09/10/2015  Lab Results  Component Value Date   WBC 6.0 07/31/2016   HGB 13.3 07/31/2016   HCT 40.0 07/31/2016   MCV 90.6 07/31/2016   PLT 209 07/31/2016    Lab Results  Component Value Date   CREATININE 1.28 (H) 07/31/2016     Lab Results  Component Value Date   TSH 4.33 01/31/2013       Component Value Date/Time   CHOL 126 01/31/2013 0505   HDL 33 (L) 01/31/2013 0505   VLDL 34 01/31/2013 0505   LDLCALC 59 01/31/2013 0505    Lab Results  Component Value Date   AST 28 07/31/2016   Lab Results  Component Value Date   ALT 20 07/31/2016     PTNS treatment: The needle electrode was inserted into the lower, inner aspect of the patient's left leg. The surface electrode was placed on the inside arch of the foot on the treatment leg. The lead set was connected to the stimulator and the needle electrode clip was connected to the needle electrode. The stimulator that produces an adjustable electrical pulse that travels to the sacral nerve plexus via the tibial nerve was increased to 15 until the patient received a toe flex  and a sensory response   Assessment & Plan:   1. Urge incontinence  - Treatment Plan:  The needle electrode was removed without difficulty to the patient.  Patient tolerated the procedure for 30 minutes.  He will return next week for # 12 out of 12 of their weekly PTNS treatment's   Return in about 1 week (around 11/16/2016) for # 12/12 PTNS.  These notes generated with voice recognition software. I apologize for typographical errors.  Michiel Cowboy, PA-C  [facility] [address] [place], [place] [address] [phone]

## 2016-11-09 ENCOUNTER — Encounter: Payer: Self-pay | Admitting: Urology

## 2016-11-09 ENCOUNTER — Ambulatory Visit (INDEPENDENT_AMBULATORY_CARE_PROVIDER_SITE_OTHER): Payer: Medicare Other | Admitting: Urology

## 2016-11-09 VITALS — BP 172/93 | HR 80 | Ht 69.5 in | Wt 183.1 lb

## 2016-11-09 DIAGNOSIS — N3941 Urge incontinence: Secondary | ICD-10-CM | POA: Diagnosis not present

## 2016-11-09 LAB — PTNS-PERCUTANEOUS TIBIAL NERVE STIMULATION: Scan Result: 15

## 2016-11-09 NOTE — Progress Notes (Signed)
PTNS  Session # 11  Health & Social Factors: No Change Caffeine: 2 Alcohol: 2 Daytime voids #per day: 7 Night-time voids #per night: 0 Urgency: Strong Incontinence Episodes #per day: 0 Ankle used: Left Treatment Setting: 15 Feeling/ Response: Both  Preformed By: Michiel CowboyShannon McGowan PA-C  Assistant: Dallas Schimkeamona Kerry Odonohue CMA  Follow Up: One week

## 2016-11-15 ENCOUNTER — Telehealth: Payer: Self-pay | Admitting: Urology

## 2016-11-15 NOTE — Telephone Encounter (Signed)
Pt called office to cancel his last PTNS, pt states he has completed 11 PTNS and feels it is not helping him at all and desired to cancel his 12th PTNS, pt did state he wanted to keep his follow up appt on 6/25 with Dr. Sherron MondayMacDiarmid. Just an FYI for you. Thanks. Have a good day =)

## 2016-11-16 ENCOUNTER — Ambulatory Visit: Payer: Medicare Other | Admitting: Urology

## 2016-11-21 ENCOUNTER — Ambulatory Visit: Payer: Medicare Other | Admitting: Urology

## 2016-11-28 ENCOUNTER — Encounter: Payer: Self-pay | Admitting: Urology

## 2016-11-28 ENCOUNTER — Ambulatory Visit (INDEPENDENT_AMBULATORY_CARE_PROVIDER_SITE_OTHER): Payer: Medicare Other | Admitting: Urology

## 2016-11-28 VITALS — BP 134/80 | HR 93 | Ht 69.0 in | Wt 181.0 lb

## 2016-11-28 DIAGNOSIS — R32 Unspecified urinary incontinence: Secondary | ICD-10-CM | POA: Diagnosis not present

## 2016-11-28 DIAGNOSIS — N3946 Mixed incontinence: Secondary | ICD-10-CM | POA: Diagnosis not present

## 2016-11-28 MED ORDER — TOLTERODINE TARTRATE ER 4 MG PO CP24: 4.0000 mg | ORAL_CAPSULE | Freq: Every day | ORAL | 11 refills | Status: DC

## 2016-11-28 NOTE — Progress Notes (Signed)
11/28/2016 3:23 PM   Ronald Weber 05-02-1929 [id number]  Referring provider: Lauro Regulus, MD [address] [facility] [place] [facility] [place], [place] [address]  Chief Complaint  Patient presents with  . Urinary Incontinence    HPI: The patient is a 83 year old gentleman with urgency incontinence and frequency assessed by Dr. Thea Silversmith. He was on mirabegron last visit. He was given Flomax on his last visit. Apparently he has failed Vesicare. He had a 60 g benign prostate on rectal examination He has mild urge incontinence but is not a daily problem. He denies stress incontinence. He intermittently has mild enuresis. He does not wear pads. He voids every 1 to 2R's gets up once at night  During his last visit I explained to him that he had an enlarged prostate but I was treating him primarily for bladder spasms. I did not recommend pelvic surgery. He is here on a trial of Toviaz 8 mg. I do not think a urodynamics which changes management. He has never had cystoscopy and last visit had inserted 2 red blood cells per high power fieldFlomax  The patient had been 60% better with less foot on the floor syndrome and less frequency and urge incontinence on Toviaz. Percutaneous tibial nerve stimulation was discussed last time in April   The last visit the Gala Murdoch was no longer working and he wanted to try one more medication. I gave him oxybutynin ER 10 mg. He failed this.   Today The patient stopped percutaneous tibial nerve stimulation after 11 treatments.  The patient still has urgency and if he holds it too long very mild urge incontinence not wearing a pad. He only gets up once at night.         PMH: Past Medical History:  Diagnosis Date  . Anxiety   . Depression   . Gout   . Urge incontinence     Surgical History: Past Surgical History:  Procedure Laterality Date  . HERNIA REPAIR    . PACEMAKER INSERTION      Home Medications:  Allergies as of  11/28/2016      Reactions   Colchicine    Doxycycline Other (See Comments)   Meloxicam Other (See Comments)   Sulfa Antibiotics    Other reaction(s): UNKNOWN   Tussin  [guaifenesin] Other (See Comments)   Amoxicillin-pot Clavulanate Rash      Medication List       Accurate as of 11/28/16  3:23 PM. Always use your most recent med list.          acetaminophen 325 MG tablet Commonly known as:  TYLENOL Take by mouth.   AMBIEN 5 MG tablet Generic drug:  zolpidem Take 5 mg by mouth at bedtime.   hydrOXYzine 25 MG tablet Commonly known as:  ATARAX/VISTARIL Take 25 mg by mouth at bedtime.   losartan-hydrochlorothiazide 100-12.5 MG tablet Commonly known as:  HYZAAR Take by mouth.   predniSONE 20 MG tablet Commonly known as:  DELTASONE   tolterodine 4 MG 24 hr capsule Commonly known as:  DETROL LA Take 1 capsule (4 mg total) by mouth daily.   traMADol 50 MG tablet Commonly known as:  ULTRAM Take 50 mg by mouth every 6 (six) hours as needed.       Allergies:  Allergies  Allergen Reactions  . Colchicine   . Doxycycline Other (See Comments)  . Meloxicam Other (See Comments)  . Sulfa Antibiotics     Other reaction(s): UNKNOWN  .  Tussin  [Guaifenesin] Other (See Comments)  . Amoxicillin-Pot Clavulanate Rash    Family History: Family History  Problem Relation Age of Onset  . Hematuria Neg Hx   . Kidney cancer Neg Hx   . Prostate cancer Neg Hx   . Bladder Cancer Neg Hx     Social History:  reports that he has been smoking Cigars.  He has never used smokeless tobacco. He reports that he drinks alcohol. He reports that he does not use drugs.  ROS: UROLOGY Frequent Urination?: Yes Hard to postpone urination?: Yes Burning/pain with urination?: No Get up at night to urinate?: No Leakage of urine?: No Urine stream starts and stops?: No Trouble starting stream?: No Do you have to strain to urinate?: No Blood in urine?: No Urinary tract infection?: No Sexually  transmitted disease?: No Injury to kidneys or bladder?: No Painful intercourse?: No Weak stream?: No Erection problems?: No Penile pain?: No  Gastrointestinal Nausea?: No Vomiting?: No Indigestion/heartburn?: No Diarrhea?: No Constipation?: No  Constitutional Fever: No Night sweats?: No Weight loss?: No Fatigue?: No  Skin Skin rash/lesions?: No Itching?: No  Eyes Blurred vision?: No Double vision?: No  Ears/Nose/Throat Sore throat?: No Sinus problems?: No  Hematologic/Lymphatic Swollen glands?: No Easy bruising?: No  Cardiovascular Leg swelling?: No Chest pain?: No  Respiratory Cough?: No Shortness of breath?: No  Endocrine Excessive thirst?: No  Musculoskeletal Back pain?: No Joint pain?: No  Neurological Headaches?: No Dizziness?: No  Psychologic Depression?: No Anxiety?: No  Physical Exam: BP 134/80   Pulse 93   Ht 5\' 9"  (1.753 m)   Wt 181 lb (82.1 kg)   BMI 26.73 kg/m   Constitutional:  Alert and oriented, No acute distress.  Laboratory Data: Lab Results  Component Value Date   WBC 6.0 07/31/2016   HGB 13.3 07/31/2016   HCT 40.0 07/31/2016   MCV 90.6 07/31/2016   PLT 209 07/31/2016    Lab Results  Component Value Date   CREATININE 1.28 (H) 07/31/2016    No results found for: PSA  No results found for: TESTOSTERONE  No results found for: HGBA1C  Urinalysis    Component Value Date/Time   COLORURINE YELLOW (A) 07/31/2016 1617   APPEARANCEUR CLEAR (A) 07/31/2016 1617   APPEARANCEUR Clear 09/28/2015 1451   LABSPEC 1.011 07/31/2016 1617   LABSPEC 1.015 01/30/2013 1324   PHURINE 5.0 07/31/2016 1617   GLUCOSEU NEGATIVE 07/31/2016 1617   GLUCOSEU Negative 01/30/2013 1324   HGBUR SMALL (A) 07/31/2016 1617   BILIRUBINUR NEGATIVE 07/31/2016 1617   BILIRUBINUR Negative 09/28/2015 1451   BILIRUBINUR Negative 01/30/2013 1324   KETONESUR NEGATIVE 07/31/2016 1617   PROTEINUR NEGATIVE 07/31/2016 1617   NITRITE NEGATIVE  07/31/2016 1617   LEUKOCYTESUR NEGATIVE 07/31/2016 1617   LEUKOCYTESUR Negative 09/28/2015 1451   LEUKOCYTESUR Negative 01/30/2013 1324    Pertinent Imaging: none  Assessment & Plan:  The patient has run out of treatment options. In my opinion he should not of Botox or a TURP or similar procedure without urodynamics. This was explained. He does not wish to proceed but he laid come back with his son to discuss things again. He wanted to try Detrol recognizing its likely lack of efficacy. My concern for surgery is efficacy and with the knot the urgency was settled down and he is elderly with some medical comorbidities. I would not recommend such if urodynamics demonstrated no obstruction.  There are no diagnoses linked to this encounter.  Return in about 6 weeks (around 01/09/2017).  Reece Packer, MD  [facility] [address] [place], [place] [address] [phone]

## 2017-01-02 ENCOUNTER — Ambulatory Visit: Payer: Medicare Other | Admitting: Urology

## 2017-01-02 ENCOUNTER — Encounter: Payer: Self-pay | Admitting: Urology

## 2017-04-08 ENCOUNTER — Inpatient Hospital Stay
Admission: EM | Admit: 2017-04-08 | Discharge: 2017-04-12 | DRG: 193 | Disposition: A | Discharge status: Skilled Nursing Facility | Payer: Medicare Other | Attending: Internal Medicine | Admitting: Internal Medicine

## 2017-04-08 ENCOUNTER — Encounter: Payer: Self-pay | Admitting: Emergency Medicine

## 2017-04-08 ENCOUNTER — Emergency Department: Payer: Medicare Other

## 2017-04-08 DIAGNOSIS — W19XXXA Unspecified fall, initial encounter: Secondary | ICD-10-CM | POA: Diagnosis present

## 2017-04-08 DIAGNOSIS — F17290 Nicotine dependence, other tobacco product, uncomplicated: Secondary | ICD-10-CM | POA: Diagnosis present

## 2017-04-08 DIAGNOSIS — R778 Other specified abnormalities of plasma proteins: Secondary | ICD-10-CM

## 2017-04-08 DIAGNOSIS — T796XXA Traumatic ischemia of muscle, initial encounter: Secondary | ICD-10-CM | POA: Diagnosis present

## 2017-04-08 DIAGNOSIS — N400 Enlarged prostate without lower urinary tract symptoms: Secondary | ICD-10-CM | POA: Diagnosis present

## 2017-04-08 DIAGNOSIS — F419 Anxiety disorder, unspecified: Secondary | ICD-10-CM | POA: Diagnosis present

## 2017-04-08 DIAGNOSIS — F329 Major depressive disorder, single episode, unspecified: Secondary | ICD-10-CM | POA: Diagnosis present

## 2017-04-08 DIAGNOSIS — J440 Chronic obstructive pulmonary disease with acute lower respiratory infection: Secondary | ICD-10-CM | POA: Diagnosis present

## 2017-04-08 DIAGNOSIS — N179 Acute kidney failure, unspecified: Secondary | ICD-10-CM | POA: Diagnosis present

## 2017-04-08 DIAGNOSIS — Z79891 Long term (current) use of opiate analgesic: Secondary | ICD-10-CM | POA: Diagnosis not present

## 2017-04-08 DIAGNOSIS — I129 Hypertensive chronic kidney disease with stage 1 through stage 4 chronic kidney disease, or unspecified chronic kidney disease: Secondary | ICD-10-CM | POA: Diagnosis present

## 2017-04-08 DIAGNOSIS — J9600 Acute respiratory failure, unspecified whether with hypoxia or hypercapnia: Secondary | ICD-10-CM

## 2017-04-08 DIAGNOSIS — J181 Lobar pneumonia, unspecified organism: Secondary | ICD-10-CM | POA: Diagnosis present

## 2017-04-08 DIAGNOSIS — I509 Heart failure, unspecified: Secondary | ICD-10-CM

## 2017-04-08 DIAGNOSIS — M109 Gout, unspecified: Secondary | ICD-10-CM | POA: Diagnosis present

## 2017-04-08 DIAGNOSIS — R0902 Hypoxemia: Secondary | ICD-10-CM

## 2017-04-08 DIAGNOSIS — J9601 Acute respiratory failure with hypoxia: Secondary | ICD-10-CM | POA: Diagnosis present

## 2017-04-08 DIAGNOSIS — Z888 Allergy status to other drugs, medicaments and biological substances status: Secondary | ICD-10-CM | POA: Diagnosis not present

## 2017-04-08 DIAGNOSIS — Z881 Allergy status to other antibiotic agents status: Secondary | ICD-10-CM | POA: Diagnosis not present

## 2017-04-08 DIAGNOSIS — Z23 Encounter for immunization: Secondary | ICD-10-CM

## 2017-04-08 DIAGNOSIS — Z950 Presence of cardiac pacemaker: Secondary | ICD-10-CM

## 2017-04-08 DIAGNOSIS — R7989 Other specified abnormal findings of blood chemistry: Secondary | ICD-10-CM

## 2017-04-08 DIAGNOSIS — Z79899 Other long term (current) drug therapy: Secondary | ICD-10-CM | POA: Diagnosis not present

## 2017-04-08 DIAGNOSIS — N183 Chronic kidney disease, stage 3 (moderate): Secondary | ICD-10-CM | POA: Diagnosis present

## 2017-04-08 DIAGNOSIS — M6282 Rhabdomyolysis: Secondary | ICD-10-CM

## 2017-04-08 DIAGNOSIS — J189 Pneumonia, unspecified organism: Secondary | ICD-10-CM

## 2017-04-08 DIAGNOSIS — I248 Other forms of acute ischemic heart disease: Secondary | ICD-10-CM | POA: Diagnosis present

## 2017-04-08 LAB — URINALYSIS, COMPLETE (UACMP) WITH MICROSCOPIC
BACTERIA UA: NONE SEEN
BILIRUBIN URINE: NEGATIVE
Glucose, UA: NEGATIVE mg/dL
KETONES UR: 5 mg/dL — AB
Leukocytes, UA: NEGATIVE
Nitrite: NEGATIVE
PH: 5 (ref 5.0–8.0)
Protein, ur: 100 mg/dL — AB
SQUAMOUS EPITHELIAL / LPF: NONE SEEN
Specific Gravity, Urine: 1.015 (ref 1.005–1.030)

## 2017-04-08 LAB — BASIC METABOLIC PANEL
Anion gap: 13 (ref 5–15)
BUN: 39 mg/dL — ABNORMAL HIGH (ref 6–20)
CALCIUM: 9.1 mg/dL (ref 8.9–10.3)
CO2: 20 mmol/L — ABNORMAL LOW (ref 22–32)
CREATININE: 2.2 mg/dL — AB (ref 0.61–1.24)
Chloride: 103 mmol/L (ref 101–111)
GFR calc non Af Amer: 25 mL/min — ABNORMAL LOW (ref >60)
GFR, EST AFRICAN AMERICAN: 29 mL/min — AB (ref >60)
Glucose, Bld: 88 mg/dL (ref 65–99)
Potassium: 4.7 mmol/L (ref 3.5–5.1)
SODIUM: 136 mmol/L (ref 135–145)

## 2017-04-08 LAB — CBC
HCT: 41 % (ref 40.0–52.0)
Hemoglobin: 13.2 g/dL (ref 13.0–18.0)
MCH: 29.1 pg (ref 26.0–34.0)
MCHC: 32.2 g/dL (ref 32.0–36.0)
MCV: 90.4 fL (ref 80.0–100.0)
PLATELETS: 231 10*3/uL (ref 150–440)
RBC: 4.53 MIL/uL (ref 4.40–5.90)
RDW: 15.4 % — ABNORMAL HIGH (ref 11.5–14.5)
WBC: 13.9 10*3/uL — AB (ref 3.8–10.6)

## 2017-04-08 LAB — TROPONIN I
TROPONIN I: 1.8 ng/mL — AB (ref <0.03)
Troponin I: 1.39 ng/mL (ref <0.03)
Troponin I: 0.91 ng/mL (ref <0.03)

## 2017-04-08 LAB — CK: CK TOTAL: 11264 U/L — AB (ref 49–397)

## 2017-04-08 MED ORDER — METHYLPREDNISOLONE SODIUM SUCC 125 MG IJ SOLR
125.0000 mg | Freq: Once | INTRAMUSCULAR | Status: AC
  Administered 2017-04-08: 125 mg via INTRAVENOUS
  Filled 2017-04-08: qty 2

## 2017-04-08 MED ORDER — HYDRALAZINE HCL 20 MG/ML IJ SOLN: 10.0000 mg | INTRAMUSCULAR | Status: DC | PRN

## 2017-04-08 MED ORDER — IPRATROPIUM-ALBUTEROL 0.5-2.5 (3) MG/3ML IN SOLN
3.0000 mL | Freq: Four times a day (QID) | RESPIRATORY_TRACT | Status: DC
  Administered 2017-04-08: 3 mL via RESPIRATORY_TRACT
  Filled 2017-04-08: qty 3

## 2017-04-08 MED ORDER — SERTRALINE HCL 50 MG PO TABS
25.0000 mg | ORAL_TABLET | Freq: Every day | ORAL | Status: DC
  Administered 2017-04-08 – 2017-04-12 (×5): 25 mg via ORAL
  Filled 2017-04-08 (×5): qty 1

## 2017-04-08 MED ORDER — PANTOPRAZOLE SODIUM 40 MG IV SOLR
40.0000 mg | Freq: Two times a day (BID) | INTRAVENOUS | Status: DC
  Administered 2017-04-08 – 2017-04-09 (×3): 40 mg via INTRAVENOUS
  Filled 2017-04-08 (×4): qty 40

## 2017-04-08 MED ORDER — INFLUENZA VAC SPLIT HIGH-DOSE 0.5 ML IM SUSY
0.5000 mL | PREFILLED_SYRINGE | INTRAMUSCULAR | Status: AC
  Administered 2017-04-12: 0.5 mL via INTRAMUSCULAR
  Filled 2017-04-08: qty 0.5

## 2017-04-08 MED ORDER — LEVOFLOXACIN IN D5W 750 MG/150ML IV SOLN: 750.0000 mg | INTRAVENOUS | Status: DC

## 2017-04-08 MED ORDER — LATANOPROST 0.005 % OP SOLN
1.0000 [drp] | Freq: Every day | OPHTHALMIC | Status: DC
  Administered 2017-04-08 – 2017-04-11 (×3): 1 [drp] via OPHTHALMIC
  Filled 2017-04-08: qty 2.5

## 2017-04-08 MED ORDER — ASPIRIN 81 MG PO CHEW
324.0000 mg | CHEWABLE_TABLET | Freq: Once | ORAL | Status: AC
  Administered 2017-04-08: 324 mg via ORAL
  Filled 2017-04-08: qty 4

## 2017-04-08 MED ORDER — SODIUM CHLORIDE 0.9% FLUSH
3.0000 mL | Freq: Two times a day (BID) | INTRAVENOUS | Status: DC
  Administered 2017-04-08 – 2017-04-12 (×8): 3 mL via INTRAVENOUS

## 2017-04-08 MED ORDER — ONDANSETRON HCL 4 MG PO TABS: 4.0000 mg | ORAL_TABLET | Freq: Four times a day (QID) | ORAL | Status: DC | PRN

## 2017-04-08 MED ORDER — ACETAMINOPHEN 325 MG PO TABS
650.0000 mg | ORAL_TABLET | Freq: Four times a day (QID) | ORAL | Status: DC | PRN
Start: 2017-04-08 — End: 2017-04-12
  Administered 2017-04-09 – 2017-04-12 (×7): 650 mg via ORAL
  Filled 2017-04-08 (×8): qty 2

## 2017-04-08 MED ORDER — LOSARTAN POTASSIUM-HCTZ 100-12.5 MG PO TABS: 1.0000 | ORAL_TABLET | Freq: Every day | ORAL | Status: DC

## 2017-04-08 MED ORDER — IPRATROPIUM-ALBUTEROL 0.5-2.5 (3) MG/3ML IN SOLN
3.0000 mL | Freq: Three times a day (TID) | RESPIRATORY_TRACT | Status: DC
  Administered 2017-04-08 – 2017-04-12 (×12): 3 mL via RESPIRATORY_TRACT
  Filled 2017-04-08 (×13): qty 3

## 2017-04-08 MED ORDER — METHYLPREDNISOLONE SODIUM SUCC 125 MG IJ SOLR
60.0000 mg | Freq: Two times a day (BID) | INTRAMUSCULAR | Status: DC
  Administered 2017-04-09 – 2017-04-10 (×4): 60 mg via INTRAVENOUS
  Filled 2017-04-08 (×4): qty 2

## 2017-04-08 MED ORDER — HYDROCHLOROTHIAZIDE 12.5 MG PO CAPS
12.5000 mg | ORAL_CAPSULE | Freq: Every day | ORAL | Status: DC
  Administered 2017-04-08 – 2017-04-09 (×2): 12.5 mg via ORAL
  Filled 2017-04-08 (×2): qty 1

## 2017-04-08 MED ORDER — LEVOFLOXACIN IN D5W 750 MG/150ML IV SOLN
750.0000 mg | Freq: Once | INTRAVENOUS | Status: AC
  Administered 2017-04-08: 750 mg via INTRAVENOUS
  Filled 2017-04-08: qty 150

## 2017-04-08 MED ORDER — DOCUSATE SODIUM 100 MG PO CAPS
100.0000 mg | ORAL_CAPSULE | Freq: Two times a day (BID) | ORAL | Status: DC
  Administered 2017-04-08 – 2017-04-10 (×5): 100 mg via ORAL
  Filled 2017-04-08 (×6): qty 1

## 2017-04-08 MED ORDER — LOSARTAN POTASSIUM 50 MG PO TABS
100.0000 mg | ORAL_TABLET | Freq: Every day | ORAL | Status: DC
  Administered 2017-04-08 – 2017-04-12 (×5): 100 mg via ORAL
  Filled 2017-04-08 (×5): qty 2

## 2017-04-08 MED ORDER — SODIUM CHLORIDE 0.9 % IV SOLN
INTRAVENOUS | Status: DC
  Administered 2017-04-08: 15:00:00 via INTRAVENOUS

## 2017-04-08 MED ORDER — ZOLPIDEM TARTRATE 5 MG PO TABS
5.0000 mg | ORAL_TABLET | Freq: Every day | ORAL | Status: DC
  Administered 2017-04-08 – 2017-04-11 (×4): 5 mg via ORAL
  Filled 2017-04-08 (×4): qty 1

## 2017-04-08 MED ORDER — ONDANSETRON HCL 4 MG/2ML IJ SOLN: 4.0000 mg | Freq: Four times a day (QID) | INTRAMUSCULAR | Status: DC | PRN

## 2017-04-08 MED ORDER — HEPARIN SODIUM (PORCINE) 5000 UNIT/ML IJ SOLN
5000.0000 [IU] | Freq: Three times a day (TID) | INTRAMUSCULAR | Status: DC
  Administered 2017-04-08 – 2017-04-12 (×13): 5000 [IU] via SUBCUTANEOUS
  Filled 2017-04-08 (×13): qty 1

## 2017-04-08 MED ORDER — IPRATROPIUM-ALBUTEROL 0.5-2.5 (3) MG/3ML IN SOLN
3.0000 mL | Freq: Once | RESPIRATORY_TRACT | Status: AC
  Administered 2017-04-08: 3 mL via RESPIRATORY_TRACT
  Filled 2017-04-08: qty 9

## 2017-04-08 MED ORDER — ACETAMINOPHEN 650 MG RE SUPP: 650.0000 mg | Freq: Four times a day (QID) | RECTAL | Status: DC | PRN

## 2017-04-08 MED ORDER — IPRATROPIUM-ALBUTEROL 0.5-2.5 (3) MG/3ML IN SOLN
3.0000 mL | Freq: Once | RESPIRATORY_TRACT | Status: AC
  Administered 2017-04-08: 3 mL via RESPIRATORY_TRACT

## 2017-04-08 MED ORDER — BISACODYL 10 MG RE SUPP: 10.0000 mg | Freq: Every day | RECTAL | Status: DC | PRN

## 2017-04-08 MED ORDER — METOPROLOL TARTRATE 25 MG PO TABS
25.0000 mg | ORAL_TABLET | Freq: Two times a day (BID) | ORAL | Status: DC
  Administered 2017-04-08 – 2017-04-12 (×9): 25 mg via ORAL
  Filled 2017-04-08 (×9): qty 1

## 2017-04-08 MED ORDER — HYDROXYZINE HCL 25 MG PO TABS
25.0000 mg | ORAL_TABLET | Freq: Every day | ORAL | Status: DC
  Administered 2017-04-08 – 2017-04-11 (×4): 25 mg via ORAL
  Filled 2017-04-08 (×4): qty 1

## 2017-04-08 MED ORDER — IPRATROPIUM-ALBUTEROL 0.5-2.5 (3) MG/3ML IN SOLN: 3.0000 mL | RESPIRATORY_TRACT | Status: DC | PRN

## 2017-04-08 MED ORDER — SODIUM CHLORIDE 0.9 % IV SOLN
Freq: Once | INTRAVENOUS | Status: AC
  Administered 2017-04-08: 12:00:00 via INTRAVENOUS

## 2017-04-08 NOTE — Progress Notes (Signed)
Patient admitted from ED after falling at home and being unable to get up or move himself to the phone.  Was lying on floor for 7 hours before son was concerned that he couldn't reach him.  Neuro sing WNL at this time.  He has large bruises on both arms.  Skins tears on both elbows and right shin.  These were cleansed with NS and covered with Mepitel and tegaderm

## 2017-04-08 NOTE — ED Provider Notes (Signed)
[facility] Emergency Department Provider Note    First MD Initiated Contact with Patient 04/08/17 [phone]     (approximate)  I have reviewed the triage vital signs and the nursing notes.   HISTORY  Chief Complaint Fall; Cough; and Weakness    HPI Ronald Weber is a 83 y.o. male with a history of COPD not on home oxygen and recent diagnosis of bronchitis presents with worsening shortness of breath, weakness and fall last night.  Patient fell getting up for a drink of water last night and stayed on the floor since 1 AM.  Patient was unable to get up due to severe lower extremity weakness.  States that he has had episodes of his legs giving out.  Has had a cough.  Was recently started on steroids and Levaquin.  Does not take breathing treatments or inhalers at home.  He smokes 2 cigars a day.  Past Medical History:  Diagnosis Date  . Anxiety   . Depression   . Gout   . Urge incontinence    Family History  Problem Relation Age of Onset  . Hematuria Neg Hx   . Kidney cancer Neg Hx   . Prostate cancer Neg Hx   . Bladder Cancer Neg Hx    Past Surgical History:  Procedure Laterality Date  . HERNIA REPAIR    . PACEMAKER INSERTION     Patient Active Problem List   Diagnosis Date Noted  . CAP (community acquired pneumonia) 04/08/2017  . Acute respiratory failure (HCC) 04/08/2017  . Rhabdomyolysis 04/08/2017  . Elevated troponin 04/08/2017  . Incontinence 08/20/2015  . BPH (benign prostatic hyperplasia) 08/20/2015  . Moderate episode of recurrent major depressive disorder (HCC) 07/15/2015  . Peripheral vascular disease (HCC) 03/31/2015  . Derangement of knee 10/03/2014  . Acute gout 08/06/2014  . Acute gouty arthritis 08/06/2014  . B12 deficiency 04/07/2014  . Absence of sensation 04/07/2014  . Disordered sleep 04/07/2014  . Fothergill's neuralgia 04/07/2014  . DDD (degenerative disc disease), lumbar 10/11/2013  . Neuritis or radiculitis due to  rupture of lumbar intervertebral disc 10/11/2013  . Degenerative arthritis of lumbar spine 10/11/2013  . CAFL (chronic airflow limitation) (HCC) 09/12/2013  . BP (high blood pressure) 09/12/2013  . Chronic obstructive pulmonary disease (HCC) 09/12/2013  . Artificial cardiac pacemaker 01/30/2013  . Benign prostatic hyperplasia with urinary obstruction 04/06/2012  . Benign fibroma of prostate 04/06/2012  . Abnormal prostate specific antigen 04/06/2012  . FOM (frequency of micturition) 04/06/2012  . Elevated prostate specific antigen (PSA) 04/06/2012      Prior to Admission medications   Medication Sig Start Date End Date Taking? Authorizing Provider  hydrOXYzine (ATARAX/VISTARIL) 25 MG tablet Take 25 mg by mouth at bedtime.  04/06/12  Yes [provider]  losartan-hydrochlorothiazide Mauri Reading) 100-12.5 MG tablet Take 1 tablet by mouth.  08/25/16 08/25/17 Yes [provider]  sertraline (ZOLOFT) 25 MG tablet Take 25 mg by mouth daily.   Yes [provider]  tolterodine (DETROL LA) 4 MG 24 hr capsule Take 1 capsule (4 mg total) by mouth daily. 11/28/16  Yes MacDiarmid, Lorin Picket, MD  zolpidem (AMBIEN) 5 MG tablet Take 2.5 mg by mouth at bedtime.  04/06/12  Yes [provider]  acetaminophen (TYLENOL) 325 MG tablet Take 650 mg by mouth as needed.     [provider]  traMADol (ULTRAM) 50 MG tablet Take 50 mg by mouth every 6 (six) hours as needed.  04/06/12  [provider]  TRAVATAN Z 0.004 % SOLN ophthalmic solution Place 1 drop into both eyes daily. 03/10/17   [provider]    Allergies Colchicine; Doxycycline; Meloxicam; Sulfa antibiotics; Tussin  [guaifenesin]; and Amoxicillin-pot clavulanate    Social History Social History  Substance Use Topics  . Smoking status: Current Some Day Smoker    Types: Cigars  . Smokeless tobacco: Never Used     Comment: quit 10 years  . Alcohol use 0.0 oz/week     Comment: occ    Review of  Systems Patient denies headaches, rhinorrhea, blurry vision, numbness, shortness of breath, chest pain, edema, cough, abdominal pain, nausea, vomiting, diarrhea, dysuria, fevers, rashes or hallucinations unless otherwise stated above in HPI. ____________________________________________   PHYSICAL EXAM:  VITAL SIGNS: Vitals:   04/08/17 1235 04/08/17 1300  BP: (!) 149/81 (!) 146/91  Pulse: 75 82  Resp: (!) 25   Temp: 98.7 F (37.1 C)   SpO2: 95% 94%    Constitutional: Alert and oriented. Elderly and frail appearing in moderate resp distress. Eyes: Conjunctivae are normal.  Head: Atraumatic. Nose: No congestion/rhinnorhea. Mouth/Throat: Mucous membranes are moist.   Neck: No stridor. Painless ROM.  Cardiovascular: Normal rate, regular rhythm. Grossly normal heart sounds.  Good peripheral circulation. Respiratory: tachypnea, speaking in short phrases, diminished breathsounds throughout, end expiratory wheeze Gastrointestinal: Soft and nontender. No distention. No abdominal bruits. No CVA tenderness. Genitourinary:  Musculoskeletal: No lower extremity tenderness nor edema.  No joint effusions. Neurologic:  Normal speech and language. No gross focal neurologic deficits are appreciated. No facial droop Skin:  Skin is warm, dry and intact. No rash noted. Psychiatric: Mood and affect are normal. Speech and behavior are normal.  ____________________________________________   LABS (all labs ordered are listed, but only abnormal results are displayed)  Results for orders placed or performed during the hospital encounter of 04/08/17 (from the past 24 hour(s))  Basic metabolic panel     Status: Abnormal   Collection Time: 04/08/17  9:23 AM  Result Value Ref Range   Sodium 136 135 - 145 mmol/L   Potassium 4.7 3.5 - 5.1 mmol/L   Chloride 103 101 - 111 mmol/L   CO2 20 (L) 22 - 32 mmol/L   Glucose, Bld 88 65 - 99 mg/dL   BUN 39 (H) 6 - 20 mg/dL   Creatinine, Ser 9.522.20 (H) 0.61 - 1.24  mg/dL   Calcium 9.1 8.9 - 84.110.3 mg/dL   GFR calc non Af Amer 25 (L) >60 mL/min   GFR calc Af Amer 29 (L) >60 mL/min   Anion gap 13 5 - 15  CBC     Status: Abnormal   Collection Time: 04/08/17  9:23 AM  Result Value Ref Range   WBC 13.9 (H) 3.8 - 10.6 K/uL   RBC 4.53 4.40 - 5.90 MIL/uL   Hemoglobin 13.2 13.0 - 18.0 g/dL   HCT 32.441.0 40.140.0 - 02.752.0 %   MCV 90.4 80.0 - 100.0 fL   MCH 29.1 26.0 - 34.0 pg   MCHC 32.2 32.0 - 36.0 g/dL   RDW 25.315.4 (H) 66.411.5 - 40.314.5 %   Platelets 231 150 - 440 K/uL  CK     Status: Abnormal   Collection Time: 04/08/17  9:34 AM  Result Value Ref Range   Total CK 11,264 (H) 49 - 397 U/L  Troponin I     Status: Abnormal   Collection Time: 04/08/17  9:34 AM  Result Value Ref Range   Troponin  I 1.80 (HH) <0.03 ng/mL  Blood gas, venous     Status: Abnormal (Preliminary result)   Collection Time: 04/08/17  9:49 AM  Result Value Ref Range   FIO2 PENDING    Delivery systems PENDING    pH, Ven 7.30 7.250 - 7.430   pCO2, Ven 45 44.0 - 60.0 mmHg   pO2, Ven PENDING 32.0 - 45.0 mmHg   Bicarbonate 22.1 20.0 - 28.0 mmol/L   Acid-base deficit 4.4 (H) 0.0 - 2.0 mmol/L   O2 Saturation PENDING %   Patient temperature 37.0    Collection site VEIN    Sample type PENDING    ____________________________________________  EKG My review and personal interpretation at Time: 9:24   Indication: weakness  Rate: 80  Rhythm: AV paced Axis: left Other: paced rhythm, otherwise normal intervals ____________________________________________  RADIOLOGY  I personally reviewed all radiographic images ordered to evaluate for the above acute complaints and reviewed radiology reports and findings.  These findings were personally discussed with the patient.  Please see medical record for radiology report.  ____________________________________________   PROCEDURES  Procedure(s) performed:  Procedures    Critical Care performed: yes CRITICAL CARE Performed by: Willy Eddy   Total critical care time: 30 minutes  Critical care time was exclusive of separately billable procedures and treating other patients.  Critical care was necessary to treat or prevent imminent or life-threatening deterioration.  Critical care was time spent personally by me on the following activities: development of treatment plan with patient and/or surrogate as well as nursing, discussions with consultants, evaluation of patient's response to treatment, examination of patient, obtaining history from patient or surrogate, ordering and performing treatments and interventions, ordering and review of laboratory studies, ordering and review of radiographic studies, pulse oximetry and re-evaluation of patient's condition.  ____________________________________________   INITIAL IMPRESSION / ASSESSMENT AND PLAN / ED COURSE  Pertinent labs & imaging results that were available during my care of the patient were reviewed by me and considered in my medical decision making (see chart for details).  DDX: Asthma, copd, CHF, pna, ptx, malignancy, Pe, anemia   RIDGE LAFOND is a 83 y.o. who presents to the ED with weakness shortness of breath and cough as described above with being found down on the ground since earlier this morning.  Patient with new hypoxia moderate respiratory distress therefore he was given nebulizer treatments, IV Solu-Medrol and placed on supplemental oxygen.  Abdominal exam is soft and benign.  Blood work sent for the above differential.  Based on his lung exam seems less consistent with pulmonary embolism  Clinical Course as of Apr 09 1443  Sat Apr 08, 2017  1151 Blood work does show evidence of markedly elevated CK to nearly 12,000.  Do suspect troponin elevation is correlated of this is the patient denies any chest pains in the past 24-48 hours particularly in the setting of his AK I elevated troponin is of uncertain significance.  Patient will be given oral aspirin.   Does feel some improvement after nebulizer treatments.  Based on his acute hypoxia and elevated CK do feel the patient will require admission to the hospital for further medical management.  Have discussed with the patient and available family all diagnostics and treatments performed thus far and all questions were answered to the best of my ability. The patient demonstrates understanding and agreement with plan.   [PR]    Clinical Course User Index [PR] Willy Eddy, MD     ____________________________________________  FINAL CLINICAL IMPRESSION(S) / ED DIAGNOSES  Final diagnoses:  Acute respiratory failure with hypoxia (HCC)  Traumatic rhabdomyolysis, initial encounter (HCC)  AKI (acute kidney injury) (HCC)      NEW MEDICATIONS STARTED DURING THIS VISIT:  Current Discharge Medication List       Note:  This document was prepared using Dragon voice recognition software and may include unintentional dictation errors.    Willy Eddy, MD 04/08/17 1444

## 2017-04-08 NOTE — ED Notes (Addendum)
Attempted to call report to 1A. Marchelle FolksAmanda, Charge RN [facility] and [facility] will be [facility] to [facility].

## 2017-04-08 NOTE — ED Triage Notes (Signed)
Pt arrived via EMS from home for reports of fall. Pt states he got up to get a drink of water and his legs gave out on him. Pt reports was unable to get up from floor and laid in floor since 0100. Pt reports is currently being treated for bronchitis and feels weak. Denies pain. Pt alert and oriented on arrival. EMS reports CBG 134, 160/94, 12 lead consistent with pacer.

## 2017-04-08 NOTE — Progress Notes (Addendum)
PT Cancellation Note  Patient Details Name: Ronald Weber MRN: [id number] DOB: 1929/03/11   Cancelled Treatment:    Reason Eval/Treat Not Completed: Medical issues which prohibited therapy. Chart reviewed, PT eval held at this time due to elevated troponin without established trend, still awaiting 2nd value. Cardiac consult pending. Referring physician has changed PT eval order from starting 11/3 to now starting on 11/4. Will follow remotely and attempt again at later daste/time once patient is more medically appropriate.    2:48 PM, 04/08/17 Ronald Weber, PT, DPT Physical Therapist - [place] [phone](ASCOM)  [phone] (mobile)      Delora Gravatt C 04/08/2017, 2:47 PM

## 2017-04-08 NOTE — H&P (Signed)
History and Physical    Ronald Weber:[id number] DOB: 09-06-1928 DOA: 04/08/2017  Referring physician: Dr.Robinson PCP: Ronald Regulus, MD  Specialists: none  Chief Complaint: weakness and SOB  HPI: Ronald Weber is a 83 y.o. male has a past medical history significant for anxiety/depression, and CHB s/p pacer who has been weak and SOB recently. Fell last night and remained on the foor over night until he was found by family and sent to ER. In Er, pt hypoxic with LLL pneumonia. CPK severely elevated c/w rhabdo and troponin elevated as well. Denies Cp. He is now admitted. Requiring O2 in the ER despite IVF steroids and SVn's  Review of Systems: The patient denies anorexia, fever, weight loss,, vision loss, decreased hearing, hoarseness, chest pain, syncope, peripheral edema, balance deficits, hemoptysis, abdominal pain, melena, hematochezia, severe indigestion/heartburn, hematuria, incontinence, genital sores, muscle weakness, suspicious skin lesions, transient blindness, difficulty walking, depression, unusual weight change, abnormal bleeding, enlarged lymph nodes, angioedema, and breast masses.   Past Medical History:  Diagnosis Date  . Anxiety   . Depression   . Gout   . Urge incontinence    Past Surgical History:  Procedure Laterality Date  . HERNIA REPAIR    . PACEMAKER INSERTION     Social History:  reports that he has been smoking Cigars.  He has never used smokeless tobacco. He reports that he drinks alcohol. He reports that he does not use drugs.  Allergies  Allergen Reactions  . Colchicine   . Doxycycline Other (See Comments)  . Meloxicam Other (See Comments)  . Sulfa Antibiotics     Other reaction(s): UNKNOWN  . Tussin  [Guaifenesin] Other (See Comments)  . Amoxicillin-Pot Clavulanate Rash    Family History  Problem Relation Age of Onset  . Hematuria Neg Hx   . Kidney cancer Neg Hx   . Prostate cancer Neg Hx   . Bladder Cancer Neg Hx     Prior to  Admission medications   Medication Sig Start Date End Date Taking? Authorizing Provider  hydrOXYzine (ATARAX/VISTARIL) 25 MG tablet Take 25 mg by mouth at bedtime.  04/06/12  Yes [provider]  losartan-hydrochlorothiazide Mauri Reading) 100-12.5 MG tablet Take 1 tablet by mouth.  08/25/16 08/25/17 Yes [provider]  sertraline (ZOLOFT) 25 MG tablet Take 25 mg by mouth daily.   Yes [provider]  tolterodine (DETROL LA) 4 MG 24 hr capsule Take 1 capsule (4 mg total) by mouth daily. 11/28/16  Yes MacDiarmid, Lorin Picket, MD  zolpidem (AMBIEN) 5 MG tablet Take 2.5 mg by mouth at bedtime.  04/06/12  Yes [provider]  acetaminophen (TYLENOL) 325 MG tablet Take 650 mg by mouth as needed.     [provider]  traMADol (ULTRAM) 50 MG tablet Take 50 mg by mouth every 6 (six) hours as needed.  04/06/12   [provider]  TRAVATAN Z 0.004 % SOLN ophthalmic solution Place 1 drop into both eyes daily. 03/10/17   [provider]   Physical Exam: Vitals:   04/08/17 0928 04/08/17 1030 04/08/17 1100 04/08/17 1130  BP:  (!) 163/96 (!) 150/82 (!) 166/93  Pulse: 90 81 81 83  Resp: (!) 22   19  Temp:      TempSrc:      SpO2: (!) 89% 96% 90% 94%  Weight:      Height:         General:  No apparent distress, WDWN, [place]/AT  Eyes: PERRL, EOMI,  no scleral icterus, conjunctiva clear  ENT: moist oropharynx without exudate, TM's benign, dentition fair  Neck: supple, no lymphadenopathy. No bruits or thyromegaly  Cardiovascular: regular rate without MRG; 2+ peripheral pulses, no JVD, no peripheral edema  Respiratory: left-sided rhonchi with dullness at left base. No wheezes or rales. Respiratory effort increased  Abdomen: soft, non tender to palpation, positive bowel sounds, no guarding, no rebound  Skin: no rashes or lesions  Musculoskeletal: normal bulk and tone, no joint swelling  Psychiatric: normal mood and affect, A&OX3  Neurologic: CN 2-12  grossly intact, Motor strength 5/5 in all 4 groups with symmetric DTR's and non-focal sensory exam  Labs on Admission:  Basic Metabolic Panel:  Recent Labs Lab 04/08/17 0923  NA 136  K 4.7  CL 103  CO2 20*  GLUCOSE 88  BUN 39*  CREATININE 2.20*  CALCIUM 9.1   Liver Function Tests: No results for input(s): AST, ALT, ALKPHOS, BILITOT, PROT, ALBUMIN in the last 168 hours. No results for input(s): LIPASE, AMYLASE in the last 168 hours. No results for input(s): AMMONIA in the last 168 hours. CBC:  Recent Labs Lab 04/08/17 0923  WBC 13.9*  HGB 13.2  HCT 41.0  MCV 90.4  PLT 231   Cardiac Enzymes:  Recent Labs Lab 04/08/17 0934  CKTOTAL 11,264*  TROPONINI 1.80*    BNP (last 3 results) No results for input(s): BNP in the last 8760 hours.  ProBNP (last 3 results) No results for input(s): PROBNP in the last 8760 hours.  CBG: No results for input(s): GLUCAP in the last 168 hours.  Radiological Exams on Admission: Dg Chest 2 View  Result Date: 04/08/2017 CLINICAL DATA:  Pain after fall. EXAM: CHEST  2 VIEW COMPARISON:  July 31, 2016 FINDINGS: Left basilar infiltrate is identified, best seen on the lateral view. The heart, hila, mediastinum, lungs, and pleura are otherwise unremarkable. A 2 lead pacemaker is stable. IMPRESSION: Left basilar infiltrate concerning for pneumonia or aspiration, best seen on the lateral view. Recommend follow-up to resolution. Electronically Signed   By: Gerome Samavid  Williams III M.D   On: 04/08/2017 10:05    EKG: Independently reviewed.  Assessment/Plan Principal Problem:   Acute respiratory failure (HCC) Active Problems:   CAP (community acquired pneumonia)   Rhabdomyolysis   Elevated troponin   Will admit to floor with IV fluids, O2, IV steroids, and IV ABX with SVN's. Follow troponin and CK's. Echo ordered. Consult Cardiology. Wean O2 as tolerated. Repeat labs in AM. Consult PT and CM  Diet: clear liquids Fluids: NS@100  DVT  Prophylaxis: SQ Heparin  Code Status: FULL  Family Communication: none  Disposition Plan: home  Time spent: 50 min

## 2017-04-08 NOTE — Plan of Care (Signed)
Problem: Education: Goal: Knowledge of  General Education information/materials will improve Outcome: Progressing Orientation to room, unit routine, food service

## 2017-04-08 NOTE — ED Notes (Signed)
Date and time results received: 04/08/17 10:58 AM (use smartphrase ".now" to insert current time)  Test:troponin Critical Value: 1.8  Name of Provider Notified: Dr. Roxan Hockeyobinson  Orders Received? Or Actions Taken?: Orders Received - See Orders for details

## 2017-04-09 ENCOUNTER — Encounter: Payer: Self-pay | Admitting: Internal Medicine

## 2017-04-09 ENCOUNTER — Inpatient Hospital Stay
Admission: EM | Admit: 2017-04-09 | Discharge: 2017-04-09 | Disposition: A | Discharge status: Home / Self Care | Payer: Medicare Other | Source: Home / Self Care | Attending: Internal Medicine | Admitting: Internal Medicine

## 2017-04-09 DIAGNOSIS — R778 Other specified abnormalities of plasma proteins: Secondary | ICD-10-CM

## 2017-04-09 DIAGNOSIS — R7989 Other specified abnormal findings of blood chemistry: Principal | ICD-10-CM

## 2017-04-09 LAB — COMPREHENSIVE METABOLIC PANEL
ALK PHOS: 44 U/L (ref 38–126)
ALT: 56 U/L (ref 17–63)
AST: 178 U/L — AB (ref 15–41)
Albumin: 2.8 g/dL — ABNORMAL LOW (ref 3.5–5.0)
Anion gap: 9 (ref 5–15)
BUN: 34 mg/dL — AB (ref 6–20)
CALCIUM: 8.2 mg/dL — AB (ref 8.9–10.3)
CO2: 22 mmol/L (ref 22–32)
CREATININE: 1.73 mg/dL — AB (ref 0.61–1.24)
Chloride: 105 mmol/L (ref 101–111)
GFR, EST AFRICAN AMERICAN: 39 mL/min — AB (ref >60)
GFR, EST NON AFRICAN AMERICAN: 34 mL/min — AB (ref >60)
Glucose, Bld: 100 mg/dL — ABNORMAL HIGH (ref 65–99)
Potassium: 4.7 mmol/L (ref 3.5–5.1)
SODIUM: 136 mmol/L (ref 135–145)
Total Bilirubin: 1 mg/dL (ref 0.3–1.2)
Total Protein: 5.6 g/dL — ABNORMAL LOW (ref 6.5–8.1)

## 2017-04-09 LAB — CBC
HCT: 36.5 % — ABNORMAL LOW (ref 40.0–52.0)
Hemoglobin: 12.1 g/dL — ABNORMAL LOW (ref 13.0–18.0)
MCH: 29.7 pg (ref 26.0–34.0)
MCHC: 33 g/dL (ref 32.0–36.0)
MCV: 89.9 fL (ref 80.0–100.0)
PLATELETS: 168 10*3/uL (ref 150–440)
RBC: 4.06 MIL/uL — AB (ref 4.40–5.90)
RDW: 14.9 % — AB (ref 11.5–14.5)
WBC: 9.7 10*3/uL (ref 3.8–10.6)

## 2017-04-09 LAB — ECHOCARDIOGRAM COMPLETE
AV Peak grad: 11 mmHg
AVPKVEL: 168 cm/s
CHL CUP MV DEC (S): 366
E decel time: 366 msec
HEIGHTINCHES: 70 in
MV Peak grad: 2 mmHg
MV pk E vel: 70.8 m/s
MVAP: 2.06 cm2
MVPKAVEL: 111 m/s
P 1/2 time: 107 ms
Weight: 2827.2 oz

## 2017-04-09 LAB — CK: Total CK: 6702 U/L — ABNORMAL HIGH (ref 49–397)

## 2017-04-09 LAB — TROPONIN I: TROPONIN I: 0.6 ng/mL — AB (ref <0.03)

## 2017-04-09 MED ORDER — LEVOFLOXACIN 750 MG PO TABS
750.0000 mg | ORAL_TABLET | ORAL | Status: DC
  Administered 2017-04-10 – 2017-04-12 (×2): 750 mg via ORAL
  Filled 2017-04-09 (×3): qty 1

## 2017-04-09 MED ORDER — PANTOPRAZOLE SODIUM 40 MG PO TBEC
40.0000 mg | DELAYED_RELEASE_TABLET | Freq: Two times a day (BID) | ORAL | Status: DC
  Administered 2017-04-09 – 2017-04-10 (×2): 40 mg via ORAL
  Filled 2017-04-09 (×2): qty 1

## 2017-04-09 MED ORDER — PERFLUTREN LIPID MICROSPHERE
1.0000 mL | INTRAVENOUS | Status: AC | PRN
  Administered 2017-04-09: 2 mL via INTRAVENOUS
  Filled 2017-04-09: qty 10

## 2017-04-09 MED ORDER — ASPIRIN EC 81 MG PO TBEC
81.0000 mg | DELAYED_RELEASE_TABLET | Freq: Every day | ORAL | Status: DC
  Administered 2017-04-09 – 2017-04-12 (×4): 81 mg via ORAL
  Filled 2017-04-09 (×4): qty 1

## 2017-04-09 NOTE — Progress Notes (Signed)
MD Sudini was notified that pt has a lot of crackles and could we decrease IVF. Order was to d/c IVF.

## 2017-04-09 NOTE — Consult Note (Signed)
[facility] Cardiology Consultation Note  Patient ID: Ronald Weber, MRN: [id number], DOB/AGE: December 14, 1928 83 y.o. Admit date: 04/08/2017   Date of Consult: 04/09/2017 Primary Physician: Lauro Regulus, MD Primary Cardiologist: Paraschos  Chief Complaint:  Chief Complaint  Patient presents with  . Fall  . Cough  . Weakness   Reason for Consult: elevated troponin  HPI: 83 y.o. male with the known heart block status post dual-chamber pacemaker placement and no previous history of coronary artery disease. The patient does have hypertension which she has had recent change in his blood pressure medication and improved. The patient has had been a smoker and recently has had increase of shortness of breath cough and congestion for which she was considered bronchitis. The patient was placed on appropriate antibiotics and then had weakness fatigue and fell. After falling the patient was lying on the floor for quite some time with an elevated CK-MB total. This is consistent with some mild rhabdomyolysis for which the patient also has had some chronic kidney disease in the history which may be exacerbated. The patient also had an elevated troponin of 1.6 more consistent with the acute non-ST elevation myocardial infarction as well. This may be secondary to the current illness pneumonia bronchitis hypoxia or coronary artery disease. We have discussed at length with the patient and the family that we will medically manage due to no evidence of chest discomfort prior to or currently. This may also require additional intervention including cardiac catheterization after patient recovers from his pneumonia hypoxia illness and worsening chronic kidney disease.  Past Medical History:  Diagnosis Date  . Anxiety   . Depression   . Gout   . Urge incontinence       Surgical History:  Past Surgical History:  Procedure Laterality Date  . HERNIA REPAIR    . PACEMAKER INSERTION       Home Meds: Prior  to Admission medications   Medication Sig Start Date End Date Taking? Authorizing Provider  hydrOXYzine (ATARAX/VISTARIL) 25 MG tablet Take 25 mg by mouth at bedtime.  04/06/12  Yes [provider]  losartan-hydrochlorothiazide Mauri Reading) 100-12.5 MG tablet Take 1 tablet by mouth.  08/25/16 08/25/17 Yes [provider]  sertraline (ZOLOFT) 25 MG tablet Take 25 mg by mouth daily.   Yes [provider]  tolterodine (DETROL LA) 4 MG 24 hr capsule Take 1 capsule (4 mg total) by mouth daily. 11/28/16  Yes MacDiarmid, Lorin Picket, MD  zolpidem (AMBIEN) 5 MG tablet Take 2.5 mg by mouth at bedtime.  04/06/12  Yes [provider]  acetaminophen (TYLENOL) 325 MG tablet Take 650 mg by mouth as needed.     [provider]  traMADol (ULTRAM) 50 MG tablet Take 50 mg by mouth every 6 (six) hours as needed.  04/06/12   [provider]  TRAVATAN Z 0.004 % SOLN ophthalmic solution Place 1 drop into both eyes daily. 03/10/17   [provider]    Inpatient Medications:  . docusate sodium  100 mg Oral BID  . heparin  5,000 Units Subcutaneous Q8H  . hydrOXYzine  25 mg Oral QHS  . Influenza vac split quadrivalent PF  0.5 mL Intramuscular Tomorrow-1000  . ipratropium-albuterol  3 mL Nebulization TID  . latanoprost  1 drop Both Eyes QHS  . losartan  100 mg Oral Daily  . methylPREDNISolone (SOLU-MEDROL) injection  60 mg Intravenous Q12H  . metoprolol tartrate  25 mg Oral BID  . pantoprazole (PROTONIX) IV  40  mg Intravenous Q12H  . sertraline  25 mg Oral Daily  . sodium chloride flush  3 mL Intravenous Q12H  . zolpidem  5 mg Oral QHS   . sodium chloride 50 mL/hr at 04/09/17 0924  . [START ON 04/10/2017] levofloxacin (LEVAQUIN) IV      Allergies:  Allergies  Allergen Reactions  . Colchicine   . Doxycycline Other (See Comments)  . Meloxicam Other (See Comments)  . Sulfa Antibiotics     Other reaction(s): UNKNOWN  . Tussin  [Guaifenesin] Other (See Comments)   . Amoxicillin-Pot Clavulanate Rash    Social History   Socioeconomic History  . Marital status: Widowed    Spouse name: Not on file  . Number of children: Not on file  . Years of education: Not on file  . Highest education level: Not on file  Social Needs  . Financial resource strain: Not on file  . Food insecurity - worry: Not on file  . Food insecurity - inability: Not on file  . Transportation needs - medical: Not on file  . Transportation needs - non-medical: Not on file  Occupational History  . Not on file  Tobacco Use  . Smoking status: Current Some Day Smoker    Types: Cigars  . Smokeless tobacco: Never Used  . Tobacco comment: quit 10 years  Substance and Sexual Activity  . Alcohol use: Yes    Alcohol/week: 0.0 oz    Comment: occ  . Drug use: No  . Sexual activity: Not on file  Other Topics Concern  . Not on file  Social History Narrative  . Not on file     Family History  Problem Relation Age of Onset  . Hematuria Neg Hx   . Kidney cancer Neg Hx   . Prostate cancer Neg Hx   . Bladder Cancer Neg Hx      Review of Systems Positive for cough congestion falling Negative for: General:  chills, fever, night sweats or weight changes.  Cardiovascular: PND orthopnea syncope dizziness  Dermatological skin lesions rashes Respiratory: Positive for Cough congestion Urologic: Frequent urination urination at night and hematuria Abdominal: negative for nausea, vomiting, diarrhea, bright red blood per rectum, melena, or hematemesis Neurologic: negative for visual changes, and/or hearing changes  All other systems reviewed and are otherwise negative except as noted above.  Labs: Recent Labs    04/08/17 0934 04/08/17 1410 04/08/17 1934 04/09/17 0120 04/09/17 0153  CKTOTAL 11,264*  --   --  5,1886,702*  --   TROPONINI 1.80* 1.39* 0.91*  --  0.60*   Lab Results  Component Value Date   WBC 9.7 04/09/2017   HGB 12.1 (L) 04/09/2017   HCT 36.5 (L) 04/09/2017   MCV  89.9 04/09/2017   PLT 168 04/09/2017    Recent Labs  Lab 04/09/17 0120  NA 136  K 4.7  CL 105  CO2 22  BUN 34*  CREATININE 1.73*  CALCIUM 8.2*  PROT 5.6*  BILITOT 1.0  ALKPHOS 44  ALT 56  AST 178*  GLUCOSE 100*   Lab Results  Component Value Date   CHOL 126 01/31/2013   HDL 33 (L) 01/31/2013   LDLCALC 59 01/31/2013   TRIG 172 01/31/2013   No results found for: DDIMER  Radiology/Studies:  Dg Chest 2 View  Result Date: 04/08/2017 CLINICAL DATA:  Pain after fall. EXAM: CHEST  2 VIEW COMPARISON:  July 31, 2016 FINDINGS: Left basilar infiltrate is identified, best seen on the lateral view. The  heart, hila, mediastinum, lungs, and pleura are otherwise unremarkable. A 2 lead pacemaker is stable. IMPRESSION: Left basilar infiltrate concerning for pneumonia or aspiration, best seen on the lateral view. Recommend follow-up to resolution. Electronically Signed   By: Gerome Sam III M.D   On: 04/08/2017 10:05    EKG: AV pace  Weights: Filed Weights   04/08/17 0920 04/09/17 0351  Weight: 83 kg (183 lb) 80.2 kg (176 lb 11.2 oz)     Physical Exam: Blood pressure (!) 155/70, pulse 60, temperature (!) 97.5 F (36.4 C), temperature source Oral, resp. rate 18, height 5\' 10"  (1.778 m), weight 80.2 kg (176 lb 11.2 oz), SpO2 91 %. Body mass index is 25.35 kg/m. General: Well developed, well nourished, in no acute distress. Head eyes ears nose throat: Normocephalic, atraumatic, sclera non-icteric, no xanthomas, nares are without discharge. No apparent thyromegaly and/or mass  Lungs: Normal respiratory effort. Some wheezes, no rales, diffuse rhonchi.  Heart: RRR with normal S1 S2. no murmur gallop, no rub, PMI is normal size and placement, carotid upstroke normal without bruit, jugular venous pressure is normal Abdomen: Soft, non-tender, non-distended with normoactive bowel sounds. No hepatomegaly. No rebound/guarding. No obvious abdominal masses. Abdominal aorta is normal size  without bruit Extremities: Trace edema. no cyanosis, no clubbing, no ulcers  Peripheral : 2+ bilateral upper extremity pulses, 2+ bilateral femoral pulses, 2+ bilateral dorsal pedal pulse Neuro: Alert and oriented. No facial asymmetry. No focal deficit. Moves all extremities spontaneously. Musculoskeletal: Normal muscle tone without kyphosis Psych:  Responds to questions appropriately with a normal affect.    Assessment: 83 year old male with complete heart block status post pacemaker placement essential hypertension makes hyperlipidemia and tobacco use with acute pneumonia hypoxia and non-ST elevation myocardial infarction  Plan: 1. Continue supportive care and antibiotics and inhalers and oxygen for acute pneumonia 2. Discontinuation of hydrochlorothiazide due to concerns of dehydration and issues with chronic kidney disease 3. Metoprolol losartan for hypertension control 4. Echocardiogram for LV systolic dysfunction 5. Antiplatelet medication management with aspirin 6. Further consideration of cardiac catheterization after recovery from above due to elevated troponin myocardial infarction if the still necessary to further investigate and treat. Patient understands risk and benefits cardiac catheterization this includes possibility does stroke heart attack infection bleeding or blood clot. He is at low risk for conscious sedation  Signed, Lamar Blinks M.D. [facility] Cardiology 04/09/2017, 9:47 AM

## 2017-04-09 NOTE — Progress Notes (Signed)
Wean to room air. Paced. Takes meds ok. Up to chair with PT and tolerated it well. BM today. Urinal. Pt has not reported any pain. Pt has no further concerns at this time.

## 2017-04-09 NOTE — Progress Notes (Signed)
PHARMACIST - PHYSICIAN COMMUNICATION DR:   Sudini CONCERNING: Antibiotic IV to Oral Route Change Policy  RECOMMENDATION: This patient is receiving levofloxacin by the intravenous route.  Based on criteria approved by the Pharmacy and Therapeutics Committee, the antibiotic(s) is/are being converted to the equivalent oral dose form(s).   DESCRIPTION: These criteria include:  Patient being treated for a respiratory tract infection, urinary tract infection, cellulitis or clostridium difficile associated diarrhea if on metronidazole  The patient is not neutropenic and does not exhibit a GI malabsorption state  The patient is eating (either orally or via tube) and/or has been taking other orally administered medications for a least 24 hours  The patient is improving clinically and has a Tmax < 100.5  If you have questions about this conversion, please contact the Pharmacy Department  []  ( [phone] )  [place] [x]  ( [phone] )  [facility] []  ( [phone]  [place] []  ( [phone]  [facility] []  ( [phone] )  [facility]   

## 2017-04-09 NOTE — Progress Notes (Signed)
PHARMACIST - PHYSICIAN COMMUNICATION  DR:   Elpidio AnisSudini  CONCERNING: IV to Oral Route Change Policy  RECOMMENDATION: This patient is receiving protonix by the intravenous route.  Based on criteria approved by the Pharmacy and Therapeutics Committee, the intravenous medication(s) is/are being converted to the equivalent oral dose form(s).   DESCRIPTION: These criteria include:  The patient is eating (either orally or via tube) and/or has been taking other orally administered medications for a least 24 hours  The patient has no evidence of active gastrointestinal bleeding or impaired GI absorption (gastrectomy, short bowel, patient on TNA or NPO).  If you have questions about this conversion, please contact the Pharmacy Department  []   [phone]( [phone] )  [facility] [x]   [phone]( [phone] )  [facility] []   [phone]( [phone] )  [facility] []   [phone]( [phone] )  [facility] []   [phone]( [phone] )  [facility]   Olene FlossMelissa D Hye Trawick, [facility] CenterRPH 04/09/2017 1:39 PM

## 2017-04-09 NOTE — Evaluation (Signed)
Physical Therapy Evaluation Patient Details Name: Ronald Weber MRN: [id number] DOB: November 29, 1928 Today's Date: 04/09/2017   History of Present Illness  Pt admitted for ARF. Pt with complaints of weakness with SOB symptoms and fall at home. History includes anxiety, depression, gout, and heart block s/p pacer. Pt now positive for pneumonia and NSTEMI.  Clinical Impression  Pt is a pleasant 83 year old male who was admitted for ARF. Pt performs bed mobility, transfers, and ambulation with min assist and RW. Pt demonstrates deficits with strength/mobility/endurance/balance. Pt fatigues quickly with mobility and is high risk for falls due to weakness. Pt is currently not at baseline level or able to live alone. All mobility performed on room air with sats WNL, RN aware. Would benefit from skilled PT to address above deficits and promote optimal return to PLOF; recommend transition to STR upon discharge from acute hospitalization.       Follow Up Recommendations SNF    Equipment Recommendations  None recommended by PT    Recommendations for Other Services       Precautions / Restrictions Precautions Precautions: Fall Restrictions Weight Bearing Restrictions: No      Mobility  Bed Mobility Overal bed mobility: Needs Assistance Bed Mobility: Supine to Sit     Supine to sit: Min assist     General bed mobility comments: needs assist for trunkal elevation. Once seated at EOB, needs B hands to maintain balance  Transfers Overall transfer level: Needs assistance Equipment used: Rolling walker (2 wheeled) Transfers: Sit to/from Stand Sit to Stand: Min assist         General transfer comment: Decreased strength for push off, needs B hand support. Once standing, upright posture, no buckling noted  Ambulation/Gait Ambulation/Gait assistance: Min assist Ambulation Distance (Feet): 3 Feet Assistive device: Rolling walker (2 wheeled) Gait Pattern/deviations: Step-to pattern    Gait velocity interpretation: Below normal speed for age/gender General Gait Details: unsteady during ambulation to recliner and slight SOB symptoms noted. ALl mobility performed on room air with sats maintaing >90%. Pt visablly fatigued with limited distance, requests to defer further ambulation at this time  Stairs            Wheelchair Mobility    Modified Rankin (Stroke Patients Only)       Balance Overall balance assessment: Needs assistance;History of Falls Sitting-balance support: Feet supported Sitting balance-Leahy Scale: Good     Standing balance support: Bilateral upper extremity supported Standing balance-Leahy Scale: Fair                               Pertinent Vitals/Pain Pain Assessment: No/denies pain    Home Living Family/patient expects to be discharged to:: Private residence Living Arrangements: Alone Available Help at Discharge: Family Type of Home: House Home Access: Level entry     Home Layout: One level Home Equipment: Environmental consultant - 2 wheels      Prior Function Level of Independence: Independent         Comments: would occasionally use RW if needed     Hand Dominance        Extremity/Trunk Assessment   Upper Extremity Assessment Upper Extremity Assessment: Overall WFL for tasks assessed    Lower Extremity Assessment Lower Extremity Assessment: Generalized weakness(B LE grossly 4/5)       Communication   Communication: No difficulties  Cognition Arousal/Alertness: Awake/alert Behavior During Therapy: WFL for tasks assessed/performed Overall Cognitive Status: Within Functional Limits  for tasks assessed                                        General Comments      Exercises Other Exercises Other Exercises: supine ther-ex performed x 10 reps including quad sets, SLRs, and ankle pumps. ALl ther-ex performed x B LE x 10 reps with cga and cues for correct technique. Pt tends to hold breath    Assessment/Plan    PT Assessment Patient needs continued PT services  PT Problem List Decreased strength;Decreased activity tolerance;Decreased balance;Decreased mobility       PT Treatment Interventions Gait training;DME instruction;Therapeutic exercise    PT Goals (Current goals can be found in the Care Plan section)  Acute Rehab PT Goals Patient Stated Goal: to go to rehab PT Goal Formulation: With patient Time For Goal Achievement: 04/23/17 Potential to Achieve Goals: Good    Frequency Min 2X/week   Barriers to discharge        Co-evaluation               AM-PAC PT "6 Clicks" Daily Activity  Outcome Measure Difficulty turning over in bed (including adjusting bedclothes, sheets and blankets)?: Unable Difficulty moving from lying on back to sitting on the side of the bed? : Unable Difficulty sitting down on and standing up from a chair with arms (e.g., wheelchair, bedside commode, etc,.)?: Unable Help needed moving to and from a bed to chair (including a wheelchair)?: A Little Help needed walking in hospital room?: A Lot Help needed climbing 3-5 steps with a railing? : A Lot 6 Click Score: 10    End of Session Equipment Utilized During Treatment: Gait belt Activity Tolerance: Patient limited by fatigue Patient left: in chair(no chair alarm present, RN and aide notified) Nurse Communication: Mobility status PT Visit Diagnosis: Unsteadiness on feet (R26.81);Muscle weakness (generalized) (M62.81);History of falling (Z91.81);Difficulty in walking, not elsewhere classified (R26.2)    Time: 8119-14781056-1115 PT Time Calculation (min) (ACUTE ONLY): 19 min   Charges:   PT Evaluation $PT Eval Low Complexity: 1 Low PT Treatments $Therapeutic Exercise: 8-22 mins   PT G Codes:   PT G-Codes **NOT FOR INPATIENT CLASS** Functional Assessment Tool Used: AM-PAC 6 Clicks Basic Mobility Functional Limitation: Mobility: Walking and moving around Mobility: Walking and Moving  Around Current Status (G9562(G8978): At least 60 percent but less than 80 percent impaired, limited or restricted Mobility: Walking and Moving Around Goal Status [phone](G8979): At least 40 percent but less than 60 percent impaired, limited or restricted    Elizabeth PalauStephanie Mishika Flippen, PT, DPT [phone]   Roquel Burgin 04/09/2017, 12:24 PM

## 2017-04-09 NOTE — Progress Notes (Signed)
[employer] - [place] [facility]   PATIENT NAME: Ronald Weber    MR#:  [id number]  DATE OF BIRTH:  [birth year]  SUBJECTIVE:  CHIEF COMPLAINT:   Chief Complaint  Patient presents with  . Fall  . Cough  . Weakness   Feels weak.  Continues to wheeze and have shortness of breath.  Cough.  No chest pain. Felt weak and fell.  REVIEW OF SYSTEMS:    Review of Systems  Constitutional: Positive for malaise/fatigue. Negative for chills and fever.  HENT: Negative for sore throat.   Eyes: Negative for blurred vision, double vision and pain.  Respiratory: Positive for cough, shortness of breath and wheezing. Negative for hemoptysis.   Cardiovascular: Negative for chest pain, palpitations, orthopnea and leg swelling.  Gastrointestinal: Negative for abdominal pain, constipation, diarrhea, heartburn, nausea and vomiting.  Genitourinary: Negative for dysuria and hematuria.  Musculoskeletal: Negative for back pain and joint pain.  Skin: Negative for rash.  Neurological: Positive for weakness. Negative for sensory change, speech change, focal weakness and headaches.  Endo/Heme/Allergies: Does not bruise/bleed easily.  Psychiatric/Behavioral: Negative for depression. The patient is not nervous/anxious.     DRUG ALLERGIES:   Allergies  Allergen Reactions  . Colchicine   . Doxycycline Other (See Comments)  . Meloxicam Other (See Comments)  . Sulfa Antibiotics     Other reaction(s): UNKNOWN  . Tussin  [Guaifenesin] Other (See Comments)  . Amoxicillin-Pot Clavulanate Rash    VITALS:  Blood pressure (!) 154/78, pulse 66, temperature 98.1 F (36.7 C), temperature source Oral, resp. rate 18, height 5\' 10"  (1.778 m), weight 80.2 kg (176 lb 11.2 oz), SpO2 98 %.  PHYSICAL EXAMINATION:   Physical Exam  GENERAL:  83 y.o.-year-old patient lying in the bed with no acute distress.  EYES: Pupils equal, round, reactive to light and accommodation. No scleral icterus. Extraocular  muscles intact.  HEENT: Head atraumatic, normocephalic. Oropharynx and nasopharynx clear.  NECK:  Supple, no jugular venous distention. No thyroid enlargement, no tenderness.  LUNGS: Bilateral wheezing with some conversational dyspnea CARDIOVASCULAR: S1, S2 normal. No murmurs, rubs, or gallops.  ABDOMEN: Soft, nontender, nondistended. Bowel sounds present. No organomegaly or mass.  EXTREMITIES: No cyanosis, clubbing or edema b/l.    NEUROLOGIC: Cranial nerves II through XII are intact. No focal Motor or sensory deficits b/l.   PSYCHIATRIC: The patient is alert and oriented x 3.  SKIN: No obvious rash, lesion, or ulcer.  Bruising in multiple sites  LABORATORY PANEL:   CBC Recent Labs  Lab 04/09/17 0120  WBC 9.7  HGB 12.1*  HCT 36.5*  PLT 168   ------------------------------------------------------------------------------------------------------------------ Chemistries  Recent Labs  Lab 04/09/17 0120  NA 136  K 4.7  CL 105  CO2 22  GLUCOSE 100*  BUN 34*  CREATININE 1.73*  CALCIUM 8.2*  AST 178*  ALT 56  ALKPHOS 44  BILITOT 1.0   ------------------------------------------------------------------------------------------------------------------  Cardiac Enzymes Recent Labs  Lab 04/09/17 0153  TROPONINI 0.60*   ------------------------------------------------------------------------------------------------------------------  RADIOLOGY:  Dg Chest 2 View  Result Date: 04/08/2017 CLINICAL DATA:  Pain after fall. EXAM: CHEST  2 VIEW COMPARISON:  July 31, 2016 FINDINGS: Left basilar infiltrate is identified, best seen on the lateral view. The heart, hila, mediastinum, lungs, and pleura are otherwise unremarkable. A 2 lead pacemaker is stable. IMPRESSION: Left basilar infiltrate concerning for pneumonia or aspiration, best seen on the lateral view. Recommend follow-up to resolution. Electronically Signed   By: Gerome Sam III M.D  On: 04/08/2017 10:05      ASSESSMENT AND PLAN:   * Left lower lobe pneumonia with acute hypoxic respiratory failure IV Levaquin.  IV steroids.  Nebulizers. Cultures pending. Wean oxygen as tolerated  *Acute rhabdomyolysis.  CK improving from [phone]. Continue IV fluids  *Acute kidney injury over CKD stage III due to rhabdomyolysis.  Improving.  Monitor input and output.  Repeat labs in the morning.  *Elevated troponin likely due to rhabdomyolysis and demand ischemia. Unclear if this is MI.  Will wait for echocardiogram. Appreciate cardiology input. Start aspirin.  *DVT prophylaxis with heparin  All the records are reviewed and case discussed with Care Management/Social Workerr. Management plans discussed with the patient, family and they are in agreement.  CODE STATUS: FULL CODE  TOTAL TIME TAKING CARE OF THIS PATIENT: 35 minutes.   POSSIBLE D/C IN 2-3 DAYS, DEPENDING ON CLINICAL CONDITION.  Milagros LollSudini, Colum Colt R M.D on 04/09/2017 at 1:19 PM  Between 7am to 6pm - Pager - [phone]  After 6pm go to [email or web address] - password [email or web address] [email or web address]  [facility]  Office  [phone]  CC: Primary care physician; Lauro RegulusAnderson, Marshall W, MD  Note: This dictation was prepared with Dragon dictation along with smaller phrase technology. Any transcriptional errors that result from this process are unintentional.

## 2017-04-10 ENCOUNTER — Inpatient Hospital Stay: Payer: Medicare Other

## 2017-04-10 LAB — BASIC METABOLIC PANEL
Anion gap: 6 (ref 5–15)
BUN: 37 mg/dL — AB (ref 6–20)
CALCIUM: 8.4 mg/dL — AB (ref 8.9–10.3)
CO2: 24 mmol/L (ref 22–32)
CREATININE: 1.56 mg/dL — AB (ref 0.61–1.24)
Chloride: 106 mmol/L (ref 101–111)
GFR calc Af Amer: 45 mL/min — ABNORMAL LOW (ref >60)
GFR, EST NON AFRICAN AMERICAN: 39 mL/min — AB (ref >60)
GLUCOSE: 132 mg/dL — AB (ref 65–99)
Potassium: 4.7 mmol/L (ref 3.5–5.1)
Sodium: 136 mmol/L (ref 135–145)

## 2017-04-10 LAB — CK: Total CK: 2424 U/L — ABNORMAL HIGH (ref 49–397)

## 2017-04-10 MED ORDER — TAMSULOSIN HCL 0.4 MG PO CAPS
0.4000 mg | ORAL_CAPSULE | Freq: Every day | ORAL | Status: DC
  Administered 2017-04-10 – 2017-04-11 (×2): 0.4 mg via ORAL
  Filled 2017-04-10 (×3): qty 1

## 2017-04-10 MED ORDER — PREDNISONE 50 MG PO TABS
50.0000 mg | ORAL_TABLET | Freq: Every day | ORAL | Status: DC
  Administered 2017-04-11 – 2017-04-12 (×2): 50 mg via ORAL
  Filled 2017-04-10 (×2): qty 1

## 2017-04-10 MED ORDER — GUAIFENESIN-DM 100-10 MG/5ML PO SYRP
5.0000 mL | ORAL_SOLUTION | ORAL | Status: DC | PRN
  Administered 2017-04-10 – 2017-04-12 (×4): 5 mL via ORAL
  Filled 2017-04-10 (×4): qty 5

## 2017-04-10 NOTE — Care Management (Signed)
CM consult for home health needs.  Patient lives alone and was found down on floor by family members.  Says he fell during the night when getting up for a glass of water. Being treated for pneumonia and and rhabdo.  Physical therapy is recommending skilled nursing placement.  CSW is involved

## 2017-04-10 NOTE — Plan of Care (Signed)
No changes noted at this time , care ongoing will continue to monitor

## 2017-04-10 NOTE — Progress Notes (Signed)
[facility] - [place] [facility]   PATIENT NAME: Ronald Weber    MR#:  [id number]  DATE OF BIRTH:  1928-08-19  SUBJECTIVE:  CHIEF COMPLAINT:   Chief Complaint  Patient presents with  . Fall  . Cough  . Weakness   Still feels weak. No walking. No orthopnea. Yellow sputum.  REVIEW OF SYSTEMS:    Review of Systems  Constitutional: Positive for malaise/fatigue. Negative for chills and fever.  HENT: Negative for sore throat.   Eyes: Negative for blurred vision, double vision and pain.  Respiratory: Positive for cough, shortness of breath and wheezing. Negative for hemoptysis.   Cardiovascular: Negative for chest pain, palpitations, orthopnea and leg swelling.  Gastrointestinal: Negative for abdominal pain, constipation, diarrhea, heartburn, nausea and vomiting.  Genitourinary: Negative for dysuria and hematuria.  Musculoskeletal: Negative for back pain and joint pain.  Skin: Negative for rash.  Neurological: Positive for weakness. Negative for sensory change, speech change, focal weakness and headaches.  Endo/Heme/Allergies: Does not bruise/bleed easily.  Psychiatric/Behavioral: Negative for depression. The patient is not nervous/anxious.     DRUG ALLERGIES:   Allergies  Allergen Reactions  . Colchicine   . Doxycycline Other (See Comments)  . Meloxicam Other (See Comments)  . Sulfa Antibiotics     Other reaction(s): UNKNOWN  . Tussin  [Guaifenesin] Other (See Comments)  . Amoxicillin-Pot Clavulanate Rash    VITALS:  Blood pressure (!) 167/88, pulse 60, temperature 97.6 F (36.4 C), temperature source Oral, resp. rate 18, height 5\' 10"  (1.778 m), weight 76.7 kg (169 lb 1.6 oz), SpO2 93 %.  PHYSICAL EXAMINATION:   Physical Exam  GENERAL:  83 y.o.-year-old patient lying in the bed audible wheezing and coarse breath sounds EYES: Extraocular muscles intact.  HEENT: Head atraumatic, normocephalic. Oropharynx and nasopharynx clear.  NECK:  Supple, no  jugular venous distention. No thyroid enlargeme  LUNGS: Bilateral wheezing with corse sounds CARDIOVASCULAR: S1, S2 normal. No murmurs, rubs, or gallops.  ABDOMEN: Soft, nontender, nondistended.  EXTREMITIES: No cyanosis, clubbing or edema b/l.    NEUROLOGIC: Moves all 4 extremities  PSYCHIATRIC: The patient is alert and oriented x 3.  SKIN: No obvious rash, lesion, or ulcer.  Bruising in multiple sites  LABORATORY PANEL:   CBC Recent Labs  Lab 04/09/17 0120  WBC 9.7  HGB 12.1*  HCT 36.5*  PLT 168   ------------------------------------------------------------------------------------------------------------------ Chemistries  Recent Labs  Lab 04/09/17 0120 04/10/17 0435  NA 136 136  K 4.7 4.7  CL 105 106  CO2 22 24  GLUCOSE 100* 132*  BUN 34* 37*  CREATININE 1.73* 1.56*  CALCIUM 8.2* 8.4*  AST 178*  --   ALT 56  --   ALKPHOS 44  --   BILITOT 1.0  --    ------------------------------------------------------------------------------------------------------------------  Cardiac Enzymes Recent Labs  Lab 04/09/17 0153  TROPONINI 0.60*   ------------------------------------------------------------------------------------------------------------------  RADIOLOGY:  Dg Chest 2 View  Result Date: 04/08/2017 CLINICAL DATA:  Pain after fall. EXAM: CHEST  2 VIEW COMPARISON:  July 31, 2016 FINDINGS: Left basilar infiltrate is identified, best seen on the lateral view. The heart, hila, mediastinum, lungs, and pleura are otherwise unremarkable. A 2 lead pacemaker is stable. IMPRESSION: Left basilar infiltrate concerning for pneumonia or aspiration, best seen on the lateral view. Recommend follow-up to resolution. Electronically Signed   By: Gerome Samavid  Williams III M.D   On: 04/08/2017 10:05     ASSESSMENT AND PLAN:   * Left lower lobe pneumonia with acute hypoxic respiratory  failure  Levaquin.  IV steroids.  Nebulizers. Cultures pending. Off oxygen  Will repeat CXR for  possible pulm edema.   *Acute rhabdomyolysis.  CK improving from [id number]-->2500 IVF stopped.  *Acute kidney injury over CKD stage III due to rhabdomyolysis.  Resolved. Baseline 1.5  * Elevated troponin likely due to rhabdomyolysis and demand ischemia. Unclear if this is MI.  Echo is unchanged EF 45% with diffuse hypokinesis ON ASA, metoprolol. No statin due to rhabdomyolysis. Discussed with Dr. Gwen Pounds. Will treat as medically for possible NSTEMI with meds. OP w/u.  *DVT prophylaxis with heparin  CODE STATUS: FULL CODE  TOTAL TIME TAKING CARE OF THIS PATIENT: 25 minutes.   POSSIBLE D/C IN 2-3 DAYS, DEPENDING ON CLINICAL CONDITION. Likely SNF  Milagros Loll R M.D on 04/10/2017 at 8:58 AM  Between 7am to 6pm - Pager - [phone]  After 6pm go to [email or web address] - password [email or web address] [facility]  [facility]  Office  [phone]  CC: Primary care physician; Lauro Regulus, MD  Note: This dictation was prepared with Dragon dictation along with smaller phrase technology. Any transcriptional errors that result from this process are unintentional.

## 2017-04-10 NOTE — Progress Notes (Signed)
[facility]  Patient: Ronald Weber / Admit Date: 04/08/2017 / Date of Encounter: 04/10/2017, 7:50 AM   Subjective: Patient is feeling much better today with less shortness of breath cough and congestion although still somewhat weak. No evidence of chest pain throughout his hospital course or prior to this suggesting possible elevated troponin to be demand ischemia and or consistent with rhabdomyolysis. Troponin and CK-MBs trending down with continued chronic kidney disease Echocardiogram with mild global LV systolic dysfunction but overall ejection fraction appears to be low normal at 50%. Dysfunction possibly due to AV pacing rather than cardiovascular disease  Review of Systems: Positive for: Cough congestion Negative for: Vision change, hearing change, syncope, dizziness, nausea, vomiting,diarrhea, bloody stool, stomach pain, positive for cough, congestion, negative for diaphoresis, urinary frequency, urinary pain,skin lesions, skin rashes Others previously listed  Objective: Telemetry: AV pacing Physical Exam: Blood pressure (!) 167/88, pulse 60, temperature 97.6 F (36.4 C), temperature source Oral, resp. rate 18, height 5\' 10"  (1.778 m), weight 76.7 kg (169 lb 1.6 oz), SpO2 93 %. Body mass index is 24.26 kg/m. General: Well developed, well nourished, in no acute distress. Head: Normocephalic, atraumatic, sclera non-icteric, no xanthomas, nares are without discharge. Neck: No apparent masses Lungs: Normal respirations with no wheezes, diffuse rhonchi, no rales , few crackles   Heart: Regular rate and rhythm, normal S1 S2, no murmur, no rub, no gallop, PMI is normal size and placement, carotid upstroke normal without bruit, jugular venous pressure normal Abdomen: Soft, non-tender, non-distended with normoactive bowel sounds. No hepatosplenomegaly. Abdominal aorta is normal size without bruit Extremities: Trace edema, no clubbing, no cyanosis, no  ulcers,  Peripheral: 2+ radial, 2+ femoral, 2+ dorsal pedal pulses Neuro: Alert and oriented. Moves all extremities spontaneously. Psych:  Responds to questions appropriately with a normal affect.   Intake/Output Summary (Last 24 hours) at 04/10/2017 0750 Last data filed at 04/10/2017 0405 Gross per 24 hour  Intake 220 ml  Output 550 ml  Net -330 ml    Inpatient Medications:  . aspirin EC  81 mg Oral Daily  . docusate sodium  100 mg Oral BID  . heparin  5,000 Units Subcutaneous Q8H  . hydrOXYzine  25 mg Oral QHS  . Influenza vac split quadrivalent PF  0.5 mL Intramuscular Tomorrow-1000  . ipratropium-albuterol  3 mL Nebulization TID  . latanoprost  1 drop Both Eyes QHS  . levofloxacin  750 mg Oral Q48H  . losartan  100 mg Oral Daily  . methylPREDNISolone (SOLU-MEDROL) injection  60 mg Intravenous Q12H  . metoprolol tartrate  25 mg Oral BID  . pantoprazole  40 mg Oral BID  . sertraline  25 mg Oral Daily  . sodium chloride flush  3 mL Intravenous Q12H  . zolpidem  5 mg Oral QHS   Infusions:   Labs: Recent Labs    04/09/17 0120 04/10/17 0435  NA 136 136  K 4.7 4.7  CL 105 106  CO2 22 24  GLUCOSE 100* 132*  BUN 34* 37*  CREATININE 1.73* 1.56*  CALCIUM 8.2* 8.4*   Recent Labs    04/09/17 0120  AST 178*  ALT 56  ALKPHOS 44  BILITOT 1.0  PROT 5.6*  ALBUMIN 2.8*   Recent Labs    04/08/17 0923 04/09/17 0120  WBC 13.9* 9.7  HGB 13.2 12.1*  HCT 41.0 36.5*  MCV 90.4 89.9  PLT 231 168   Recent Labs    04/08/17 0934 04/08/17 1410 04/08/17  1934 04/09/17 0120 04/09/17 0153 04/10/17 0435  CKTOTAL 11,264*  --   --  6,702*  --  2,424*  TROPONINI 1.80* 1.39* 0.91*  --  0.60*  --    Invalid input(s): POCBNP No results for input(s): HGBA1C in the last 72 hours.   Weights: Filed Weights   04/08/17 0920 04/09/17 0351 04/10/17 0405  Weight: 83 kg (183 lb) 80.2 kg (176 lb 11.2 oz) 76.7 kg (169 lb 1.6 oz)     Radiology/Studies:  Dg Chest 2 View  Result  Date: 04/08/2017 CLINICAL DATA:  Pain after fall. EXAM: CHEST  2 VIEW COMPARISON:  July 31, 2016 FINDINGS: Left basilar infiltrate is identified, best seen on the lateral view. The heart, hila, mediastinum, lungs, and pleura are otherwise unremarkable. A 2 lead pacemaker is stable. IMPRESSION: Left basilar infiltrate concerning for pneumonia or aspiration, best seen on the lateral view. Recommend follow-up to resolution. Electronically Signed   By: Gerome Sam III M.D   On: 04/08/2017 10:05     Assessment and Recommendation  83 y.o. male with the known complete heart block Status post AV pacing and essential hypertension mixed hyperlipidemia previous on appropriate medication management who is had a fall and rhabdomyolysis with an elevated troponin concerning for non-ST elevation myocardial infarction although is unclear due to no evidence of chest discomfort or chest pressure prior to or during this event. 1. Continue medication management and hydration for rhabdomyolysis from fall 2. No further cardiac diagnostics necessary at this time due to no evidence of further chest discomfort or cardiovascular symptoms 3. Continue aspirin for further risk reduction cardiovascular event 4. Metoprolol angiotensin receptor blocker for cardiovascular disease prevention and hypertension control 5. Begin ambulation after recovery from rhabdomyolysis as well as pneumonia and follow for any further significant cardiovascular symptoms that may be an needing further investigation 6. If ambulating well without any further significant symptoms okay for discharge to home with close follow-up and possible outpatient stress test thereafter  Signed, Arnoldo Hooker M.D. FACC

## 2017-04-10 NOTE — NC FL2 (Signed)
[place] MEDICAID FL2 LEVEL OF CARE SCREENING TOOL     IDENTIFICATION  Patient Name: Ronald Weber Birthdate: 08-Aug-1928 Sex: male Admission Date (Current Location): 04/08/2017  [facility] [place]   Facility and Address:  [facility], [address], [place], [place] [address]      Provider Number: [id number]  Attending Physician Name and Address:  Milagros LollSudini, Srikar, MD  Relative Name and Phone Number:  Anette Guarneriance,Greg Son [phone]  [phone]     Current Level of Care: Hospital Recommended Level of Care: Skilled Nursing Facility Prior Approval Number:    Date Approved/Denied:   PASRR Number: Pending  Discharge Plan: SNF    Current Diagnoses: Patient Active Problem List   Diagnosis Date Noted  . CAP (community acquired pneumonia) 04/08/2017  . Acute respiratory failure (HCC) 04/08/2017  . Rhabdomyolysis 04/08/2017  . Elevated troponin 04/08/2017  . Incontinence 08/20/2015  . BPH (benign prostatic hyperplasia) 08/20/2015  . Moderate episode of recurrent major depressive disorder (HCC) 07/15/2015  . Peripheral vascular disease (HCC) 03/31/2015  . Derangement of knee 10/03/2014  . Acute gout 08/06/2014  . Acute gouty arthritis 08/06/2014  . B12 deficiency 04/07/2014  . Absence of sensation 04/07/2014  . Disordered sleep 04/07/2014  . Fothergill's neuralgia 04/07/2014  . DDD (degenerative disc disease), lumbar 10/11/2013  . Neuritis or radiculitis due to rupture of lumbar intervertebral disc 10/11/2013  . Degenerative arthritis of lumbar spine 10/11/2013  . CAFL (chronic airflow limitation) (HCC) 09/12/2013  . BP (high blood pressure) 09/12/2013  . Chronic obstructive pulmonary disease (HCC) 09/12/2013  . Artificial cardiac pacemaker 01/30/2013  . Benign prostatic hyperplasia with urinary obstruction 04/06/2012  . Benign fibroma of prostate 04/06/2012  . Abnormal prostate specific antigen 04/06/2012  . FOM (frequency of  micturition) 04/06/2012  . Elevated prostate specific antigen (PSA) 04/06/2012    Orientation RESPIRATION BLADDER Height & Weight     Self, Time, Situation, Place  Normal Continent Weight: 169 lb 1.6 oz (76.7 kg) Height:  5\' 10"  (177.8 cm)  BEHAVIORAL SYMPTOMS/MOOD NEUROLOGICAL BOWEL NUTRITION STATUS      Continent Diet  AMBULATORY STATUS COMMUNICATION OF NEEDS Skin   Limited Assist Verbally Normal                       Personal Care Assistance Level of Assistance  Bathing, Feeding, Dressing Bathing Assistance: Limited assistance Feeding assistance: Independent Dressing Assistance: Limited assistance     Functional Limitations Info  Sight, Hearing, Speech Sight Info: Adequate Hearing Info: Adequate Speech Info: Adequate    SPECIAL CARE FACTORS FREQUENCY  PT (By licensed PT)     PT Frequency: 5x a week              Contractures Contractures Info: Not present    Additional Factors Info  Code Status, Allergies, Psychotropic Code Status Info: Full Code Allergies Info: COLCHICINE, DOXYCYCLINE, MELOXICAM, SULFA ANTIBIOTICS, Tussin  GUAIFENESIN, AMOXICILLIN-POT CLAVULANATE  Psychotropic Info: sertraline (ZOLOFT) tablet 25 mg and zolpidem (AMBIEN) tablet 5 mg          Current Medications (04/10/2017):  This is the current hospital active medication list Current Facility-Administered Medications  Medication Dose Route Frequency Provider Last Rate Last Dose  . acetaminophen (TYLENOL) tablet 650 mg  650 mg Oral Q6H PRN Marguarite ArbourSparks, Jeffrey D, MD   650 mg at 04/10/17 1407   Or  . acetaminophen (TYLENOL) suppository 650 mg  650 mg Rectal Q6H PRN Marguarite ArbourSparks, Jeffrey D, MD      .  aspirin EC tablet 81 mg  81 mg Oral Daily Milagros Loll, MD   81 mg at 04/10/17 1610  . bisacodyl (DULCOLAX) suppository 10 mg  10 mg Rectal Daily PRN Marguarite Arbour, MD      . docusate sodium (COLACE) capsule 100 mg  100 mg Oral BID Marguarite Arbour, MD   100 mg at 04/10/17 9604  .  guaiFENesin-dextromethorphan (ROBITUSSIN DM) 100-10 MG/5ML syrup 5 mL  5 mL Oral Q4H PRN Sudini, Wardell Heath, MD   5 mL at 04/10/17 1335  . heparin injection 5,000 Units  5,000 Units Subcutaneous Q8H Marguarite Arbour, MD   5,000 Units at 04/10/17 1335  . hydrALAZINE (APRESOLINE) injection 10 mg  10 mg Intravenous Q4H PRN Marguarite Arbour, MD      . hydrOXYzine (ATARAX/VISTARIL) tablet 25 mg  25 mg Oral QHS Marguarite Arbour, MD   25 mg at 04/09/17 2158  . Influenza vac split quadrivalent PF (FLUZONE HIGH-DOSE) injection 0.5 mL  0.5 mL Intramuscular Tomorrow-1000 Aram Beecham D, MD      . ipratropium-albuterol (DUONEB) 0.5-2.5 (3) MG/3ML nebulizer solution 3 mL  3 mL Nebulization TID Marguarite Arbour, MD   3 mL at 04/10/17 1330  . ipratropium-albuterol (DUONEB) 0.5-2.5 (3) MG/3ML nebulizer solution 3 mL  3 mL Nebulization Q4H PRN Marguarite Arbour, MD      . latanoprost (XALATAN) 0.005 % ophthalmic solution 1 drop  1 drop Both Eyes QHS Marguarite Arbour, MD   1 drop at 04/09/17 2201  . levofloxacin (LEVAQUIN) tablet 750 mg  750 mg Oral Q48H Maccia, Melissa D, RPH   750 mg at 04/10/17 0952  . losartan (COZAAR) tablet 100 mg  100 mg Oral Daily Marguarite Arbour, MD   100 mg at 04/10/17 5409  . metoprolol tartrate (LOPRESSOR) tablet 25 mg  25 mg Oral BID Marguarite Arbour, MD   25 mg at 04/10/17 8119  . ondansetron (ZOFRAN) tablet 4 mg  4 mg Oral Q6H PRN Marguarite Arbour, MD       Or  . ondansetron [facility]) injection 4 mg  4 mg Intravenous Q6H PRN Marguarite Arbour, MD      . Melene Muller ON 04/11/2017] predniSONE (DELTASONE) tablet 50 mg  50 mg Oral Q breakfast Sudini, Srikar, MD      . sertraline (ZOLOFT) tablet 25 mg  25 mg Oral Daily Marguarite Arbour, MD   25 mg at 04/10/17 1478  . sodium chloride flush (NS) 0.9 % injection 3 mL  3 mL Intravenous Q12H Marguarite Arbour, MD   3 mL at 04/10/17 0953  . tamsulosin (FLOMAX) capsule 0.4 mg  0.4 mg Oral QPC supper Milagros Loll, MD      . zolpidem Remus Loffler)  tablet 5 mg  5 mg Oral Lamont Snowball, MD   5 mg at 04/09/17 2158     Discharge Medications: Please see discharge summary for a list of discharge medications.  Relevant Imaging Results:  Relevant Lab Results:   Additional Information SSN [id number]  Darleene Cleaver, [place]

## 2017-04-11 LAB — BASIC METABOLIC PANEL
Anion gap: 5 (ref 5–15)
BUN: 37 mg/dL — AB (ref 6–20)
CALCIUM: 8.6 mg/dL — AB (ref 8.9–10.3)
CO2: 25 mmol/L (ref 22–32)
CREATININE: 1.51 mg/dL — AB (ref 0.61–1.24)
Chloride: 108 mmol/L (ref 101–111)
GFR calc Af Amer: 46 mL/min — ABNORMAL LOW (ref >60)
GFR, EST NON AFRICAN AMERICAN: 40 mL/min — AB (ref >60)
Glucose, Bld: 110 mg/dL — ABNORMAL HIGH (ref 65–99)
Potassium: 4.2 mmol/L (ref 3.5–5.1)
SODIUM: 138 mmol/L (ref 135–145)

## 2017-04-11 LAB — BLOOD GAS, VENOUS
ACID-BASE DEFICIT: 4.4 mmol/L — AB (ref 0.0–2.0)
Bicarbonate: 22.1 mmol/L (ref 20.0–28.0)
PATIENT TEMPERATURE: 37
PCO2 VEN: 45 mmHg (ref 44.0–60.0)
pH, Ven: 7.3 (ref 7.250–7.430)

## 2017-04-11 LAB — CK: CK TOTAL: 1048 U/L — AB (ref 49–397)

## 2017-04-11 MED ORDER — LOPERAMIDE HCL 2 MG PO CAPS
2.0000 mg | ORAL_CAPSULE | Freq: Four times a day (QID) | ORAL | Status: DC | PRN
  Administered 2017-04-11 (×2): 2 mg via ORAL
  Filled 2017-04-11 (×2): qty 1

## 2017-04-11 MED ORDER — FUROSEMIDE 40 MG PO TABS
40.0000 mg | ORAL_TABLET | Freq: Once | ORAL | Status: AC
  Administered 2017-04-11: 40 mg via ORAL
  Filled 2017-04-11: qty 1

## 2017-04-11 NOTE — Progress Notes (Signed)
Physical Therapy Treatment Patient Details Name: Ronald Weber T Schuur MRN: [id number] DOB: 09/08/1928 Today's Date: 04/11/2017    History of Present Illness Pt admitted for ARF. Pt with complaints of weakness with SOB symptoms and fall at home. History includes anxiety, depression, gout, and heart block s/p pacer. Pt now positive for pneumonia and NSTEMI.    PT Comments    Pt declining OOB mobility d/t reporting being too weak to walk but was agreeable to in bed ex's.  Pt appearing to fatigue with ex's in bed and required assist for some LE ex's d/t LE weakness.  Will continue to progress pt with strengthening and progressive functional mobility per pt tolerance.    Follow Up Recommendations  SNF     Equipment Recommendations  Rolling walker with 5" wheels    Recommendations for Other Services       Precautions / Restrictions Precautions Precautions: Fall Restrictions Weight Bearing Restrictions: No    Mobility  Bed Mobility               General bed mobility comments: Pt declined OOB mobility d/t feeling too weak.  Transfers                 General transfer comment: Pt declined OOB mobility d/t feeling too weak.  Ambulation/Gait             General Gait Details: Pt declined OOB mobility d/t feeling too weak.   Stairs            Wheelchair Mobility    Modified Rankin (Stroke Patients Only)       Balance                                            Cognition Arousal/Alertness: Awake/alert Behavior During Therapy: WFL for tasks assessed/performed Overall Cognitive Status: Within Functional Limits for tasks assessed                                        Exercises Total Joint Exercises Ankle Circles/Pumps: AROM;Strengthening;Both;Supine(10 reps x2) Quad Sets: AROM;Strengthening;Both;Supine(10 reps x2) Gluteal Sets: AROM;Strengthening;Both;Supine(10 reps x2) Short Arc Quad: AROM;Strengthening;Both;Supine(10  reps x2) Heel Slides: AAROM;AROM;Strengthening;Both;Supine(10 reps x2) Hip ABduction/ADduction: AAROM;AROM;Strengthening;Both;Supine(10 reps x2) Straight Leg Raises: AAROM;Strengthening;Both;Supine(10 reps x2)    General Comments   Nursing cleared pt for participation in physical therapy.  Pt agreeable to PT session (ex's in bed).      Pertinent Vitals/Pain Pain Assessment: 0-10 Pain Score: 2  Pain Location: low back and LE pain Pain Descriptors / Indicators: Sore Pain Intervention(s): Limited activity within patient's tolerance;Monitored during session;Premedicated before session;Repositioned  Vitals (HR and O2 on room air) stable and WFL throughout treatment session.    Home Living                      Prior Function            PT Goals (current goals can now be found in the care plan section) Acute Rehab PT Goals Patient Stated Goal: to go to rehab PT Goal Formulation: With patient Time For Goal Achievement: 04/23/17 Potential to Achieve Goals: Good Additional Goals Additional Goal #1: Pt will be able to perform bed mobility/transfers with supervision and LRAD in order to improve functional independence Progress towards PT  goals: Progressing toward goals(with strengthening)    Frequency    Min 2X/week      PT Plan Current plan remains appropriate    Co-evaluation              AM-PAC PT "6 Clicks" Daily Activity  Outcome Measure  Difficulty turning over in bed (including adjusting bedclothes, sheets and blankets)?: Unable Difficulty moving from lying on back to sitting on the side of the bed? : Unable Difficulty sitting down on and standing up from a chair with arms (e.g., wheelchair, bedside commode, etc,.)?: Unable Help needed moving to and from a bed to chair (including a wheelchair)?: A Little Help needed walking in hospital room?: A Lot Help needed climbing 3-5 steps with a railing? : A Lot 6 Click Score: 10    End of Session   Activity  Tolerance: Patient limited by fatigue Patient left: in bed;with call bell/phone within reach;with bed alarm set Nurse Communication: Mobility status PT Visit Diagnosis: Unsteadiness on feet (R26.81);Muscle weakness (generalized) (M62.81);History of falling (Z91.81);Difficulty in walking, not elsewhere classified (R26.2)     Time: [id number] PT Time Calculation (min) (ACUTE ONLY): 17 min  Charges:  $Therapeutic Exercise: 8-22 mins                    G CodesHendricks Limes:       Kodi Guerrera, PT 04/11/17, 1:06 PM [phone]

## 2017-04-11 NOTE — Progress Notes (Signed)
[facility] - [place] at [facility]   PATIENT NAME: Ronald Weber    MR#:  [id number]  DATE OF BIRTH:  22-Mar-1929  SUBJECTIVE:  CHIEF COMPLAINT:   Chief Complaint  Patient presents with  . Fall  . Cough  . Weakness   Still has wheezing and SOB. On RA  REVIEW OF SYSTEMS:    Review of Systems  Constitutional: Positive for malaise/fatigue. Negative for chills and fever.  HENT: Negative for sore throat.   Eyes: Negative for blurred vision, double vision and pain.  Respiratory: Positive for cough, shortness of breath and wheezing. Negative for hemoptysis.   Cardiovascular: Negative for chest pain, palpitations, orthopnea and leg swelling.  Gastrointestinal: Negative for abdominal pain, constipation, diarrhea, heartburn, nausea and vomiting.  Genitourinary: Negative for dysuria and hematuria.  Musculoskeletal: Negative for back pain and joint pain.  Skin: Negative for rash.  Neurological: Positive for weakness. Negative for sensory change, speech change, focal weakness and headaches.  Endo/Heme/Allergies: Does not bruise/bleed easily.  Psychiatric/Behavioral: Negative for depression. The patient is not nervous/anxious.     DRUG ALLERGIES:   Allergies  Allergen Reactions  . Colchicine   . Doxycycline Other (See Comments)  . Meloxicam Other (See Comments)  . Sulfa Antibiotics     Other reaction(s): UNKNOWN  . Tussin  [Guaifenesin] Other (See Comments)  . Amoxicillin-Pot Clavulanate Rash    VITALS:  Blood pressure (!) 148/85, pulse 88, temperature 98.7 F (37.1 C), temperature source Oral, resp. rate 18, height 5\' 10"  (1.778 m), weight 77.7 kg (171 lb 6.4 oz), SpO2 93 %.  PHYSICAL EXAMINATION:   Physical Exam  GENERAL:  83 y.o.-year-old patient lying in the bed audible wheezing and coarse breath sounds EYES: Extraocular muscles intact.  HEENT: Head atraumatic, normocephalic. Oropharynx and nasopharynx clear.  NECK:  Supple, no jugular venous distention.  No thyroid enlargeme  LUNGS: Bilateral wheezing with coarse sounds CARDIOVASCULAR: S1, S2 normal. No murmurs, rubs, or gallops.  ABDOMEN: Soft, nontender, nondistended.  EXTREMITIES: No cyanosis, clubbing or edema b/l.    NEUROLOGIC: Moves all 4 extremities  PSYCHIATRIC: The patient is alert and oriented x 3.  SKIN: No obvious rash, lesion, or ulcer.  Bruising in multiple sites  LABORATORY PANEL:   CBC Recent Labs  Lab 04/09/17 0120  WBC 9.7  HGB 12.1*  HCT 36.5*  PLT 168   ------------------------------------------------------------------------------------------------------------------ Chemistries  Recent Labs  Lab 04/09/17 0120  04/11/17 0509  NA 136   < > 138  K 4.7   < > 4.2  CL 105   < > 108  CO2 22   < > 25  GLUCOSE 100*   < > 110*  BUN 34*   < > 37*  CREATININE 1.73*   < > 1.51*  CALCIUM 8.2*   < > 8.6*  AST 178*  --   --   ALT 56  --   --   ALKPHOS 44  --   --   BILITOT 1.0  --   --    < > = values in this interval not displayed.   ------------------------------------------------------------------------------------------------------------------  Cardiac Enzymes Recent Labs  Lab 04/09/17 0153  TROPONINI 0.60*   ------------------------------------------------------------------------------------------------------------------  RADIOLOGY:  Dg Chest 2 View  Result Date: 04/10/2017 CLINICAL DATA:  Cough EXAM: CHEST  2 VIEW COMPARISON:  April 08, 2017 FINDINGS: There is persistent airspace consolidation in the posterior left base, similar to recent study. There is mild scarring in the right perihilar region. Lungs elsewhere clear. Heart  size and pulmonary vascularity are normal. Pacemaker leads are attached to the right atrium and right ventricle. No adenopathy. There is degenerative change in the thoracic spine. There is aortic atherosclerosis. IMPRESSION: Persistent and stable airspace consolidation posterior left base. Stable scarring right perihilar region.  No new opacity evident. Stable cardiac silhouette. There is aortic atherosclerosis. No evident adenopathy. Aortic Atherosclerosis (ICD10-I70.0). Followup PA and lateral chest radiographs recommended in 3-4 weeks following trial of antibiotic therapy to ensure resolution and exclude underlying malignancy. Electronically Signed   By: Bretta BangWilliam  Woodruff III M.D.   On: 04/10/2017 09:54     ASSESSMENT AND PLAN:   * Left lower lobe pneumonia with acute hypoxic respiratory failure  Levaquin.  IV steroids.  Nebulizers. Cultures pending. Off oxygen Repeat CXR shows no worsening.  *Acute rhabdomyolysis.  CK improving from [phone]-->2500-->1000 IVF stopped.  *Acute kidney injury over CKD stage III due to rhabdomyolysis.  Resolved. Baseline 1.5  * Elevated troponin likely due to rhabdomyolysis and demand ischemia. Echo is unchanged EF 45% with diffuse hypokinesis ON ASA, metoprolol. No statin due to rhabdomyolysis. Discussed with Dr. Gwen PoundsKowalski. Will treat  medically for possible NSTEMI with meds. OP w/u. Cardiac rehab referral  *DVT prophylaxis with heparin  CODE STATUS: FULL CODE  TOTAL TIME TAKING CARE OF THIS PATIENT: 30 minutes.   SNF in 1 -2 days  Milagros LollSudini, [name] R M.D on 04/11/2017 at 12:58 PM  Between 7am to 6pm - Pager - [phone]  After 6pm go to [email or web address] - password [email or web address] [facility]  [facility] [place] [facility]  Office  [phone]  CC: Primary care physician; Lauro RegulusAnderson, Marshall W, MD  Note: This dictation was prepared with Dragon dictation along with smaller phrase technology. Any transcriptional errors that result from this process are unintentional.

## 2017-04-12 ENCOUNTER — Inpatient Hospital Stay: Payer: Medicare Other

## 2017-04-12 LAB — CBC
HEMATOCRIT: 37.4 % — AB (ref 40.0–52.0)
HEMOGLOBIN: 12.4 g/dL — AB (ref 13.0–18.0)
MCH: 29.9 pg (ref 26.0–34.0)
MCHC: 33.1 g/dL (ref 32.0–36.0)
MCV: 90.1 fL (ref 80.0–100.0)
Platelets: 172 10*3/uL (ref 150–440)
RBC: 4.15 MIL/uL — ABNORMAL LOW (ref 4.40–5.90)
RDW: 15.1 % — ABNORMAL HIGH (ref 11.5–14.5)
WBC: 9.4 10*3/uL (ref 3.8–10.6)

## 2017-04-12 MED ORDER — ACETAMINOPHEN 325 MG PO TABS: 650.0000 mg | ORAL_TABLET | Freq: Four times a day (QID) | ORAL | Status: DC | PRN

## 2017-04-12 MED ORDER — LOSARTAN POTASSIUM 100 MG PO TABS: 100.0000 mg | ORAL_TABLET | Freq: Every day | ORAL | Status: DC

## 2017-04-12 MED ORDER — BUTALBITAL-APAP-CAFFEINE 50-325-40 MG PO TABS
1.0000 | ORAL_TABLET | Freq: Four times a day (QID) | ORAL | Status: DC | PRN
  Administered 2017-04-12: 1 via ORAL
  Filled 2017-04-12: qty 1

## 2017-04-12 MED ORDER — ZOLPIDEM TARTRATE 5 MG PO TABS: 2.5000 mg | ORAL_TABLET | Freq: Every evening | ORAL | 0 refills | Status: DC | PRN

## 2017-04-12 MED ORDER — ASPIRIN 81 MG PO TBEC: 81.0000 mg | DELAYED_RELEASE_TABLET | Freq: Every day | ORAL | Status: DC

## 2017-04-12 MED ORDER — PREDNISONE 20 MG PO TABS: 40.0000 mg | ORAL_TABLET | Freq: Every day | ORAL | 0 refills | Status: DC

## 2017-04-12 MED ORDER — DOCUSATE SODIUM 100 MG PO CAPS: 100.0000 mg | ORAL_CAPSULE | Freq: Two times a day (BID) | ORAL | 0 refills | Status: DC

## 2017-04-12 MED ORDER — FUROSEMIDE 20 MG PO TABS: 20.0000 mg | ORAL_TABLET | Freq: Every day | ORAL | 11 refills | Status: DC

## 2017-04-12 MED ORDER — METOPROLOL TARTRATE 25 MG PO TABS: 25.0000 mg | ORAL_TABLET | Freq: Two times a day (BID) | ORAL | Status: DC

## 2017-04-12 MED ORDER — LEVOFLOXACIN 500 MG PO TABS: 500.0000 mg | ORAL_TABLET | Freq: Every day | ORAL | 0 refills | Status: DC

## 2017-04-12 MED ORDER — TRAMADOL HCL 50 MG PO TABS: 50.0000 mg | ORAL_TABLET | Freq: Four times a day (QID) | ORAL | 0 refills | Status: DC | PRN

## 2017-04-12 NOTE — Progress Notes (Signed)
Pt discharged to [facility]. EMS picked up pt. VSS, IV and heart monitor removed. Report called to RN at facility.

## 2017-04-12 NOTE — Progress Notes (Signed)
[facility] - [place] at [facility]   PATIENT NAME: Ronald Weber    MR#:  [id number]  DATE OF BIRTH:  1929-02-20  SUBJECTIVE:  CHIEF COMPLAINT:   Chief Complaint  Patient presents with  . Fall  . Cough  . Weakness   Still has cough and weakness. Afebrile  REVIEW OF SYSTEMS:    Review of Systems  Constitutional: Positive for malaise/fatigue. Negative for chills and fever.  HENT: Negative for sore throat.   Eyes: Negative for blurred vision, double vision and pain.  Respiratory: Positive for cough, shortness of breath and wheezing. Negative for hemoptysis.   Cardiovascular: Negative for chest pain, palpitations, orthopnea and leg swelling.  Gastrointestinal: Negative for abdominal pain, constipation, diarrhea, heartburn, nausea and vomiting.  Genitourinary: Negative for dysuria and hematuria.  Musculoskeletal: Negative for back pain and joint pain.  Skin: Negative for rash.  Neurological: Positive for weakness. Negative for sensory change, speech change, focal weakness and headaches.  Endo/Heme/Allergies: Does not bruise/bleed easily.  Psychiatric/Behavioral: Negative for depression. The patient is not nervous/anxious.     DRUG ALLERGIES:   Allergies  Allergen Reactions  . Colchicine   . Doxycycline Other (See Comments)  . Meloxicam Other (See Comments)  . Sulfa Antibiotics     Other reaction(s): UNKNOWN  . Tussin  [Guaifenesin] Other (See Comments)  . Amoxicillin-Pot Clavulanate Rash    VITALS:  Blood pressure 126/78, pulse 68, temperature 98.6 F (37 C), temperature source Oral, resp. rate 18, height 5\' 10"  (1.778 m), weight 76.8 kg (169 lb 6.4 oz), SpO2 90 %.  PHYSICAL EXAMINATION:   Physical Exam  GENERAL:  83 y.o.-year-old patient lying in the bed audible wheezing and coarse breath sounds EYES: Extraocular muscles intact.  HEENT: Head atraumatic, normocephalic. Oropharynx and nasopharynx clear.  NECK:  Supple, no jugular venous distention.  No thyroid enlargeme  LUNGS: Bilateral wheezing with coarse sounds CARDIOVASCULAR: S1, S2 normal. No murmurs, rubs, or gallops.  ABDOMEN: Soft, nontender, nondistended.  EXTREMITIES: No cyanosis, clubbing or edema b/l.    NEUROLOGIC: Moves all 4 extremities  PSYCHIATRIC: The patient is alert and oriented x 3.  SKIN: No obvious rash, lesion, or ulcer.  Bruising in multiple sites  LABORATORY PANEL:   CBC Recent Labs  Lab 04/12/17 0459  WBC 9.4  HGB 12.4*  HCT 37.4*  PLT 172   ------------------------------------------------------------------------------------------------------------------ Chemistries  Recent Labs  Lab 04/09/17 0120  04/11/17 0509  NA 136   < > 138  K 4.7   < > 4.2  CL 105   < > 108  CO2 22   < > 25  GLUCOSE 100*   < > 110*  BUN 34*   < > 37*  CREATININE 1.73*   < > 1.51*  CALCIUM 8.2*   < > 8.6*  AST 178*  --   --   ALT 56  --   --   ALKPHOS 44  --   --   BILITOT 1.0  --   --    < > = values in this interval not displayed.   ------------------------------------------------------------------------------------------------------------------  Cardiac Enzymes Recent Labs  Lab 04/09/17 0153  TROPONINI 0.60*   ------------------------------------------------------------------------------------------------------------------  RADIOLOGY:  No results found.   ASSESSMENT AND PLAN:   * Left lower lobe pneumonia with acute hypoxic respiratory failure  Levaquin.  Prednisone.  Nebulizers. Off oxygen Repeat CXR shows no worsening.  *Acute rhabdomyolysis.  CK improving from [phone]-->2500-->1000 IVF stopped.  *Acute kidney injury over CKD stage III due  to rhabdomyolysis.  Resolved. Baseline 1.5  * Elevated troponin likely due to rhabdomyolysis and demand ischemia. Echo is unchanged EF 45% with diffuse hypokinesis ON ASA, metoprolol. No statin due to rhabdomyolysis. Discussed with Dr. Gwen PoundsKowalski. Will treat  medically for possible NSTEMI with meds.  OP w/u. Cardiac rehab referral  *DVT prophylaxis with heparin  CODE STATUS: FULL CODE  TOTAL TIME TAKING CARE OF THIS PATIENT: 30 minutes.   D/c when bed available  Milagros LollSudini, Cylie Dor R M.D on 04/12/2017 at 12:17 PM  Between 7am to 6pm - Pager - [phone]  After 6pm go to [email or web address] - password [email or web address] [facility]  [facility] [place] [facility]  Office  [phone]  CC: Primary care physician; Lauro RegulusAnderson, Marshall W, MD  Note: This dictation was prepared with Dragon dictation along with smaller phrase technology. Any transcriptional errors that result from this process are unintentional.

## 2017-04-12 NOTE — Clinical Social Work Placement (Signed)
   CLINICAL SOCIAL WORK PLACEMENT  NOTE  Date:  04/12/2017  Patient Details  Name: Ronald Weber MRN: [id number] Date of Birth: Apr 17, 1929  Clinical Social Work is seeking post-discharge placement for this patient at the Skilled  Nursing Facility level of care (*CSW will initial, date and re-position this form in  chart as items are completed):  Yes   Patient/family provided with [place] Clinical Social Work Department's list of facilities offering this level of care within the geographic area requested by the patient (or if unable, by the patient's family).  Yes   Patient/family informed of their freedom to choose among providers that offer the needed level of care, that participate in Medicare, Medicaid or managed care program needed by the patient, have an available bed and are willing to accept the patient.  Yes   Patient/family informed of [facility]'s ownership interest in [facility] and [facility], as well as of the fact that they are under no obligation to receive care at these facilities.  PASRR submitted to EDS on 04/10/17     PASRR number received on 04/11/17     Existing PASRR number confirmed on       FL2 transmitted to all facilities in geographic area requested by pt/family on 04/11/17     FL2 transmitted to all facilities within larger geographic area on       Patient informed that his/her managed care company has contracts with or will negotiate with certain facilities, including the following:        Yes   Patient/family informed of bed offers received.  [facility]     Physician recommends and patient chooses bed at      Patient to be transferred to [facility] on 04/12/17.  Patient to be transferred to facility [facility]     Patient family notified on 04/12/17 of transfer.  Name of family member notified:  Patient's son [name]     PHYSICIAN       Additional Comment:     _______________________________________________ Darleene CleaverAnterhaus, Emina Ribaudo R, LCSWA 04/12/2017, 5:46 PM

## 2017-04-12 NOTE — Progress Notes (Signed)
Patient was complaining of a headache, received tylenol at 0100. Next dose due at 0700. Notified physician for pain medication. Received orders from Dr. Sheryle Hailiamond.

## 2017-04-12 NOTE — Clinical Social Work Note (Addendum)
Patient to be d/c'ed today to [facility].  Patient and family agreeable to plans will transport via ems RN to call report to Kon[facility] Dolores[facility] Hicks room [phone].  CSW updated patient and his son [name] that he will be discharging today.  Windell MouldingEric Kaithlyn Teagle, MSW, Theresia MajorsLCSWA [phone]

## 2017-04-12 NOTE — Clinical Social Work Note (Addendum)
CSW has received [facility] number [phone], CSW presented bed offers to patient and son, he is going to tour [facility] and [facility] to make decision and call CSW back.  CSW awaiting for decision by patient's son.  CSW received phone call back from patient's son [name] and he chose [facility] [place] [facility] for short term rehab.  CSW spoke to [facility] and they can accept patient once he is medically ready for discharge and orders have been received.  Ervin KnackEric R. Nesa Distel, MSW, Theresia MajorsLCSWA [phone]  04/12/2017 11:55 AM

## 2017-04-12 NOTE — Discharge Summary (Signed)
[facility] - [place] at [facility]   PATIENT NAME: Ronald Weber    MR#:  [id number]  DATE OF BIRTH:  18-Aug-1928  DATE OF ADMISSION:  04/08/2017 ADMITTING PHYSICIAN: Marguarite Arbour, MD  DATE OF DISCHARGE: No discharge date for patient encounter.  PRIMARY CARE PHYSICIAN: Lauro Regulus, MD   ADMISSION DIAGNOSIS:  Acute respiratory failure with hypoxia (HCC) [J96.01] AKI (acute kidney injury) (HCC) [N17.9] Traumatic rhabdomyolysis, initial encounter (HCC) [T79.6XXA]  DISCHARGE DIAGNOSIS:  Principal Problem:   Acute respiratory failure (HCC) Active Problems:   CAP (community acquired pneumonia)   Rhabdomyolysis   Elevated troponin   SECONDARY DIAGNOSIS:   Past Medical History:  Diagnosis Date  . Anxiety   . Depression   . Gout   . Urge incontinence      ADMITTING HISTORY  HPI: Ronald Weber is a 83 y.o. male has a past medical history significant for anxiety/depression, and CHB s/p pacer who has been weak and SOB recently. Fell last night and remained on the foor over night until he was found by family and sent to ER. In Er, pt hypoxic with LLL pneumonia. CPK severely elevated c/w rhabdo and troponin elevated as well. Denies Cp. He is now admitted. Requiring O2 in the ER despite IVF steroids and SVn's    HOSPITAL COURSE:    * Left lower lobe pneumonia with acute hypoxic respiratory failure  Levaquin.  Prednisone.  Nebulizers. Off oxygen Repeat CXR shows improvement of pneumonia.  No pulmonary edema.  *Acute rhabdomyolysis.  CK improving from [phone]-->2500-->1000 IVF stopped.  *Acute kidney injury over CKD stage III due to rhabdomyolysis.  Resolved. Baseline 1.5  * Elevated troponin likely due to rhabdomyolysis and demand ischemia. Echo is unchanged EF 45% with diffuse hypokinesis ON ASA, metoprolol. No statin due to rhabdomyolysis. Discussed with Dr. Gwen Pounds. Will treat  medically for possible NSTEMI with meds. OP w/u. Cardiac  rehab referral  *DVT prophylaxis with heparin  Patient still has mild wheezing and cough and feels weak.  Will need discharge to skilled nursing facility with oral antibiotics, prednisone.  Follow-up with cardiology/pulmonary in 1 week and cardiac referral rehab.  Stable for discharge.   CONSULTS OBTAINED:    DRUG ALLERGIES:   Allergies  Allergen Reactions  . Colchicine   . Doxycycline Other (See Comments)  . Meloxicam Other (See Comments)  . Sulfa Antibiotics     Other reaction(s): UNKNOWN  . Tussin  [Guaifenesin] Other (See Comments)  . Amoxicillin-Pot Clavulanate Rash    DISCHARGE MEDICATIONS:   Current Discharge Medication List    START taking these medications   Details  aspirin EC 81 MG EC tablet Take 1 tablet (81 mg total) daily by mouth.    docusate sodium (COLACE) 100 MG capsule Take 1 capsule (100 mg total) 2 (two) times daily by mouth. Qty: 10 capsule, Refills: 0    furosemide (LASIX) 20 MG tablet Take 1 tablet (20 mg total) daily by mouth. Qty: 30 tablet, Refills: 11    levofloxacin (LEVAQUIN) 500 MG tablet Take 1 tablet (500 mg total) daily by mouth. Qty: 4 tablet, Refills: 0    losartan (COZAAR) 100 MG tablet Take 1 tablet (100 mg total) daily by mouth.    metoprolol tartrate (LOPRESSOR) 25 MG tablet Take 1 tablet (25 mg total) 2 (two) times daily by mouth.    predniSONE (DELTASONE) 20 MG tablet Take 2 tablets (40 mg total) daily with breakfast by mouth. Qty: 8 tablet, Refills: 0  CONTINUE these medications which have CHANGED   Details  acetaminophen (TYLENOL) 325 MG tablet Take 2 tablets (650 mg total) every 6 (six) hours as needed by mouth for mild pain or moderate pain.    traMADol (ULTRAM) 50 MG tablet Take 1 tablet (50 mg total) every 6 (six) hours as needed by mouth for severe pain. Qty: 20 tablet, Refills: 0    zolpidem (AMBIEN) 5 MG tablet Take 0.5 tablets (2.5 mg total) at bedtime as needed by mouth for sleep. Qty: 5 tablet,  Refills: 0      CONTINUE these medications which have NOT CHANGED   Details  hydrOXYzine (ATARAX/VISTARIL) 25 MG tablet Take 25 mg by mouth at bedtime.     sertraline (ZOLOFT) 25 MG tablet Take 25 mg by mouth daily.    tolterodine (DETROL LA) 4 MG 24 hr capsule Take 1 capsule (4 mg total) by mouth daily. Qty: 30 capsule, Refills: 11    TRAVATAN Z 0.004 % SOLN ophthalmic solution Place 1 drop into both eyes daily.      STOP taking these medications     losartan-hydrochlorothiazide (HYZAAR) 100-12.5 MG tablet         Today   VITAL SIGNS:  Blood pressure 108/60, pulse 73, temperature 98.8 F (37.1 C), temperature source Oral, resp. rate 18, height 5\' 10"  (1.778 m), weight 76.8 kg (169 lb 6.4 oz), SpO2 92 %.  I/O:    Intake/Output Summary (Last 24 hours) at 04/12/2017 1513 Last data filed at 04/12/2017 1430 Gross per 24 hour  Intake 360 ml  Output 975 ml  Net -615 ml    PHYSICAL EXAMINATION:  Physical Exam  GENERAL:  83 y.o.-year-old patient lying in the bed with no acute distress.  LUNGS: Mild wheeezing CARDIOVASCULAR: S1, S2 normal. No murmurs, rubs, or gallops.  ABDOMEN: Soft, non-tender, non-distended. Bowel sounds present. No organomegaly or mass.  NEUROLOGIC: Moves all 4 extremities. PSYCHIATRIC: The patient is alert and oriented x 3.  SKIN: No obvious rash, lesion, or ulcer.   DATA REVIEW:   CBC Recent Labs  Lab 04/12/17 0459  WBC 9.4  HGB 12.4*  HCT 37.4*  PLT 172    Chemistries  Recent Labs  Lab 04/09/17 0120  04/11/17 0509  NA 136   < > 138  K 4.7   < > 4.2  CL 105   < > 108  CO2 22   < > 25  GLUCOSE 100*   < > 110*  BUN 34*   < > 37*  CREATININE 1.73*   < > 1.51*  CALCIUM 8.2*   < > 8.6*  AST 178*  --   --   ALT 56  --   --   ALKPHOS 44  --   --   BILITOT 1.0  --   --    < > = values in this interval not displayed.    Cardiac Enzymes Recent Labs  Lab 04/09/17 0153  TROPONINI 0.60*    Microbiology Results  Results for  orders placed or performed in visit on 09/28/15  Microscopic Examination     Status: Abnormal   Collection Time: 09/28/15  2:51 PM  Result Value Ref Range Status   WBC, UA 0-5 0 - 5 /hpf Final   RBC, UA 3-10 (A) 0 - 2 /hpf Final   Epithelial Cells (non renal) 0-10 0 - 10 /hpf Final   Bacteria, UA None seen None seen/Few Final    RADIOLOGY:  Dg Chest 2  View  Result Date: 04/12/2017 CLINICAL DATA:  Hypoxia, smoker EXAM: CHEST  2 VIEW COMPARISON:  04/10/2017 FINDINGS: Retrocardiac opacity, best visualized on the lateral view, suspicious for lower lobe pneumonia. This is mildly improved compared to the recent prior. Additional mild opacity along the medial right lung base. The heart is normal in size.  Left subclavian pacemaker. Degenerative changes of the visualized thoracolumbar spine. IMPRESSION: Left lower lobe opacity, suspicious for pneumonia, mildly improved. Electronically Signed   By: Charline BillsSriyesh  Krishnan M.D.   On: 04/12/2017 15:02    Follow up with PCP in 1 week.  Management plans discussed with the patient, family and they are in agreement.  CODE STATUS:     Code Status Orders  (From admission, onward)        Start     Ordered   04/08/17 1353  Full code  Continuous     04/08/17 1352    Code Status History    Date Active Date Inactive Code Status Order ID Comments User Context   This patient has a current code status but no historical code status.    Advance Directive Documentation     Most Recent Value  Type of Advance Directive  Living will  Pre-existing out of facility DNR order (yellow form or pink MOST form)  No data  "MOST" Form in Place?  No data      TOTAL TIME TAKING CARE OF THIS PATIENT ON DAY OF DISCHARGE: more than 30 minutes.   Milagros LollSudini, Keneshia Tena R M.D on 04/12/2017 at 3:13 PM  Between 7am to 6pm - Pager - [phone]  After 6pm go to [email or web address] - password [email or web address] [facility]  [facility] [place] [facility]  Office  [phone]  CC: Primary care physician;  Lauro RegulusAnderson, Marshall W, MD  Note: This dictation was prepared with Dragon dictation along with smaller phrase technology. Any transcriptional errors that result from this process are unintentional.

## 2017-04-12 NOTE — Care Management Important Message (Signed)
Important Message  Patient Details  Name: Ronald Weber MRN: [id number] Date of Birth: [birth year]   Medicare Important Message Given:  Yes Signed IM notice given   Eber HongGreene, Tifani Dack R, RN 04/12/2017, 2:59 PM

## 2017-04-19 NOTE — Progress Notes (Deleted)
The patient is an 83 year old male who was recently in the hospital for pneumonia.  Images personally reviewed, chest x-ray 04/12/17, reduced right-sided lung volumes, mild emphysematous changes, otherwise normal

## 2017-04-20 ENCOUNTER — Inpatient Hospital Stay: Payer: Medicare Other | Admitting: Internal Medicine

## 2017-04-26 ENCOUNTER — Encounter: Payer: Self-pay | Admitting: Internal Medicine

## 2017-05-08 NOTE — Progress Notes (Signed)
[facility] Consultation      Assessment and Plan:  83 year old male with severe chronic bronchitis, deconditioning/debility, with recent hospitalization.  COPD/chronic bronchitis with copious secretions and recurrent exacerbations. -This is likely made worse by his inability to mobilize secretions, due to debility, reduced functional status. - I started him on azithromycin 250 mg daily, to be started after completing his course of Levaquin.  In addition I have asked him to use a flutter valve 3 times daily after using his nebulizer.  I have asked him to stop using his laxative while taking azithromycin. -Continue to increase activity as tolerated. -I have asked him to follow-up in 6 weeks, if he is doing better at that time we can reduce or eliminate the azithromycin.  Pneumonia. -Status post treatment with Levaquin. -Continue to follow.   Date: 05/10/2017  MRN# [id number] Ronald Weber 07/03/1928  Referring Physician:   HUEL CENTOLA is a 83 y.o. old male seen in consultation for chief complaint of:    Chief Complaint  Patient presents with  . Hospitalization Follow-up    Admitted 04/08/2017 pt c/o feet swelling, sob with and without exhertion, chest tightness, cough with yellow mucus.    HPI:   The patient is an 83 year old male who was recently in the hospital for pneumonia. He has been at [facility] for the past month, he is scheduled to go back to [facility].  He was in the hospital for pneumonia after falling. He was having bronchitis at the time of the fall. At the time of discharge he was on prednisone and abx. When he went to [facility] he developed recurrent cough and has been put on levaquin at [facility].  He has developed a UTI with ESBL and is on levaquin now.  He is present with his son today who gives some of the history.   He feels that his breathing has been ok at rest. He used to drive a car and could walk a grocery store without a  problem, he used a cane.  Currently he is able to do very little, he can not walk at all due to weakness.   He has a long history of chronic bronchitis, he was on no inhalers, he was treated with recurrent courses of abx and steroids when he got a bout, last was more than a year ago, these bouts occurred less than once per year.  He smoked 2 cigars per day until the recent hosptialization.   He is on symbicort 2 puffs twice daily, and a neb tid, prednisone 10 mg daily.   Images personally reviewed, chest x-ray 04/12/17, reduced right-sided lung volumes, mild emphysematous changes, otherwise normal   PMHX:   Past Medical History:  Diagnosis Date  . Anxiety   . Depression   . Gout   . Urge incontinence    Surgical Hx:  Past Surgical History:  Procedure Laterality Date  . HERNIA REPAIR    . PACEMAKER INSERTION     Family Hx:  Family History  Problem Relation Age of Onset  . Hematuria Neg Hx   . Kidney cancer Neg Hx   . Prostate cancer Neg Hx   . Bladder Cancer Neg Hx    Social Hx:   Social History   Tobacco Use  . Smoking status: Former Smoker    Types: Cigars    Last attempt to quit: 04/08/2017    Years since quitting: 0.0  . Smokeless tobacco: Never Used  . Tobacco  comment: quit 10 years  Substance Use Topics  . Alcohol use: Yes    Alcohol/week: 0.0 oz    Comment: occ  . Drug use: No   Medication:    Current Outpatient Medications:  .  acetaminophen (TYLENOL) 325 MG tablet, Take 2 tablets (650 mg total) every 6 (six) hours as needed by mouth for mild pain or moderate pain., Disp: , Rfl:  .  aspirin EC 81 MG EC tablet, Take 1 tablet (81 mg total) daily by mouth., Disp: , Rfl:  .  docusate sodium (COLACE) 100 MG capsule, Take 1 capsule (100 mg total) 2 (two) times daily by mouth., Disp: 10 capsule, Rfl: 0 .  furosemide (LASIX) 20 MG tablet, Take 1 tablet (20 mg total) daily by mouth., Disp: 30 tablet, Rfl: 11 .  hydrOXYzine (ATARAX/VISTARIL) 25 MG tablet, Take 25 mg  by mouth at bedtime. , Disp: , Rfl:  .  losartan (COZAAR) 100 MG tablet, Take 1 tablet (100 mg total) daily by mouth., Disp: , Rfl:  .  metoprolol tartrate (LOPRESSOR) 25 MG tablet, Take 1 tablet (25 mg total) 2 (two) times daily by mouth., Disp: , Rfl:  .  predniSONE (DELTASONE) 20 MG tablet, Take 2 tablets (40 mg total) daily with breakfast by mouth. (Patient taking differently: Take 10 mg by mouth daily with breakfast. ), Disp: 8 tablet, Rfl: 0 .  sertraline (ZOLOFT) 25 MG tablet, Take 25 mg by mouth daily., Disp: , Rfl:  .  tolterodine (DETROL LA) 4 MG 24 hr capsule, Take 1 capsule (4 mg total) by mouth daily., Disp: 30 capsule, Rfl: 11 .  traMADol (ULTRAM) 50 MG tablet, Take 1 tablet (50 mg total) every 6 (six) hours as needed by mouth for severe pain., Disp: 20 tablet, Rfl: 0 .  TRAVATAN Z 0.004 % SOLN ophthalmic solution, Place 1 drop into both eyes daily., Disp: , Rfl:  .  zolpidem (AMBIEN) 5 MG tablet, Take 0.5 tablets (2.5 mg total) at bedtime as needed by mouth for sleep., Disp: 5 tablet, Rfl: 0   Allergies:  Colchicine; Doxycycline; Meloxicam; Sulfa antibiotics; Tussin  [guaifenesin]; and Amoxicillin-pot clavulanate  Review of Systems: Gen:  Denies  fever, sweats, chills HEENT: Denies blurred vision, double vision. bleeds, sore throat Cvc:  No dizziness, chest pain. Resp:   Denies cough or sputum production, shortness of breath Gi: Denies swallowing difficulty, stomach pain. Gu:  Denies bladder incontinence, burning urine Ext:   No Joint pain, stiffness. Skin: No skin rash,  hives  Endoc:  No polyuria, polydipsia. Psych: No depression, insomnia. Other:  All other systems were reviewed with the patient and were negative other that what is mentioned in the HPI.   Physical Examination:   VS: BP 110/80 (BP Location: Left Arm, Cuff Size: Large)   Pulse (!) 120   Resp 16   Ht 5\' 10"  (1.778 m)   Wt 164 lb (74.4 kg)   SpO2 90%   BMI 23.53 kg/m   General Appearance: No  distress  Neuro:without focal findings,  speech normal,  HEENT: PERRLA, EOM intact.   Pulmonary: normal breath sounds, coarse bilateral breath sounds. CardiovascularNormal S1,S2.  No m/r/g.   Abdomen: Benign, Soft, non-tender. Renal:  No costovertebral tenderness  GU:  No performed at this time. Endoc: No evident thyromegaly, no signs of acromegaly. Skin:   warm, no rashes, no ecchymosis  Extremities: normal, no cyanosis, clubbing.  Other findings:    LABORATORY PANEL:   CBC No results for input(s): WBC,  HGB, HCT, PLT in the last 168 hours. ------------------------------------------------------------------------------------------------------------------  Chemistries  No results for input(s): NA, K, CL, CO2, GLUCOSE, BUN, CREATININE, CALCIUM, MG, AST, ALT, ALKPHOS, BILITOT in the last 168 hours.  Invalid input(s): GFRCGP ------------------------------------------------------------------------------------------------------------------  Cardiac Enzymes No results for input(s): TROPONINI in the last 168 hours. ------------------------------------------------------------  RADIOLOGY:  No results found.     Thank  you for the consultation and for allowing [facility] to assist in the care of your patient. Our recommendations are noted above.  Please contact us if we can be of further service.   Wells Guiles, MD.  Board Certified in Internal Medicine, Pulmonary Medicine, Critical Care Medicine, and Sleep Medicine.  [facility] Office Number: [phone]  Santiago Glad, M.D.  Billy Fischer, M.D  05/10/2017

## 2017-05-09 ENCOUNTER — Other Ambulatory Visit
Admission: RE | Admit: 2017-05-09 | Discharge: 2017-05-09 | Disposition: A | Discharge status: Home / Self Care | Payer: No Typology Code available for payment source | Source: Ambulatory Visit | Attending: Family Medicine | Admitting: Family Medicine

## 2017-05-09 DIAGNOSIS — R0602 Shortness of breath: Secondary | ICD-10-CM | POA: Insufficient documentation

## 2017-05-09 LAB — BRAIN NATRIURETIC PEPTIDE: B Natriuretic Peptide: 138 pg/mL — ABNORMAL HIGH (ref 0.0–100.0)

## 2017-05-10 ENCOUNTER — Telehealth: Payer: Self-pay | Admitting: Internal Medicine

## 2017-05-10 ENCOUNTER — Encounter: Payer: Self-pay | Admitting: Internal Medicine

## 2017-05-10 ENCOUNTER — Ambulatory Visit (INDEPENDENT_AMBULATORY_CARE_PROVIDER_SITE_OTHER): Payer: Medicare Other | Admitting: Internal Medicine

## 2017-05-10 VITALS — BP 110/80 | HR 120 | Resp 16 | Ht 70.0 in | Wt 164.0 lb

## 2017-05-10 DIAGNOSIS — J411 Mucopurulent chronic bronchitis: Secondary | ICD-10-CM

## 2017-05-10 MED ORDER — AZITHROMYCIN 250 MG PO TABS: ORAL_TABLET | ORAL | 11 refills | Status: DC

## 2017-05-10 NOTE — Telephone Encounter (Signed)
Rosalie DoctorLisa [facility] calling stating they need the new orders sent in on Azithromycin to them.  Need to know how long and how often patient is to take this  Please fax : [phone] Atnn: Misty StanleyLisa

## 2017-05-10 NOTE — Patient Instructions (Addendum)
Will start Azithromycin 250 mg every day AFTER stopping levaquin.  Continue symbicort.  Stop taking any laxative medication while using azithromycin. Use flutter valve three times daily after using the nebulizers.

## 2017-05-10 NOTE — Telephone Encounter (Signed)
OV note with instructions faxed to [facility]. Nothing further needed.

## 2017-05-16 ENCOUNTER — Inpatient Hospital Stay: Payer: Medicare Other

## 2017-05-16 ENCOUNTER — Encounter: Payer: Self-pay | Admitting: Emergency Medicine

## 2017-05-16 ENCOUNTER — Emergency Department: Payer: Medicare Other

## 2017-05-16 ENCOUNTER — Inpatient Hospital Stay: Admit: 2017-05-16 | Payer: Medicare Other

## 2017-05-16 ENCOUNTER — Inpatient Hospital Stay
Admission: EM | Admit: 2017-05-16 | Discharge: 2017-05-22 | DRG: 871 | Disposition: A | Discharge status: Hospice | Payer: Medicare Other | Attending: Internal Medicine | Admitting: Internal Medicine

## 2017-05-16 ENCOUNTER — Other Ambulatory Visit: Payer: Self-pay

## 2017-05-16 DIAGNOSIS — R710 Precipitous drop in hematocrit: Secondary | ICD-10-CM | POA: Diagnosis present

## 2017-05-16 DIAGNOSIS — R5381 Other malaise: Secondary | ICD-10-CM | POA: Diagnosis present

## 2017-05-16 DIAGNOSIS — J440 Chronic obstructive pulmonary disease with acute lower respiratory infection: Secondary | ICD-10-CM | POA: Diagnosis present

## 2017-05-16 DIAGNOSIS — Z7189 Other specified counseling: Secondary | ICD-10-CM

## 2017-05-16 DIAGNOSIS — M545 Low back pain: Secondary | ICD-10-CM | POA: Diagnosis present

## 2017-05-16 DIAGNOSIS — Z515 Encounter for palliative care: Secondary | ICD-10-CM | POA: Diagnosis present

## 2017-05-16 DIAGNOSIS — L89152 Pressure ulcer of sacral region, stage 2: Secondary | ICD-10-CM | POA: Diagnosis present

## 2017-05-16 DIAGNOSIS — G4733 Obstructive sleep apnea (adult) (pediatric): Secondary | ICD-10-CM | POA: Diagnosis present

## 2017-05-16 DIAGNOSIS — E872 Acidosis: Secondary | ICD-10-CM | POA: Diagnosis present

## 2017-05-16 DIAGNOSIS — I129 Hypertensive chronic kidney disease with stage 1 through stage 4 chronic kidney disease, or unspecified chronic kidney disease: Secondary | ICD-10-CM | POA: Diagnosis present

## 2017-05-16 DIAGNOSIS — L8990 Pressure ulcer of unspecified site, unspecified stage: Secondary | ICD-10-CM

## 2017-05-16 DIAGNOSIS — Y95 Nosocomial condition: Secondary | ICD-10-CM | POA: Diagnosis present

## 2017-05-16 DIAGNOSIS — A419 Sepsis, unspecified organism: Secondary | ICD-10-CM | POA: Diagnosis present

## 2017-05-16 DIAGNOSIS — I82402 Acute embolism and thrombosis of unspecified deep veins of left lower extremity: Secondary | ICD-10-CM | POA: Diagnosis present

## 2017-05-16 DIAGNOSIS — J9601 Acute respiratory failure with hypoxia: Secondary | ICD-10-CM | POA: Diagnosis present

## 2017-05-16 DIAGNOSIS — R609 Edema, unspecified: Secondary | ICD-10-CM | POA: Diagnosis present

## 2017-05-16 DIAGNOSIS — G8929 Other chronic pain: Secondary | ICD-10-CM | POA: Diagnosis present

## 2017-05-16 DIAGNOSIS — E875 Hyperkalemia: Secondary | ICD-10-CM | POA: Diagnosis present

## 2017-05-16 DIAGNOSIS — N189 Chronic kidney disease, unspecified: Secondary | ICD-10-CM

## 2017-05-16 DIAGNOSIS — Z7951 Long term (current) use of inhaled steroids: Secondary | ICD-10-CM

## 2017-05-16 DIAGNOSIS — Z888 Allergy status to other drugs, medicaments and biological substances status: Secondary | ICD-10-CM | POA: Diagnosis not present

## 2017-05-16 DIAGNOSIS — R0602 Shortness of breath: Secondary | ICD-10-CM | POA: Diagnosis not present

## 2017-05-16 DIAGNOSIS — R0902 Hypoxemia: Secondary | ICD-10-CM | POA: Diagnosis not present

## 2017-05-16 DIAGNOSIS — Z79899 Other long term (current) drug therapy: Secondary | ICD-10-CM

## 2017-05-16 DIAGNOSIS — N170 Acute kidney failure with tubular necrosis: Secondary | ICD-10-CM | POA: Diagnosis present

## 2017-05-16 DIAGNOSIS — Z66 Do not resuscitate: Secondary | ICD-10-CM | POA: Diagnosis present

## 2017-05-16 DIAGNOSIS — J189 Pneumonia, unspecified organism: Secondary | ICD-10-CM | POA: Diagnosis not present

## 2017-05-16 DIAGNOSIS — I2699 Other pulmonary embolism without acute cor pulmonale: Secondary | ICD-10-CM | POA: Diagnosis present

## 2017-05-16 DIAGNOSIS — E876 Hypokalemia: Secondary | ICD-10-CM | POA: Diagnosis present

## 2017-05-16 DIAGNOSIS — Z452 Encounter for adjustment and management of vascular access device: Secondary | ICD-10-CM | POA: Diagnosis not present

## 2017-05-16 DIAGNOSIS — Z7982 Long term (current) use of aspirin: Secondary | ICD-10-CM

## 2017-05-16 DIAGNOSIS — N183 Chronic kidney disease, stage 3 (moderate): Secondary | ICD-10-CM | POA: Diagnosis present

## 2017-05-16 DIAGNOSIS — J449 Chronic obstructive pulmonary disease, unspecified: Secondary | ICD-10-CM | POA: Diagnosis not present

## 2017-05-16 DIAGNOSIS — Z882 Allergy status to sulfonamides status: Secondary | ICD-10-CM

## 2017-05-16 DIAGNOSIS — R6521 Severe sepsis with septic shock: Secondary | ICD-10-CM | POA: Diagnosis present

## 2017-05-16 DIAGNOSIS — J411 Mucopurulent chronic bronchitis: Secondary | ICD-10-CM | POA: Diagnosis not present

## 2017-05-16 HISTORY — DX: Unspecified atrioventricular block: I44.30

## 2017-05-16 LAB — CBC WITH DIFFERENTIAL/PLATELET
BASOS ABS: 0 10*3/uL (ref 0–0.1)
BASOS PCT: 0 %
Eosinophils Absolute: 0 10*3/uL (ref 0–0.7)
Eosinophils Relative: 0 %
HEMATOCRIT: 39 % — AB (ref 40.0–52.0)
HEMOGLOBIN: 12.8 g/dL — AB (ref 13.0–18.0)
LYMPHS PCT: 8 %
Lymphs Abs: 1.4 10*3/uL (ref 1.0–3.6)
MCH: 28.8 pg (ref 26.0–34.0)
MCHC: 32.7 g/dL (ref 32.0–36.0)
MCV: 88.2 fL (ref 80.0–100.0)
MONO ABS: 1.3 10*3/uL — AB (ref 0.2–1.0)
Monocytes Relative: 8 %
NEUTROS ABS: 13.9 10*3/uL — AB (ref 1.4–6.5)
Neutrophils Relative %: 84 %
Platelets: 223 10*3/uL (ref 150–440)
RBC: 4.43 MIL/uL (ref 4.40–5.90)
RDW: 15.2 % — ABNORMAL HIGH (ref 11.5–14.5)
WBC: 16.6 10*3/uL — ABNORMAL HIGH (ref 3.8–10.6)

## 2017-05-16 LAB — URINALYSIS, COMPLETE (UACMP) WITH MICROSCOPIC
BACTERIA UA: NONE SEEN
Bilirubin Urine: NEGATIVE
Glucose, UA: NEGATIVE mg/dL
Hgb urine dipstick: NEGATIVE
Ketones, ur: NEGATIVE mg/dL
Leukocytes, UA: NEGATIVE
NITRITE: NEGATIVE
PROTEIN: NEGATIVE mg/dL
Specific Gravity, Urine: 1.02 (ref 1.005–1.030)
pH: 5 (ref 5.0–8.0)

## 2017-05-16 LAB — BASIC METABOLIC PANEL
Anion gap: 12 (ref 5–15)
BUN: 56 mg/dL — AB (ref 6–20)
CALCIUM: 7.9 mg/dL — AB (ref 8.9–10.3)
CO2: 17 mmol/L — ABNORMAL LOW (ref 22–32)
CREATININE: 4.32 mg/dL — AB (ref 0.61–1.24)
Chloride: 101 mmol/L (ref 101–111)
GFR calc non Af Amer: 11 mL/min — ABNORMAL LOW (ref >60)
GFR, EST AFRICAN AMERICAN: 13 mL/min — AB (ref >60)
Glucose, Bld: 137 mg/dL — ABNORMAL HIGH (ref 65–99)
Potassium: 6.6 mmol/L (ref 3.5–5.1)
SODIUM: 130 mmol/L — AB (ref 135–145)

## 2017-05-16 LAB — HEPARIN LEVEL (UNFRACTIONATED): Heparin Unfractionated: 0.58 IU/mL (ref 0.30–0.70)

## 2017-05-16 LAB — APTT: aPTT: 36 seconds (ref 24–36)

## 2017-05-16 LAB — LACTIC ACID, PLASMA
LACTIC ACID, VENOUS: 2.5 mmol/L — AB (ref 0.5–1.9)
LACTIC ACID, VENOUS: 1.9 mmol/L (ref 0.5–1.9)

## 2017-05-16 LAB — COMPREHENSIVE METABOLIC PANEL
ALT: 57 U/L (ref 17–63)
AST: 71 U/L — AB (ref 15–41)
Albumin: <1 g/dL — ABNORMAL LOW (ref 3.5–5.0)
Alkaline Phosphatase: 54 U/L (ref 38–126)
Anion gap: 9 (ref 5–15)
BUN: 34 mg/dL — ABNORMAL HIGH (ref 6–20)
CHLORIDE: 121 mmol/L — AB (ref 101–111)
CO2: 9 mmol/L — AB (ref 22–32)
CREATININE: 2.28 mg/dL — AB (ref 0.61–1.24)
Calcium: <4 mg/dL — CL (ref 8.9–10.3)
GFR, EST AFRICAN AMERICAN: 28 mL/min — AB (ref >60)
GFR, EST NON AFRICAN AMERICAN: 24 mL/min — AB (ref >60)
Glucose, Bld: 56 mg/dL — ABNORMAL LOW (ref 65–99)
POTASSIUM: 2.7 mmol/L — AB (ref 3.5–5.1)
SODIUM: 139 mmol/L (ref 135–145)
Total Bilirubin: 0.7 mg/dL (ref 0.3–1.2)
Total Protein: <3 g/dL — ABNORMAL LOW (ref 6.5–8.1)

## 2017-05-16 LAB — TROPONIN I: Troponin I: 0.11 ng/mL (ref <0.03)

## 2017-05-16 LAB — PROCALCITONIN: PROCALCITONIN: 0.67 ng/mL

## 2017-05-16 LAB — MRSA PCR SCREENING: MRSA by PCR: POSITIVE — AB

## 2017-05-16 LAB — POTASSIUM: POTASSIUM: 6.5 mmol/L — AB (ref 3.5–5.1)

## 2017-05-16 LAB — MAGNESIUM
MAGNESIUM: 0.8 mg/dL — AB (ref 1.7–2.4)
MAGNESIUM: 3 mg/dL — AB (ref 1.7–2.4)
Magnesium: 2.8 mg/dL — ABNORMAL HIGH (ref 1.7–2.4)

## 2017-05-16 LAB — PROTIME-INR
INR: 1.24
Prothrombin Time: 15.5 seconds — ABNORMAL HIGH (ref 11.4–15.2)

## 2017-05-16 LAB — GLUCOSE, CAPILLARY: Glucose-Capillary: 113 mg/dL — ABNORMAL HIGH (ref 65–99)

## 2017-05-16 LAB — PHOSPHORUS: Phosphorus: 3 mg/dL (ref 2.5–4.6)

## 2017-05-16 MED ORDER — SODIUM CHLORIDE 0.9 % IV SOLN
1.0000 g | Freq: Once | INTRAVENOUS | Status: AC
  Administered 2017-05-16: 1 g via INTRAVENOUS
  Filled 2017-05-16: qty 10

## 2017-05-16 MED ORDER — HEPARIN BOLUS VIA INFUSION
4000.0000 [IU] | Freq: Once | INTRAVENOUS | Status: AC
  Administered 2017-05-16: 4000 [IU] via INTRAVENOUS
  Filled 2017-05-16: qty 4000

## 2017-05-16 MED ORDER — ONDANSETRON HCL 4 MG/2ML IJ SOLN: 4.0000 mg | Freq: Four times a day (QID) | INTRAMUSCULAR | Status: DC | PRN

## 2017-05-16 MED ORDER — SODIUM POLYSTYRENE SULFONATE 15 GM/60ML PO SUSP
30.0000 g | Freq: Once | ORAL | Status: AC
  Administered 2017-05-16: 30 g
  Filled 2017-05-16: qty 120

## 2017-05-16 MED ORDER — ACETAMINOPHEN 325 MG PO TABS
650.0000 mg | ORAL_TABLET | Freq: Four times a day (QID) | ORAL | Status: DC | PRN
  Administered 2017-05-16 – 2017-05-17 (×2): 650 mg via ORAL
  Filled 2017-05-16 (×2): qty 2

## 2017-05-16 MED ORDER — LATANOPROST 0.005 % OP SOLN
1.0000 [drp] | Freq: Every day | OPHTHALMIC | Status: DC
  Administered 2017-05-16 – 2017-05-18 (×3): 1 [drp] via OPHTHALMIC
  Filled 2017-05-16: qty 2.5

## 2017-05-16 MED ORDER — SODIUM CHLORIDE 0.9 % IV BOLUS (SEPSIS)
1000.0000 mL | Freq: Once | INTRAVENOUS | Status: AC
  Administered 2017-05-16: 1000 mL via INTRAVENOUS

## 2017-05-16 MED ORDER — MAGNESIUM SULFATE 4 GM/100ML IV SOLN
4.0000 g | Freq: Once | INTRAVENOUS | Status: AC
  Administered 2017-05-16: 4 g via INTRAVENOUS
  Filled 2017-05-16: qty 100

## 2017-05-16 MED ORDER — BUDESONIDE 0.25 MG/2ML IN SUSP
0.2500 mg | Freq: Two times a day (BID) | RESPIRATORY_TRACT | Status: DC
  Administered 2017-05-16 – 2017-05-19 (×6): 0.25 mg via RESPIRATORY_TRACT
  Filled 2017-05-16 (×6): qty 2

## 2017-05-16 MED ORDER — LEVOFLOXACIN IN D5W 750 MG/150ML IV SOLN
750.0000 mg | Freq: Once | INTRAVENOUS | Status: AC
  Administered 2017-05-16: 750 mg via INTRAVENOUS
  Filled 2017-05-16: qty 150

## 2017-05-16 MED ORDER — TRAMADOL HCL 50 MG PO TABS: 50.0000 mg | ORAL_TABLET | Freq: Four times a day (QID) | ORAL | Status: DC | PRN

## 2017-05-16 MED ORDER — FUROSEMIDE 20 MG PO TABS
20.0000 mg | ORAL_TABLET | Freq: Every day | ORAL | Status: DC
  Administered 2017-05-16: 20 mg via ORAL
  Filled 2017-05-16: qty 1

## 2017-05-16 MED ORDER — DEXTROSE 5 % IV SOLN
2.0000 g | Freq: Once | INTRAVENOUS | Status: AC
  Administered 2017-05-16: 2 g via INTRAVENOUS
  Filled 2017-05-16: qty 2

## 2017-05-16 MED ORDER — INSULIN REGULAR HUMAN 100 UNIT/ML IJ SOLN
10.0000 [IU] | Freq: Once | INTRAMUSCULAR | Status: AC
  Administered 2017-05-16: 10 [IU] via INTRAVENOUS
  Filled 2017-05-16: qty 0.1

## 2017-05-16 MED ORDER — DEXTROSE 5 % IV SOLN
2.0000 g | Freq: Every day | INTRAVENOUS | Status: DC
  Administered 2017-05-16: 2 g via INTRAVENOUS
  Filled 2017-05-16 (×2): qty 2

## 2017-05-16 MED ORDER — VANCOMYCIN HCL IN DEXTROSE 1-5 GM/200ML-% IV SOLN
1000.0000 mg | INTRAVENOUS | Status: DC
  Administered 2017-05-16: 1000 mg via INTRAVENOUS
  Filled 2017-05-16: qty 200

## 2017-05-16 MED ORDER — VANCOMYCIN HCL IN DEXTROSE 1-5 GM/200ML-% IV SOLN
1000.0000 mg | Freq: Once | INTRAVENOUS | Status: AC
  Administered 2017-05-16: 1000 mg via INTRAVENOUS
  Filled 2017-05-16: qty 200

## 2017-05-16 MED ORDER — ENSURE ENLIVE PO LIQD
237.0000 mL | Freq: Three times a day (TID) | ORAL | Status: DC
  Administered 2017-05-17: 237 mL via ORAL

## 2017-05-16 MED ORDER — HEPARIN (PORCINE) IN NACL 100-0.45 UNIT/ML-% IJ SOLN
1250.0000 [IU]/h | INTRAMUSCULAR | Status: DC
  Administered 2017-05-16 – 2017-05-18 (×3): 1250 [IU]/h via INTRAVENOUS
  Filled 2017-05-16 (×4): qty 250

## 2017-05-16 MED ORDER — SERTRALINE HCL 50 MG PO TABS
25.0000 mg | ORAL_TABLET | Freq: Every day | ORAL | Status: DC
  Administered 2017-05-16 – 2017-05-17 (×2): 25 mg via ORAL
  Filled 2017-05-16 (×2): qty 1

## 2017-05-16 MED ORDER — IPRATROPIUM-ALBUTEROL 0.5-2.5 (3) MG/3ML IN SOLN
3.0000 mL | Freq: Four times a day (QID) | RESPIRATORY_TRACT | Status: DC
  Administered 2017-05-16 – 2017-05-17 (×3): 3 mL via RESPIRATORY_TRACT
  Filled 2017-05-16 (×3): qty 3

## 2017-05-16 MED ORDER — LIDOCAINE 5 % EX PTCH
1.0000 | MEDICATED_PATCH | CUTANEOUS | Status: DC
  Administered 2017-05-17 – 2017-05-18 (×2): 1 via TRANSDERMAL
  Filled 2017-05-16 (×3): qty 1

## 2017-05-16 MED ORDER — CHLORHEXIDINE GLUCONATE CLOTH 2 % EX PADS
6.0000 | MEDICATED_PAD | Freq: Every day | CUTANEOUS | Status: DC
  Administered 2017-05-18 – 2017-05-19 (×2): 6 via TOPICAL

## 2017-05-16 MED ORDER — DEXTROSE 50 % IV SOLN
25.0000 mL | Freq: Once | INTRAVENOUS | Status: AC
  Administered 2017-05-16: 25 mL via INTRAVENOUS
  Filled 2017-05-16: qty 50

## 2017-05-16 MED ORDER — ACETAMINOPHEN 650 MG RE SUPP: 650.0000 mg | Freq: Four times a day (QID) | RECTAL | Status: DC | PRN

## 2017-05-16 MED ORDER — ALBUTEROL SULFATE (2.5 MG/3ML) 0.083% IN NEBU: 2.5000 mg | INHALATION_SOLUTION | Freq: Four times a day (QID) | RESPIRATORY_TRACT | Status: DC

## 2017-05-16 MED ORDER — ONDANSETRON HCL 4 MG PO TABS: 4.0000 mg | ORAL_TABLET | Freq: Four times a day (QID) | ORAL | Status: DC | PRN

## 2017-05-16 MED ORDER — IOPAMIDOL (ISOVUE-370) INJECTION 76%
75.0000 mL | Freq: Once | INTRAVENOUS | Status: AC | PRN
  Administered 2017-05-16: 75 mL via INTRAVENOUS

## 2017-05-16 MED ORDER — FESOTERODINE FUMARATE ER 4 MG PO TB24
4.0000 mg | ORAL_TABLET | Freq: Every day | ORAL | Status: DC
  Administered 2017-05-16 – 2017-05-17 (×2): 4 mg via ORAL
  Filled 2017-05-16 (×4): qty 1

## 2017-05-16 MED ORDER — ACETYLCYSTEINE 20 % IN SOLN
4.0000 mL | Freq: Four times a day (QID) | RESPIRATORY_TRACT | Status: DC
  Administered 2017-05-16 – 2017-05-18 (×7): 4 mL via RESPIRATORY_TRACT
  Filled 2017-05-16 (×8): qty 4

## 2017-05-16 MED ORDER — ASPIRIN EC 81 MG PO TBEC
81.0000 mg | DELAYED_RELEASE_TABLET | Freq: Every day | ORAL | Status: DC
  Administered 2017-05-16: 81 mg via ORAL
  Filled 2017-05-16 (×2): qty 1

## 2017-05-16 MED ORDER — MAGNESIUM SULFATE 2 GM/50ML IV SOLN: 2.0000 g | Freq: Once | INTRAVENOUS | Status: DC

## 2017-05-16 MED ORDER — MUPIROCIN 2 % EX OINT
1.0000 "application " | TOPICAL_OINTMENT | Freq: Two times a day (BID) | CUTANEOUS | Status: DC
  Administered 2017-05-16 – 2017-05-19 (×6): 1 via NASAL
  Filled 2017-05-16: qty 22

## 2017-05-16 MED ORDER — MOMETASONE FURO-FORMOTEROL FUM 200-5 MCG/ACT IN AERO
2.0000 | INHALATION_SPRAY | Freq: Two times a day (BID) | RESPIRATORY_TRACT | Status: DC
  Filled 2017-05-16: qty 8.8

## 2017-05-16 MED ORDER — POTASSIUM CHLORIDE 10 MEQ/100ML IV SOLN
10.0000 meq | INTRAVENOUS | Status: AC
  Administered 2017-05-16 (×3): 10 meq via INTRAVENOUS
  Filled 2017-05-16 (×3): qty 100

## 2017-05-16 MED ORDER — SODIUM CHLORIDE 0.9 % IV SOLN
INTRAVENOUS | Status: DC
  Administered 2017-05-16: 12:00:00 via INTRAVENOUS

## 2017-05-16 MED ORDER — MORPHINE SULFATE (PF) 2 MG/ML IV SOLN
2.0000 mg | INTRAVENOUS | Status: DC | PRN
  Administered 2017-05-16 – 2017-05-17 (×5): 2 mg via INTRAVENOUS
  Filled 2017-05-16 (×5): qty 1

## 2017-05-16 MED ORDER — POLYETHYLENE GLYCOL 3350 17 G PO PACK: 17.0000 g | PACK | Freq: Every day | ORAL | Status: DC | PRN

## 2017-05-16 NOTE — Progress Notes (Signed)
PHARMACY - CRITICAL CARE PROGRESS NOTE  Pharmacy Consult for electrolytes  Indication: hyperkalemia    Allergies  Allergen Reactions  . Colchicine   . Doxycycline Other (See Comments)  . Meloxicam Other (See Comments)  . Sulfa Antibiotics     Other reaction(s): UNKNOWN  . Tussin  [Guaifenesin] Other (See Comments)  . Amoxicillin-Pot Clavulanate Rash    Patient Measurements: Height: 5\' 10"  (177.8 cm) Weight: 165 lb (74.8 kg) IBW/kg (Calculated) : 73 Adjusted Body Weight:   Vital Signs: Temp: 97.4 F (36.3 C) (12/11 2000) Temp Source: Axillary (12/11 2000) BP: 83/57 (12/11 2100) Pulse Rate: 81 (12/11 2100) Intake/Output from previous day: No intake/output data recorded. Intake/Output from this shift: Total I/O In: -  Out: 200 [Urine:200] Vent settings for last 24 hours:    Labs: Recent Labs    05/16/17 0931 05/16/17 1300 05/16/17 1958  WBC 16.6*  --   --   HGB 12.8*  --   --   HCT 39.0*  --   --   PLT 223  --   --   APTT 36  --   --   INR 1.24  --   --   CREATININE 2.28*  --  4.32*  MG  --  0.8* 2.8*  PHOS  --  3.0  --   ALBUMIN <1.0*  --   --   PROT <3.0*  --   --   AST 71*  --   --   ALT 57  --   --   ALKPHOS 54  --   --   BILITOT 0.7  --   --    Estimated Creatinine Clearance: 12.7 mL/min (A) (by C-G formula based on SCr of 4.32 mg/dL (H)).  Recent Labs    05/16/17 1243  GLUCAP 113*    Microbiology: Recent Results (from the past 720 hour(s))  MRSA PCR Screening     Status: Abnormal   Collection Time: 05/16/17  1:18 PM  Result Value Ref Range Status   MRSA by PCR POSITIVE (A) NEGATIVE Final    Comment:        The GeneXpert MRSA Assay (FDA approved for NASAL specimens only), is one component of a comprehensive MRSA colonization surveillance program. It is not intended to diagnose MRSA infection nor to guide or monitor treatment for MRSA infections. RESULT CALLED TO, READ BACK BY AND VERIFIED WITH: JAKE ROBINSON 05/16/17 1731 KLW      Medications:  Scheduled:  . acetylcysteine  4 mL Nebulization Q6H  . aspirin EC  81 mg Oral Daily  . budesonide (PULMICORT) nebulizer solution  0.25 mg Nebulization Q12H  . [START ON 05/17/2017] Chlorhexidine Gluconate Cloth  6 each Topical Q0600  . feeding supplement (ENSURE ENLIVE)  237 mL Oral TID BM  . fesoterodine  4 mg Oral Daily  . furosemide  20 mg Oral Daily  . ipratropium-albuterol  3 mL Nebulization Q6H  . latanoprost  1 drop Both Eyes QHS  . [START ON 05/17/2017] lidocaine  1 patch Transdermal Q24H  . mupirocin ointment  1 application Nasal BID  . sertraline  25 mg Oral Daily    Assessment: 12/11:  K @ 20:00 = 6.6   Goal of Therapy:  K within normal limits   Plan:  Pt had just received 30 mEq of KCl ,  NP believes serum K result is due to lab error or incorrect blood draw.  NP will recheck K and treat as needed.   Alonie Gazzola D 05/16/2017,9:32  PM

## 2017-05-16 NOTE — Progress Notes (Signed)
ANTICOAGULATION CONSULT NOTE - Initial Consult  Pharmacy Consult for Heparin drip Indication: pulmonary embolus  Allergies  Allergen Reactions  . Colchicine   . Doxycycline Other (See Comments)  . Meloxicam Other (See Comments)  . Sulfa Antibiotics     Other reaction(s): UNKNOWN  . Tussin  [Guaifenesin] Other (See Comments)  . Amoxicillin-Pot Clavulanate Rash    Patient Measurements: Height: 5\' 10"  (177.8 cm) Weight: 165 lb (74.8 kg) IBW/kg (Calculated) : 73 Heparin Dosing Weight: 74.8 kg   Vital Signs: BP: 98/51 (12/11 1050) Pulse Rate: 95 (12/11 1050)  Labs: Recent Labs    05/16/17 0931  HGB 12.8*  HCT 39.0*  PLT 223  LABPROT 15.5*  INR 1.24  CREATININE 2.28*    Estimated Creatinine Clearance: 24 mL/min (A) (by C-G formula based on SCr of 2.28 mg/dL (H)).   Medical History: Past Medical History:  Diagnosis Date  . Anxiety   . B12 deficiency   . BPH with elevated PSA   . BPH with obstruction/lower urinary tract symptoms   . Chronic low back pain   . COPD (chronic obstructive pulmonary disease) (HCC)   . Depression   . Diverticulitis   . Gout   . Hypertension   . Lumbar disc disease   . Renal insufficiency   . Sensory loss   . Sleep apnea   . Trigeminal neuralgia   . Urge incontinence     Medications:  Scheduled:  . heparin  4,000 Units Intravenous Once   Infusions:  . heparin    . levofloxacin (LEVAQUIN) IV 750 mg (05/16/17 0953)  . potassium chloride      Assessment: 83 yo M with Pulmonary embolism, ?sepsis, to start Heparin Drip. No anticoagulants noted PTA per Med Rec, just ASA. Hgb 12.8  Plt 223  INR 1.24   APTT pending  Goal of Therapy:  Heparin level 0.3-0.7 units/ml Monitor platelets by anticoagulation protocol: Yes   Plan:  Give 4000 units bolus x 1 Start heparin infusion at 1250 units/hr Check anti-Xa level in 8 hours and daily while on heparin Continue to monitor H&H and platelets    Lexus Shampine A 05/16/2017,11:07  AM

## 2017-05-16 NOTE — ED Notes (Signed)
EMS gave 125 of solu-medrol and 40mg  of lasix in route.

## 2017-05-16 NOTE — Progress Notes (Signed)
Pharmacy Antibiotic Note  Patricia Pesaaul T Burkhalter is a 83 y.o. male with a h/o CKD, COPD, and PPM admitted on 05/16/2017 with PE as well as  pneumonia and sepsis.  Pharmacy has been consulted for vancomycin and cefepime dosing.  Plan: Ke= 0.024 h-1 Vd= 51.8 L  Vancomycin 1000 mg iv q 36 hours with stacked dosing. Will check levels/adjsut dosing as indicated.   Cefepime 2 g iv q 24 hours.   Height: 5\' 10"  (177.8 cm) Weight: 165 lb (74.8 kg) IBW/kg (Calculated) : 73  No data recorded.  Recent Labs  Lab 05/16/17 0931 05/16/17 1214  WBC 16.6*  --   CREATININE 2.28*  --   LATICACIDVEN 2.5* 1.9    Estimated Creatinine Clearance: 24 mL/min (A) (by C-G formula based on SCr of 2.28 mg/dL (H)).    Allergies  Allergen Reactions  . Colchicine   . Doxycycline Other (See Comments)  . Meloxicam Other (See Comments)  . Sulfa Antibiotics     Other reaction(s): UNKNOWN  . Tussin  [Guaifenesin] Other (See Comments)  . Amoxicillin-Pot Clavulanate Rash    Antimicrobials this admission: Aztreonam and Levaquin 12/11 x 1 vancomycin 12/11 >>  Cefepime 12/11 >>  Dose adjustments this admission:   Microbiology results: 12/11 BCx: sent 12/11 MRSA PCR: sent  Thank you for allowing pharmacy to be a part of this patient's care.  Luisa HartChristy, Woodard Perrell D 05/16/2017 1:28 PM

## 2017-05-16 NOTE — Progress Notes (Signed)
Pt was transported to CCU from the ED while on the bipap. 

## 2017-05-16 NOTE — Consult Note (Signed)
[facility] Vascular Consult Note  MRN : [id number]  Ronald Weber is a 83 y.o. (03/24/1929) male who presents with chief complaint of  Chief Complaint  Patient presents with  . Respiratory Distress  .  History of Present Illness: I am asked to evaluate the patient by Dr. Allena KatzPatel.  He is he is an 83 year old gentleman who was admitted to the intensive care unit today secondary to sepsis from bilateral lower lobe pneumonia.  During his evaluation in the emergency room he was found to have swelling of the left lower extremity duplex ultrasound revealed extensive DVT.  Also identified on his CT scan of the chest was a pulmonary emboli in the upper lobe.  I have personally reviewed the scan and this does not appear to be extensive rather it is quite small.  He has been initiated on a heparin drip which he is tolerating without difficulty.  The patient denies leg pain or foot pain.  He denies past history of bleeding clotting disorders.  Since admission to the ICU and initiation of antibiotics he is improved.  He is now off his BiPAP.  He is able to answer my questions with minimal respiratory difficulty.  Current Facility-Administered Medications  Medication Dose Route Frequency Provider Last Rate Last Dose  . acetaminophen (TYLENOL) tablet 650 mg  650 mg Oral Q6H PRN Enedina FinnerPatel, Sona, MD       Or  . acetaminophen (TYLENOL) suppository 650 mg  650 mg Rectal Q6H PRN Enedina FinnerPatel, Sona, MD      . aspirin EC tablet 81 mg  81 mg Oral Daily Enedina FinnerPatel, Sona, MD   81 mg at 05/16/17 1352  . budesonide (PULMICORT) nebulizer solution 0.25 mg  0.25 mg Nebulization Q12H Tukov, Magadalene S, NP      . ceFEPIme (MAXIPIME) 2 g in dextrose 5 % 50 mL IVPB  2 g Intravenous q1800 Valentina GuChristy, Scott D, RPH   Stopped at 05/16/17 1529  . fesoterodine (TOVIAZ) tablet 4 mg  4 mg Oral Daily Enedina FinnerPatel, Sona, MD   4 mg at 05/16/17 1612  . furosemide (LASIX) tablet 20 mg  20 mg Oral Daily Enedina FinnerPatel, Sona, MD   20 mg at 05/16/17  1352  . heparin ADULT infusion 100 units/mL (25000 units/21550mL sodium chloride 0.45%)  1,250 Units/hr Intravenous Continuous Governor RooksLord, Rebecca, MD 12.5 mL/hr at 05/16/17 1700 1,250 Units/hr at 05/16/17 1700  . ipratropium-albuterol (DUONEB) 0.5-2.5 (3) MG/3ML nebulizer solution 3 mL  3 mL Nebulization Q6H Tukov, Magadalene S, NP   3 mL at 05/16/17 1413  . latanoprost (XALATAN) 0.005 % ophthalmic solution 1 drop  1 drop Both Eyes QHS Enedina FinnerPatel, Sona, MD      . Melene Muller[START ON 05/17/2017] lidocaine (LIDODERM) 5 % 1 patch  1 patch Transdermal Q24H Enedina FinnerPatel, Sona, MD      . magnesium sulfate IVPB 4 g 100 mL  4 g Intravenous Once Marylou Flesherukov, Magadalene S, NP 50 mL/hr at 05/16/17 1613 4 g at 05/16/17 1613  . morphine 2 MG/ML injection 2 mg  2 mg Intravenous Q3H PRN Tukov, Magadalene S, NP      . ondansetron (ZOFRAN) tablet 4 mg  4 mg Oral Q6H PRN Enedina FinnerPatel, Sona, MD       Or  . ondansetron (ZOFRAN) injection 4 mg  4 mg Intravenous Q6H PRN Enedina FinnerPatel, Sona, MD      . polyethylene glycol (MIRALAX / GLYCOLAX) packet 17 g  17 g Oral Daily PRN Enedina FinnerPatel, Sona, MD      .  sertraline (ZOLOFT) tablet 25 mg  25 mg Oral Daily Enedina Finner, MD   25 mg at 05/16/17 1352  . vancomycin (VANCOCIN) IVPB 1000 mg/200 mL premix  1,000 mg Intravenous Q36H Valentina Gu, RPH 200 mL/hr at 05/16/17 1714 1,000 mg at 05/16/17 1714    Past Medical History:  Diagnosis Date  . Anxiety   . Atrioventricular block    s/p pacemaker placement  . B12 deficiency   . BPH with elevated PSA   . BPH with obstruction/lower urinary tract symptoms   . Chronic low back pain   . COPD (chronic obstructive pulmonary disease) (HCC)   . Depression   . Diverticulitis   . Gout   . Hypertension   . Lumbar disc disease   . Renal insufficiency   . Sensory loss   . Sleep apnea   . Trigeminal neuralgia   . Urge incontinence     Past Surgical History:  Procedure Laterality Date  . APPENDECTOMY    . CATARACT EXTRACTION    . ESOPHAGOGASTRODUODENOSCOPY    . FUNCTIONAL  ENDOSCOPIC SINUS SURGERY    . HERNIA REPAIR    . PACEMAKER INSERTION      Social History Social History   Tobacco Use  . Smoking status: Former Smoker    Types: Cigars    Last attempt to quit: 04/08/2017    Years since quitting: 0.1  . Smokeless tobacco: Never Used  . Tobacco comment: quit 10 years  Substance Use Topics  . Alcohol use: Yes    Alcohol/week: 0.0 oz    Comment: occ  . Drug use: No    Family History Family History  Problem Relation Age of Onset  . Hematuria Neg Hx   . Kidney cancer Neg Hx   . Prostate cancer Neg Hx   . Bladder Cancer Neg Hx   No family history of bleeding/clotting disorders, porphyria or autoimmune disease   Allergies  Allergen Reactions  . Colchicine   . Doxycycline Other (See Comments)  . Meloxicam Other (See Comments)  . Sulfa Antibiotics     Other reaction(s): UNKNOWN  . Tussin  [Guaifenesin] Other (See Comments)  . Amoxicillin-Pot Clavulanate Rash     REVIEW OF SYSTEMS (Negative unless checked)  Constitutional: [] Weight loss  [] Fever  [] Chills Cardiac: [] Chest pain   [] Chest pressure   [] Palpitations   [] Shortness of breath when laying flat   [x] Shortness of breath at rest   [x] Shortness of breath with exertion. Vascular:  [] Pain in legs with walking   [] Pain in legs at rest   [] Pain in legs when laying flat   [] Claudication   [] Pain in feet when walking  [] Pain in feet at rest  [] Pain in feet when laying flat   [x] History of DVT   [] Phlebitis   [x] Swelling in legs   [] Varicose veins   [] Non-healing ulcers Pulmonary:   [] Uses home oxygen   [x] Productive cough   [] Hemoptysis   [] Wheeze  [x] COPD   [] Asthma Neurologic:  [] Dizziness  [] Blackouts   [] Seizures   [] History of stroke   [] History of TIA  [] Aphasia   [] Temporary blindness   [] Dysphagia   [] Weakness or numbness in arms   [] Weakness or numbness in legs Musculoskeletal:  [] Arthritis   [] Joint swelling   [] Joint pain   [] Low back pain Hematologic:  [] Easy bruising  [] Easy  bleeding   [] Hypercoagulable state   [] Anemic  [] Hepatitis Gastrointestinal:  [] Blood in stool   [] Vomiting blood  [] Gastroesophageal reflux/heartburn   [] Difficulty  swallowing. Genitourinary:  [x] Chronic kidney disease   [] Difficult urination  [] Frequent urination  [] Burning with urination   [] Blood in urine Skin:  [] Rashes   [] Ulcers   [] Wounds Psychological:  [] History of anxiety   []  History of major depression.  Physical Examination  Vitals:   05/16/17 1500 05/16/17 1505 05/16/17 1600 05/16/17 1700  BP: (!) 76/37 (!) 84/72 (!) 89/68 94/81  Pulse:  77 85 78  Resp: (!) 23 (!) 24 (!) 26 19  Temp:   98.3 F (36.8 C)   TempSrc:   Oral   SpO2:  (!) 80% 96% 100%  Weight:      Height:       Body mass index is 23.68 kg/m.  Head: [place]/AT, No temporalis wasting. Prominent temp pulse not noted. Ear/Nose/Throat: Nares w/o erythema or drainage, oropharynx w/o obstruction Eyes: PER, Sclera nonicteric.  Neck: Supple, no nuchal rigidity.  No JVD.  Pulmonary:  Breath sounds diminished with extensive rhonchi bilaterally, no use of accessory muscles.  Cardiac: RRR, normal S1, S2, no Murmurs,  Vascular: Left leg is swollen with 4+ pitting edema.  There are bilateral 2+ palpable popliteal pulses.  There are triphasic dorsalis pedis signals with Doppler bilaterally.  There are biphasic posterior tibial Doppler signals bilaterally. Gastrointestinal: soft, non-tender, non-distended.  Musculoskeletal:  No deformity or atrophy.  4+ left lower extremity edema. Neurologic: CN 2-12 intact. Symmetrical.  Speech is fluent.  Psychiatric: Judgment intact, Mood & affect appropriate for pt's clinical situation. Dermatologic: No rashes or ulcers noted.  No cellulitis or open wounds.     CBC Lab Results  Component Value Date   WBC 16.6 (H) 05/16/2017   HGB 12.8 (L) 05/16/2017   HCT 39.0 (L) 05/16/2017   MCV 88.2 05/16/2017   PLT 223 05/16/2017    BMET    Component Value Date/Time   NA 139 05/16/2017  0931   NA 140 01/31/2013 0505   K 2.7 (LL) 05/16/2017 0931   K 4.2 01/31/2013 0505   CL 121 (H) 05/16/2017 0931   CL 107 01/31/2013 0505   CO2 9 (L) 05/16/2017 0931   CO2 27 01/31/2013 0505   GLUCOSE 56 (L) 05/16/2017 0931   GLUCOSE 85 01/31/2013 0505   BUN 34 (H) 05/16/2017 0931   BUN 17 01/31/2013 0505   CREATININE 2.28 (H) 05/16/2017 0931   CREATININE 1.36 (H) 01/31/2013 0505   CALCIUM <4.0 (LL) 05/16/2017 0931   CALCIUM 9.1 01/31/2013 0505   GFRNONAA 24 (L) 05/16/2017 0931   GFRNONAA 48 (L) 01/31/2013 0505   GFRAA 28 (L) 05/16/2017 0931   GFRAA 56 (L) 01/31/2013 0505   Estimated Creatinine Clearance: 24 mL/min (A) (by C-G formula based on SCr of 2.28 mg/dL (H)).  COAG Lab Results  Component Value Date   INR 1.24 05/16/2017   INR 1.0 01/30/2013    Radiology CT of the chest with contrast is reviewed by myself and I am in agreement with the radiologist's interpretation bilateral pneumonias noted small left lung PE.   Assessment/Plan 1.  DVT associated with PE: Currently the patient is tolerating a heparin drip.  His thrombus burden within his lungs appears to be quite minimal.  Given his overall condition associated with his pneumonias and sepsis thrombolysis of his lower extremity is not appropriate.  At the present time he maintains good Doppler signals is not having any pain and therefore is left lower extremity and foot is not threatened.  As long as he is tolerating his heparin drip I  do not see the indication for a filter at this time.  2.  Sepsis secondary to pneumonia: Currently patient is on antibiotic therapy he is receiving supplemental oxygen he is in the intensive care unit.  3.  Hypotension: Most likely related to his sepsis as his thrombus burden within his left lung is quite small and would not typically be associated with hemodynamic instability.  Treatment is as noted in #1 and #2.  4.  Chronic renal insufficiency: There is likely an acute component given  his sepsis and hypotension.  IV hydration will be continued nephrotoxic drugs will be avoided.  Antibiotics will be dosed appropriately.  5.  COPD: Patient will be maintained on his aerosol treatments pneumonia will be aggressively treated.   Levora DredgeGregory Shimshon Narula, MD  05/16/2017 5:50 PM

## 2017-05-16 NOTE — Consult Note (Signed)
PULMONARY / CRITICAL CARE MEDICINE   Name: Ronald Weber MRN: [id number] DOB: Sep 18, 1928    ADMISSION DATE:  05/16/2017   CONSULTATION DATE:  05/16/2017  REFERRING MD:  Dr. Allena Katz  REASON:  Acute respiratory failure 2/2 PE and HCAP  HISTORY OF PRESENT ILLNESS:   This is an 83 y/o male with a PMH as indicated below who presented to the ED with acute respiratory distress x2 weeks. He was treated with levaquin and z-pack without any improvement. This morning his symptoms got worse hence EMS was called. He was hypoxic with SPO2 in the 70s and RR in the 40s. He was placed on BiPAP. His CT chest showed a left upper lobe PE and bilateral pneumonia.He is being admitted to the ICU for further management. Patient was recently hospitalized for acute respiratory failure, AKI, unwitnessed fall at home and rhabdomyolysis. He was discharged to rehab at [facility] where per family he was making good progress until his feet started swelling and he became more and more short of breath.   PAST MEDICAL HISTORY :  He  has a past medical history of Anxiety, B12 deficiency, BPH with elevated PSA, BPH with obstruction/lower urinary tract symptoms, Chronic low back pain, COPD (chronic obstructive pulmonary disease) (HCC), Depression, Diverticulitis, Gout, Hypertension, Lumbar disc disease, Renal insufficiency, Sensory loss, Sleep apnea, Trigeminal neuralgia, and Urge incontinence.  PAST SURGICAL HISTORY: He  has a past surgical history that includes Pacemaker insertion; Hernia repair; Appendectomy; Cataract extraction; Functional endoscopic sinus surgery; and Esophagogastroduodenoscopy.  Allergies  Allergen Reactions  . Colchicine   . Doxycycline Other (See Comments)  . Meloxicam Other (See Comments)  . Sulfa Antibiotics     Other reaction(s): UNKNOWN  . Tussin  [Guaifenesin] Other (See Comments)  . Amoxicillin-Pot Clavulanate Rash    No current facility-administered medications on file prior to encounter.     Current Outpatient Medications on File Prior to Encounter  Medication Sig  . aspirin EC 81 MG EC tablet Take 1 tablet (81 mg total) daily by mouth.  Marland Kitchen azithromycin (ZITHROMAX) 250 MG tablet Take one tab daily indefintely.  . budesonide-formoterol (SYMBICORT) 160-4.5 MCG/ACT inhaler Inhale 2 puffs into the lungs 2 (two) times daily.  Marland Kitchen docusate sodium (COLACE) 100 MG capsule Take 1 capsule (100 mg total) 2 (two) times daily by mouth. (Patient taking differently: Take 100 mg by mouth daily. )  . furosemide (LASIX) 20 MG tablet Take 1 tablet (20 mg total) daily by mouth.  . hydrOXYzine (ATARAX/VISTARIL) 25 MG tablet Take 25 mg by mouth at bedtime.   Marland Kitchen levofloxacin (LEVAQUIN) 500 MG tablet Take 500 mg by mouth daily.  Marland Kitchen lidocaine (LIDODERM) 5 % Place 1 patch onto the skin daily. Remove & Discard patch within 12 hours or as directed by MD  . losartan (COZAAR) 100 MG tablet Take 1 tablet (100 mg total) daily by mouth.  . metoprolol tartrate (LOPRESSOR) 25 MG tablet Take 1 tablet (25 mg total) 2 (two) times daily by mouth.  . sertraline (ZOLOFT) 25 MG tablet Take 25 mg by mouth daily.  Marland Kitchen tolterodine (DETROL LA) 4 MG 24 hr capsule Take 1 capsule (4 mg total) by mouth daily.  . TRAVATAN Z 0.004 % SOLN ophthalmic solution Place 1 drop into both eyes daily.  Marland Kitchen zolpidem (AMBIEN) 5 MG tablet Take 0.5 tablets (2.5 mg total) at bedtime as needed by mouth for sleep.  Marland Kitchen acetaminophen (TYLENOL) 325 MG tablet Take 2 tablets (650 mg total) every 6 (six) hours as needed  by mouth for mild pain or moderate pain.    FAMILY HISTORY:  His indicated that his mother is deceased. He indicated that his father is deceased. He indicated that the status of his neg hx is unknown.   SOCIAL HISTORY: He  reports that he quit smoking about 5 weeks ago. His smoking use included cigars. he has never used smokeless tobacco. He reports that he drinks alcohol. He reports that he does not use drugs.  REVIEW OF SYSTEMS:   Unable  to obtain as patient is currently on BiPAP  SUBJECTIVE:   VITAL SIGNS: BP (!) 86/74   Pulse 86   Resp (!) 22   Ht 5\' 10"  (1.778 m)   Wt 74.8 kg (165 lb)   SpO2 99%   BMI 23.68 kg/m   HEMODYNAMICS:    VENTILATOR SETTINGS:    INTAKE / OUTPUT: No intake/output data recorded.  PHYSICAL EXAMINATION: General: moderate-severe respiratory distress Neuro: awake, follows commands, moves all extremities HEENT: PERRLA, oral mucosa dry, neck is supple with no JVD Cardiovascular: AP regular, S1/S2, no mrg, +2 pulses, +3 pitting edema in LLE  Lungs: excessive use of accessory muscles, bilateral breath sounds with diffuse rhonchi in all lung fields, bibasilar rales and expiratory wheezes Abdomen: +BS, non-distended Musculoskeletal: +rom, no deformities Skin: warm and dry, mild cyanosis in LLE, multiple bruises in BLUE, right elbow with laceration, mild purulent drainage  LABS:  BMET Recent Labs  Lab 05/16/17 0931  NA 139  K 2.7*  CL 121*  CO2 9*  BUN 34*  CREATININE 2.28*  GLUCOSE 56*    Electrolytes Recent Labs  Lab 05/16/17 0931  CALCIUM <4.0*    CBC Recent Labs  Lab 05/16/17 0931  WBC 16.6*  HGB 12.8*  HCT 39.0*  PLT 223    Coag's Recent Labs  Lab 05/16/17 0931  APTT 36  INR 1.24    Sepsis Markers Recent Labs  Lab 05/16/17 0931 05/16/17 1214  LATICACIDVEN 2.5* 1.9    ABG No results for input(s): PHART, PCO2ART, PO2ART in the last 168 hours.  Liver Enzymes Recent Labs  Lab 05/16/17 0931  AST 71*  ALT 57  ALKPHOS 54  BILITOT 0.7  ALBUMIN <1.0*    Cardiac Enzymes No results for input(s): TROPONINI, PROBNP in the last 168 hours.  Glucose No results for input(s): GLUCAP in the last 168 hours.  Imaging Ct Angio Chest Pe W/cm &/or Wo Cm  Result Date: 05/16/2017 CLINICAL DATA:  Respiratory distress EXAM: CT ANGIOGRAPHY CHEST WITH CONTRAST TECHNIQUE: Multidetector CT imaging of the chest was performed using the standard protocol  during bolus administration of intravenous contrast. Multiplanar CT image reconstructions and MIPs were obtained to evaluate the vascular anatomy. CONTRAST:  75mL ISOVUE-370 IOPAMIDOL (ISOVUE-370) INJECTION 76% COMPARISON:  Chest x-ray earlier today.  Chest CT 11/21/2007 FINDINGS: Cardiovascular: Heart is normal size. Aorta is normal caliber. Scattered coronary artery and aortic calcifications. Pulmonary embolus noted within the left pulmonary artery extending into to segmental upper lobe branches, best seen on coronal images 48-52. No evidence of right heart strain Mediastinum/Nodes: No mediastinal, hilar, or axillary adenopathy. Lungs/Pleura: Airspace disease in both lower lobes concerning for pneumonia. Patchy airspace disease in the anterior right upper lobe and posterior lingula. Upper Abdomen: Multiple gallstones layering in the gallbladder. No acute findings. Musculoskeletal: Pacer in the left chest wall. No acute bony abnormality. Review of the MIP images confirms the above findings. IMPRESSION: Pulmonary emboli noted in the left upper lobe pulmonary artery extending into to  segmental upper lobe branches. Bilateral lower lobe airspace consolidation. Patchy opacities in both upper lobes. Findings concerning for multifocal pneumonia. Coronary artery disease, aortic atherosclerosis. Cholelithiasis. Electronically Signed   By: Charlett NoseKevin  Dover M.D.   On: 05/16/2017 10:32   Dg Chest Port 1 View  Result Date: 05/16/2017 CLINICAL DATA:  Respiratory distress.  Hypoxia.  COPD. EXAM: PORTABLE CHEST 1 VIEW COMPARISON:  04/12/2017 FINDINGS: There is a focal area of new increased density at the left lung base posterior medially. The lungs are otherwise clear. Heart size and vascularity are normal. Tortuosity and slight calcification of the thoracic aorta. No acute bone abnormality.  Pacemaker in place. IMPRESSION: Focal area of new consolidation at the left lung base posterior medially could represent atelectasis or  pneumonia. Aortic atherosclerosis. Electronically Signed   By: Francene BoyersJames  Maxwell M.D.   On: 05/16/2017 10:00   STUDIES:  BLLE dopplers:  Extensive acute occlusive left lower extremity DVT involving the iliac, femoral, popliteal, and calf veins.  Negative for right lower extremity DVT.  CULTURES: Blood cultures x 2  ANTIBIOTICS: 05/16/17 Vancomycin Cefepime  SIGNIFICANT EVENTS: 11/7>discharged 12/11>readmitted  LINES/TUBES: PIVs  DISCUSSION: 83 y/o male presenting with acute hypoxic respiratory failure, HCAP, sepsis, DVT and PE  ASSESSMENT  Acute hypoxic respiratory failure Sepsis secondary to HCAP HCAP Acute PE/DVT Hypokalemia Hypomagnesemia Hypocalcemia AKI-creatinine 2.28; baseline 1.5 Recent fall  Generalized debility History of: OSA, Hypertension. COPD and atrioventricular block s/p pacemaker  PLAN Continuous BiPAP and titrate off as needed Nebulized steroids and bronchodilator Abx as above F/U cultures Trend Procalcitonin Heparin gtt Monitor and replace electrolytes Trend creatinine  Palliative care consult for goals of care Social service consult for Rehab and LTC placement GI and DVT prophylaxis  FAMILY  - Updates: Family updated at bedside. Patient is a partial code-Intubate but no CPR  - Inter-disciplinary family meet or Palliative Care meeting due by:  day 7   Amour Trigg S. [facility] Center[facility] ANP-BC Pulmonary and Critical Care Medicine [facility] Pager [phone] or [phone]  05/16/2017, 1:16 PM

## 2017-05-16 NOTE — Progress Notes (Signed)
CODE SEPSIS - PHARMACY COMMUNICATION  **Broad Spectrum Antibiotics should be administered within 1 hour of Sepsis diagnosis**  Time Code Sepsis Called/Page Received: 1053  Antibiotics Ordered: 0945  Time of 1st antibiotic administration: 0954  Additional action taken by pharmacy:   If necessary, Name of Provider/Nurse Contacted:     Valentina Guhristy, Greenleigh Kauth D ,PharmD Clinical Pharmacist  05/16/2017  11:00 AM

## 2017-05-16 NOTE — Progress Notes (Addendum)
ANTICOAGULATION CONSULT NOTE - Initial Consult  Pharmacy Consult for Heparin drip Indication: pulmonary embolus  Allergies  Allergen Reactions  . Colchicine   . Doxycycline Other (See Comments)  . Meloxicam Other (See Comments)  . Sulfa Antibiotics     Other reaction(s): UNKNOWN  . Tussin  [Guaifenesin] Other (See Comments)  . Amoxicillin-Pot Clavulanate Rash    Patient Measurements: Height: 5\' 10"  (177.8 cm) Weight: 165 lb (74.8 kg) IBW/kg (Calculated) : 73 Heparin Dosing Weight: 74.8 kg   Vital Signs: Temp: 97.4 F (36.3 C) (12/11 2000) Temp Source: Axillary (12/11 2000) BP: 83/57 (12/11 2100) Pulse Rate: 81 (12/11 2100)  Labs: Recent Labs    05/16/17 0931 05/16/17 1300 05/16/17 1958  HGB 12.8*  --   --   HCT 39.0*  --   --   PLT 223  --   --   APTT 36  --   --   LABPROT 15.5*  --   --   INR 1.24  --   --   HEPARINUNFRC  --   --  0.58  CREATININE 2.28*  --  4.32*  TROPONINI  --  0.11*  --     Estimated Creatinine Clearance: 12.7 mL/min (A) (by C-G formula based on SCr of 4.32 mg/dL (H)).   Medical History: Past Medical History:  Diagnosis Date  . Anxiety   . Atrioventricular block    s/p pacemaker placement  . B12 deficiency   . BPH with elevated PSA   . BPH with obstruction/lower urinary tract symptoms   . Chronic low back pain   . COPD (chronic obstructive pulmonary disease) (HCC)   . Depression   . Diverticulitis   . Gout   . Hypertension   . Lumbar disc disease   . Renal insufficiency   . Sensory loss   . Sleep apnea   . Trigeminal neuralgia   . Urge incontinence     Medications:  Scheduled:  . acetylcysteine  4 mL Nebulization Q6H  . aspirin EC  81 mg Oral Daily  . budesonide (PULMICORT) nebulizer solution  0.25 mg Nebulization Q12H  . [START ON 05/17/2017] Chlorhexidine Gluconate Cloth  6 each Topical Q0600  . feeding supplement (ENSURE ENLIVE)  237 mL Oral TID BM  . fesoterodine  4 mg Oral Daily  . furosemide  20 mg Oral Daily   . ipratropium-albuterol  3 mL Nebulization Q6H  . latanoprost  1 drop Both Eyes QHS  . [START ON 05/17/2017] lidocaine  1 patch Transdermal Q24H  . mupirocin ointment  1 application Nasal BID  . sertraline  25 mg Oral Daily   Infusions:  . ceFEPime (MAXIPIME) IV Stopped (05/16/17 1529)  . heparin 1,250 Units/hr (05/16/17 1800)  . vancomycin Stopped (05/16/17 1820)    Assessment: 83 yo M with Pulmonary embolism, ?sepsis, to start Heparin Drip. No anticoagulants noted PTA per Med Rec, just ASA. Hgb 12.8  Plt 223  INR 1.24   APTT pending  Goal of Therapy:  Heparin level 0.3-0.7 units/ml Monitor platelets by anticoagulation protocol: Yes   Plan:  Give 4000 units bolus x 1 Start heparin infusion at 1250 units/hr Check anti-Xa level in 8 hours and daily while on heparin Continue to monitor H&H and platelets   12/11:  HL @ 20:00 = 0.58 Will continue this pt on current rate and recheck HL in 8 hrs on 12/12 @ 0400.   12/12 AM heparin level 0.56. Continue current regimen. Recheck heparin level and CBC with tomorrow  AM labs.  Fulton ReekMatt Lacheryl Niesen, PharmD, BCPS  05/17/17 5:59 AM

## 2017-05-16 NOTE — Progress Notes (Signed)
PT Cancellation Note  Patient Details Name: Ronald Weber MRN: [id number] DOB: May 28, 1929   Cancelled Treatment:    Reason Eval/Treat Not Completed: Medical issues which prohibited therapy(Consult received and chart reviewed.  Per chart review, patient noted with acute PE 05/16/17; anticoagulation (heparin drip) initiated 12/11 at 1115.  Per policy, will require 48 hours anticoagulation prior to initiation of exertional activity.  Will hold evaluation at this time and initiate as medically appropriate.)   Kole Hilyard H. Manson PasseyBrown, PT, DPT, NCS 05/16/17, 2:41 PM [phone]

## 2017-05-16 NOTE — ED Triage Notes (Signed)
Patient presents to ED via [facility] in respiratory distress from [place]. Patient arrives on c pap. EMS report rhoni heard at the doorway. SpO2 78%, RR 48 per EMS.

## 2017-05-16 NOTE — Progress Notes (Signed)
CH made initial visit on referral of RN. Pt was alert and being attended to by RN. Grandson and Granddaughter-in-law are bedside. CH informed the Pt of the spiritual care available 24/7 and is available for follow up if needed or desired.    05/16/17 1400  Clinical Encounter Type  Visited With Patient;Patient and family together  Visit Type Initial;Spiritual support  Referral From Nurse  Consult/Referral To Chaplain

## 2017-05-16 NOTE — H&P (Signed)
[facility] [place] at [facility]   PATIENT NAME: Ronald Weber    MR#:  [id number]  DATE OF BIRTH:  [birth year]  DATE OF ADMISSION:  05/16/2017  PRIMARY CARE PHYSICIAN: Lauro RegulusAnderson, Marshall W, MD   REQUESTING/REFERRING PHYSICIAN: Dr. Shaune PollackLord  CHIEF COMPLAINT:   Patient was brought in from assisted living with shortness of breath and low blood pressure HISTORY OF PRESENT ILLNESS:  Ronald Deedaul Filion  is a 83 y.o. male with a known history of anxiety depression, complete heart block status post pacemaker, recent fall with acute respiratory failure and traumatic rhabdomyolysis who was discharged recently from rehab to assisted living comes to the emergency room with increasing shortness of breath for 2 days with continuing cough and congestion.  Patient recently completed a course of Levaquin.  He was thereafter placed on Z-Pak.  In the ER patient was found to be hypotensive with blood pressure in the 60s cyanotic significant hypoxic with sats in the 70s-80s.  He was placed on BiPAP.  Sats during my evaluation was 100%.  Workup in the ER showed multifocal pneumonia with Pulmonary emboli noted in the left upper lobe pulmonary arteryextending into to segmental upper lobe branches on CT chest.  Patient's creatinine is up to 2.4.  His baseline is around 1.5.  He is being admitted with sepsis secondary to multifocal pneumonia and left upper lobe pulmonary emboli and septic shock.  Received IV Levaquin vancomycin and aztreonam in the ER.  He has been started on IV heparin drip.  He received a liter of fluids.  Spoke with son in the ER.  PAST MEDICAL HISTORY:   Past Medical History:  Diagnosis Date  . Anxiety   . B12 deficiency   . BPH with elevated PSA   . BPH with obstruction/lower urinary tract symptoms   . Chronic low back pain   . COPD (chronic obstructive pulmonary disease) (HCC)   . Depression   . Diverticulitis   . Gout   . Hypertension   . Lumbar disc disease   .  Renal insufficiency   . Sensory loss   . Sleep apnea   . Trigeminal neuralgia   . Urge incontinence     PAST SURGICAL HISTOIRY:   Past Surgical History:  Procedure Laterality Date  . APPENDECTOMY    . CATARACT EXTRACTION    . ESOPHAGOGASTRODUODENOSCOPY    . FUNCTIONAL ENDOSCOPIC SINUS SURGERY    . HERNIA REPAIR    . PACEMAKER INSERTION      SOCIAL HISTORY:   Social History   Tobacco Use  . Smoking status: Former Smoker    Types: Cigars    Last attempt to quit: 04/08/2017    Years since quitting: 0.1  . Smokeless tobacco: Never Used  . Tobacco comment: quit 10 years  Substance Use Topics  . Alcohol use: Yes    Alcohol/week: 0.0 oz    Comment: occ    FAMILY HISTORY:   Family History  Problem Relation Age of Onset  . Hematuria Neg Hx   . Kidney cancer Neg Hx   . Prostate cancer Neg Hx   . Bladder Cancer Neg Hx     DRUG ALLERGIES:   Allergies  Allergen Reactions  . Colchicine   . Doxycycline Other (See Comments)  . Meloxicam Other (See Comments)  . Sulfa Antibiotics     Other reaction(s): UNKNOWN  . Tussin  [Guaifenesin] Other (See Comments)  . Amoxicillin-Pot Clavulanate Rash    REVIEW OF SYSTEMS:  ROS  MEDICATIONS AT HOME:   Prior to Admission medications   Medication Sig Start Date End Date Taking? Authorizing Provider  aspirin EC 81 MG EC tablet Take 1 tablet (81 mg total) daily by mouth. 04/13/17  Yes Sudini, Wardell Heath, MD  azithromycin (ZITHROMAX) 250 MG tablet Take one tab daily indefintely. 05/10/17  Yes Shane Crutch, MD  budesonide-formoterol (SYMBICORT) 160-4.5 MCG/ACT inhaler Inhale 2 puffs into the lungs 2 (two) times daily.   Yes [provider]  docusate sodium (COLACE) 100 MG capsule Take 1 capsule (100 mg total) 2 (two) times daily by mouth. Patient taking differently: Take 100 mg by mouth daily.  04/12/17  Yes Sudini, Wardell Heath, MD  furosemide (LASIX) 20 MG tablet Take 1 tablet (20 mg total) daily by mouth. 04/12/17 04/12/18  Yes Sudini, Wardell Heath, MD  hydrOXYzine (ATARAX/VISTARIL) 25 MG tablet Take 25 mg by mouth at bedtime.  04/06/12  Yes [provider]  levofloxacin (LEVAQUIN) 500 MG tablet Take 500 mg by mouth daily.   Yes [provider]  lidocaine (LIDODERM) 5 % Place 1 patch onto the skin daily. Remove & Discard patch within 12 hours or as directed by MD   Yes [provider]  losartan (COZAAR) 100 MG tablet Take 1 tablet (100 mg total) daily by mouth. 04/13/17  Yes Milagros Loll, MD  metoprolol tartrate (LOPRESSOR) 25 MG tablet Take 1 tablet (25 mg total) 2 (two) times daily by mouth. 04/12/17  Yes Sudini, Wardell Heath, MD  sertraline (ZOLOFT) 25 MG tablet Take 25 mg by mouth daily.   Yes [provider]  tolterodine (DETROL LA) 4 MG 24 hr capsule Take 1 capsule (4 mg total) by mouth daily. 11/28/16  Yes MacDiarmid, Lorin Picket, MD  TRAVATAN Z 0.004 % SOLN ophthalmic solution Place 1 drop into both eyes daily. 03/10/17  Yes [provider]  zolpidem (AMBIEN) 5 MG tablet Take 0.5 tablets (2.5 mg total) at bedtime as needed by mouth for sleep. 04/12/17  Yes Milagros Loll, MD  acetaminophen (TYLENOL) 325 MG tablet Take 2 tablets (650 mg total) every 6 (six) hours as needed by mouth for mild pain or moderate pain. 04/12/17   Milagros Loll, MD      VITAL SIGNS:  Blood pressure (!) 97/55, pulse 95, resp. rate (!) 25, height  (1.778 m), weight 74.8 kg (165 lb), SpO2 99 %.  PHYSICAL EXAMINATION:  GENERAL:  83 y.o.-year-old patient lying in the bed with no acute distress.  EYES: Pupils equal, round, reactive to light and accommodation. No scleral icterus. Extraocular muscles intact.  HEENT: Head atraumatic, normocephalic. Oropharynx and nasopharynx clear.  NECK:  Supple, no jugular venous distention. No thyroid enlargement, no tenderness.  LUNGS: Normal breath sounds bilaterally, no wheezing, rales,rhonchi or crepitation. No use of accessory muscles of respiration.  CARDIOVASCULAR:  S1, S2 normal. No murmurs, rubs, or gallops.  ABDOMEN: Soft, nontender, nondistended. Bowel sounds present. No organomegaly or mass.  EXTREMITIES: No pedal edema, cyanosis, or clubbing.  NEUROLOGIC: Cranial nerves II through XII are intact. Muscle strength 5/5 in all extremities. Sensation intact. Gait not checked.  PSYCHIATRIC: The patient is alert and oriented x 3.  SKIN: No obvious rash, lesion, or ulcer.   LABORATORY PANEL:   CBC Recent Labs  Lab 05/16/17 0931  WBC 16.6*  HGB 12.8*  HCT 39.0*  PLT 223   ------------------------------------------------------------------------------------------------------------------  Chemistries  Recent Labs  Lab 05/16/17 0931  NA 139  K 2.7*  CL 121*  CO2 9*  GLUCOSE 56*  BUN  34*  CREATININE 2.28*  CALCIUM <4.0*  AST 71*  ALT 57  ALKPHOS 54  BILITOT 0.7   ------------------------------------------------------------------------------------------------------------------  Cardiac Enzymes No results for input(s): TROPONINI in the last 168 hours. ------------------------------------------------------------------------------------------------------------------  RADIOLOGY:  Ct Angio Chest Pe W/cm &/or Wo Cm  Result Date: 05/16/2017 CLINICAL DATA:  Respiratory distress EXAM: CT ANGIOGRAPHY CHEST WITH CONTRAST TECHNIQUE: Multidetector CT imaging of the chest was performed using the standard protocol during bolus administration of intravenous contrast. Multiplanar CT image reconstructions and MIPs were obtained to evaluate the vascular anatomy. CONTRAST:  75mL ISOVUE-370 IOPAMIDOL (ISOVUE-370) INJECTION 76% COMPARISON:  Chest x-ray earlier today.  Chest CT 11/21/2007 FINDINGS: Cardiovascular: Heart is normal size. Aorta is normal caliber. Scattered coronary artery and aortic calcifications. Pulmonary embolus noted within the left pulmonary artery extending into to segmental upper lobe branches, best seen on coronal images 48-52. No evidence  of right heart strain Mediastinum/Nodes: No mediastinal, hilar, or axillary adenopathy. Lungs/Pleura: Airspace disease in both lower lobes concerning for pneumonia. Patchy airspace disease in the anterior right upper lobe and posterior lingula. Upper Abdomen: Multiple gallstones layering in the gallbladder. No acute findings. Musculoskeletal: Pacer in the left chest wall. No acute bony abnormality. Review of the MIP images confirms the above findings. IMPRESSION: Pulmonary emboli noted in the left upper lobe pulmonary artery extending into to segmental upper lobe branches. Bilateral lower lobe airspace consolidation. Patchy opacities in both upper lobes. Findings concerning for multifocal pneumonia. Coronary artery disease, aortic atherosclerosis. Cholelithiasis. Electronically Signed   By: Charlett Nose M.D.   On: 05/16/2017 10:32   Dg Chest Port 1 View  Result Date: 05/16/2017 CLINICAL DATA:  Respiratory distress.  Hypoxia.  COPD. EXAM: PORTABLE CHEST 1 VIEW COMPARISON:  04/12/2017 FINDINGS: There is a focal area of new increased density at the left lung base posterior medially. The lungs are otherwise clear. Heart size and vascularity are normal. Tortuosity and slight calcification of the thoracic aorta. No acute bone abnormality.  Pacemaker in place. IMPRESSION: Focal area of new consolidation at the left lung base posterior medially could represent atelectasis or pneumonia. Aortic atherosclerosis. Electronically Signed   By: Francene Boyers M.D.   On: 05/16/2017 10:00    EKG:   Sinus tachycardia with paced rhythm IMPRESSION AND PLAN:   Mihail Prettyman  is a 83 y.o. male with a known history of anxiety depression, complete heart block status post pacemaker, recent fall with acute respiratory failure and traumatic rhabdomyolysis who was discharged recently from rehab to assisted living comes to the emergency room with increasing shortness of breath for 2 days with continuing cough and congestion.  1.   Septic shock secondary to multifocal pneumonia -Patient came in with hypotension tachycardia hypoxic elevated white count and CT chest consistent with multifocal pneumonia -Admit to I ICU stepdown -Spoke with attending Dr. Belia Heman -IV Vanco and Levaquin -Continue IV fluids.  Follow-up blood cultures sputum culture lactic acid levels  2.  Left upper lobe with extensive pulmonary emboli -IV heparin drip has been initiated -She has significant left lower extremity swelling.  Recently has been having limited mobility secondary to his recent fall and rehab.  Will get ultrasound of the bilateral lower extremity -Risks and benefits for heparin drip discussed with patient and son  3.  Hypotension secondary to 1 and 2 -Blood pressure improved from systolic 60-88 during my evaluation.  Consider pressors if needed while in ICU -Only getting IV fluids  4.  Acute on chronic kidney disease stage III -Prerenal  azotemia secondary to 1 and 2 -cont IV hydration, avoid nephrotoxins, consider nephrology consultation if needed.  5.  Hypokalemia pharmacy to replete  6. Severe hypocalcemia/severe hypoalbuminemia Iv calcium gluconate 1 gram now and then pharmacy to follow labs replete electrolytes  7.  DVT prophylaxis he is already on IV heparin drip  CODE STATUS discussed with patient patient's son.  Patient wants only intubation if required.  He denies CPR, cardioversion.    All the records are reviewed and case discussed with ED provider. Management plans discussed with the patient, family and they are in agreement.  CODE STATUS: Limited  TOTAL critical TIME TAKING CARE OF THIS PATIENT: *50* minutes.    Enedina Finner M.D on 05/16/2017 at 11:32 AM  Between 7am to 6pm - Pager - [phone]  After 6pm go to [email or web address] - password [email or web address] [facility]  [facility]  Office  [phone]  CC: Primary care physician; Lauro Regulus, MD

## 2017-05-16 NOTE — ED Provider Notes (Signed)
[name] Emergency Department Provider Note ____________________________________________   I have reviewed the triage vital signs and the triage nursing note.  HISTORY  Chief Complaint Respiratory Distress   Historian Patient and son, EMS report from Nursing home  HPI Ronald Weber is a 83 y.o. male presenting by EMS on BiPAP for acute respiratory distress and hypoxia.  Report is that the patient has been somewhat ill with shortness of breath for about 2 weeks, had completed a course of Levaquin and then because of persistent symptoms was placed recently on Z-Pak.  Patient had been at a nursing home facility until this past week when his "time ran out" and he was moved to [facility].  He has been not very mobile and had a fall within the past several weeks as well although there is no traumatic injury noted.  No known history of congestive heart failure, however his legs have been swollen especially left greater than right.  No reported fevers.  He has had the shortness of breath and mild cough without productive of sputum.  No vomiting or diarrhea in terms of volume or fluid dehydration status.   Past Medical History:  Diagnosis Date  . Anxiety   . B12 deficiency   . BPH with elevated PSA   . BPH with obstruction/lower urinary tract symptoms   . Chronic low back pain   . COPD (chronic obstructive pulmonary disease) (HCC)   . Depression   . Diverticulitis   . Gout   . Hypertension   . Lumbar disc disease   . Renal insufficiency   . Sensory loss   . Sleep apnea   . Trigeminal neuralgia   . Urge incontinence     Patient Active Problem List   Diagnosis Date Noted  . CAP (community acquired pneumonia) 04/08/2017  . Acute respiratory failure (HCC) 04/08/2017  . Rhabdomyolysis 04/08/2017  . Elevated troponin 04/08/2017  . Incontinence 08/20/2015  . BPH (benign prostatic hyperplasia) 08/20/2015  . Moderate episode of recurrent major  depressive disorder (HCC) 07/15/2015  . Peripheral vascular disease (HCC) 03/31/2015  . Derangement of knee 10/03/2014  . Acute gout 08/06/2014  . Acute gouty arthritis 08/06/2014  . B12 deficiency 04/07/2014  . Absence of sensation 04/07/2014  . Disordered sleep 04/07/2014  . Fothergill's neuralgia 04/07/2014  . DDD (degenerative disc disease), lumbar 10/11/2013  . Neuritis or radiculitis due to rupture of lumbar intervertebral disc 10/11/2013  . Degenerative arthritis of lumbar spine 10/11/2013  . CAFL (chronic airflow limitation) (HCC) 09/12/2013  . BP (high blood pressure) 09/12/2013  . Chronic obstructive pulmonary disease (HCC) 09/12/2013  . Artificial cardiac pacemaker 01/30/2013  . Benign prostatic hyperplasia with urinary obstruction 04/06/2012  . Benign fibroma of prostate 04/06/2012  . Abnormal prostate specific antigen 04/06/2012  . FOM (frequency of micturition) 04/06/2012  . Elevated prostate specific antigen (PSA) 04/06/2012    Past Surgical History:  Procedure Laterality Date  . APPENDECTOMY    . CATARACT EXTRACTION    . ESOPHAGOGASTRODUODENOSCOPY    . FUNCTIONAL ENDOSCOPIC SINUS SURGERY    . HERNIA REPAIR    . PACEMAKER INSERTION      Prior to Admission medications   Medication Sig Start Date End Date Taking? Authorizing Provider  aspirin EC 81 MG EC tablet Take 1 tablet (81 mg total) daily by mouth. 04/13/17  Yes Sudini, Wardell HeathSrikar, MD  azithromycin (ZITHROMAX) 250 MG tablet Take one tab daily indefintely. 05/10/17  Yes Shane Crutchamachandran, Pradeep, MD  budesonide-formoterol (SYMBICORT) 160-4.5  MCG/ACT inhaler Inhale 2 puffs into the lungs 2 (two) times daily.   Yes [provider]  docusate sodium (COLACE) 100 MG capsule Take 1 capsule (100 mg total) 2 (two) times daily by mouth. Patient taking differently: Take 100 mg by mouth daily.  04/12/17  Yes Sudini, Wardell HeathSrikar, MD  furosemide (LASIX) 20 MG tablet Take 1 tablet (20 mg total) daily by mouth. 04/12/17 04/12/18 Yes  Sudini, Wardell HeathSrikar, MD  hydrOXYzine (ATARAX/VISTARIL) 25 MG tablet Take 25 mg by mouth at bedtime.  04/06/12  Yes [provider]  levofloxacin (LEVAQUIN) 500 MG tablet Take 500 mg by mouth daily.   Yes [provider]  lidocaine (LIDODERM) 5 % Place 1 patch onto the skin daily. Remove & Discard patch within 12 hours or as directed by MD   Yes [provider]  losartan (COZAAR) 100 MG tablet Take 1 tablet (100 mg total) daily by mouth. 04/13/17  Yes Milagros LollSudini, Srikar, MD  metoprolol tartrate (LOPRESSOR) 25 MG tablet Take 1 tablet (25 mg total) 2 (two) times daily by mouth. 04/12/17  Yes Sudini, Wardell HeathSrikar, MD  sertraline (ZOLOFT) 25 MG tablet Take 25 mg by mouth daily.   Yes [provider]  tolterodine (DETROL LA) 4 MG 24 hr capsule Take 1 capsule (4 mg total) by mouth daily. 11/28/16  Yes MacDiarmid, Lorin PicketScott, MD  TRAVATAN Z 0.004 % SOLN ophthalmic solution Place 1 drop into both eyes daily. 03/10/17  Yes [provider]  zolpidem (AMBIEN) 5 MG tablet Take 0.5 tablets (2.5 mg total) at bedtime as needed by mouth for sleep. 04/12/17  Yes Milagros LollSudini, Srikar, MD  acetaminophen (TYLENOL) 325 MG tablet Take 2 tablets (650 mg total) every 6 (six) hours as needed by mouth for mild pain or moderate pain. 04/12/17   Milagros LollSudini, Srikar, MD  predniSONE (DELTASONE) 20 MG tablet Take 2 tablets (40 mg total) daily with breakfast by mouth. Patient not taking: Reported on 05/16/2017 04/12/17   Milagros LollSudini, Srikar, MD  traMADol (ULTRAM) 50 MG tablet Take 1 tablet (50 mg total) every 6 (six) hours as needed by mouth for severe pain. Patient not taking: Reported on 05/16/2017 04/12/17   Milagros LollSudini, Srikar, MD    Allergies  Allergen Reactions  . Colchicine   . Doxycycline Other (See Comments)  . Meloxicam Other (See Comments)  . Sulfa Antibiotics     Other reaction(s): UNKNOWN  . Tussin  [Guaifenesin] Other (See Comments)  . Amoxicillin-Pot Clavulanate Rash    Family History  Problem Relation Age of  Onset  . Hematuria Neg Hx   . Kidney cancer Neg Hx   . Prostate cancer Neg Hx   . Bladder Cancer Neg Hx     Social History Social History   Tobacco Use  . Smoking status: Former Smoker    Types: Cigars    Last attempt to quit: 04/08/2017    Years since quitting: 0.1  . Smokeless tobacco: Never Used  . Tobacco comment: quit 10 years  Substance Use Topics  . Alcohol use: Yes    Alcohol/week: 0.0 oz    Comment: occ  . Drug use: No    Review of Systems  Constitutional: Negative for fever. Eyes: Negative for visual changes. ENT: Negative for sore throat. Cardiovascular: Negative for chest pain. Respiratory: Positive for shortness of breath. Gastrointestinal: Negative for abdominal pain, vomiting and diarrhea. Genitourinary: Negative for dysuria. Musculoskeletal: Negative for back pain. Skin: Negative for rash. Neurological: Negative for headache.  ____________________________________________   PHYSICAL EXAM:  VITAL  SIGNS: ED Triage Vitals  Enc Vitals Group     BP 05/16/17 0930 (!) 154/128     Pulse Rate 05/16/17 0927 (!) 113     Resp 05/16/17 0930 (!) 30     Temp --      Temp src --      SpO2 05/16/17 0927 (!) 80 %     Weight 05/16/17 0927 165 lb (74.8 kg)     Height --      Head Circumference --      Peak Flow --      Pain Score --      Pain Loc --      Pain Edu? --      Excl. in GC? --      Constitutional: Alert and oriented.  On BiPAP, respiratory distress with retractions. HEENT   Head: Normocephalic and atraumatic.      Eyes: Conjunctivae are normal. Pupils equal and round.       Ears:         Nose: No congestion/rhinnorhea.   Mouth/Throat: Mucous membranes are mildly dry.   Neck: No stridor. Cardiovascular/Chest: Tachycardic, regular rhythm.  No murmurs, rubs, or gallops. Respiratory: Tachypneic, on BiPAP.  Subcostal retractions.  No clear wheezing.  Rhonchi/rales throughout.. Gastrointestinal: Soft. No distention, no guarding, no  rebound. Nontender.    Genitourinary/rectal:Deferred Musculoskeletal: Nontender with normal range of motion in all extremities.  No calf tenderness.  He does have 1+ lower extremity pitting edema right lower externally, 2 or 3+ lower extremity pitting edema in the left lower extremity. Neurologic: He seems a little bit somnolent, but he wakes up to voice easily and answers questions appropriately and normally.  Normal speech and language. No gross or focal neurologic deficits are appreciated. Skin:  Skin is warm, dry and intact. No rash noted. Psychiatric: Mood and affect are normal. Speech and behavior are normal. Patient exhibits appropriate insight and judgment.   ____________________________________________  LABS (pertinent positives/negatives) I, Governor Rooks, MD the attending physician have reviewed the labs noted below.  Labs Reviewed  COMPREHENSIVE METABOLIC PANEL - Abnormal; Notable for the following components:      Result Value   Potassium 2.7 (*)    Chloride 121 (*)    CO2 9 (*)    Glucose, Bld 56 (*)    BUN 34 (*)    Creatinine, Ser 2.28 (*)    Calcium <4.0 (*)    Total Protein <3.0 (*)    Albumin <1.0 (*)    AST 71 (*)    GFR calc non Af Amer 24 (*)    GFR calc Af Amer 28 (*)    All other components within normal limits  LACTIC ACID, PLASMA - Abnormal; Notable for the following components:   Lactic Acid, Venous 2.5 (*)    All other components within normal limits  CBC WITH DIFFERENTIAL/PLATELET - Abnormal; Notable for the following components:   WBC 16.6 (*)    Hemoglobin 12.8 (*)    HCT 39.0 (*)    RDW 15.2 (*)    Neutro Abs 13.9 (*)    Monocytes Absolute 1.3 (*)    All other components within normal limits  PROTIME-INR - Abnormal; Notable for the following components:   Prothrombin Time 15.5 (*)    All other components within normal limits  CULTURE, BLOOD (ROUTINE X 2)  CULTURE, BLOOD (ROUTINE X 2)  LACTIC ACID, PLASMA  URINALYSIS, COMPLETE (UACMP) WITH  MICROSCOPIC  APTT  HEPARIN LEVEL (UNFRACTIONATED)  ____________________________________________    EKG I, Governor Rooks, MD, the attending physician have personally viewed and interpreted all ECGs.  110bpm.  Tachycardia. atrially sensed and ventricularly paced rhythm. ____________________________________________  RADIOLOGY All Xrays were viewed by me.  Imaging interpreted by Radiologist, and I, Governor Rooks, MD the attending physician have reviewed the radiologist interpretation noted below.   CXR:  IMPRESSION: Focal area of new consolidation at the left lung base posterior medially could represent atelectasis or pneumonia.  Aortic atherosclerosis.   CT chest:  IMPRESSION: Pulmonary emboli noted in the left upper lobe pulmonary artery extending into to segmental upper lobe branches.  Bilateral lower lobe airspace consolidation. Patchy opacities in both upper lobes. Findings concerning for multifocal pneumonia.  Coronary artery disease, aortic atherosclerosis.  Cholelithiasis.   __________________________________________  PROCEDURES  Procedure(s) performed: None  Critical Care performed: CRITICAL CARE Performed by: Governor Rooks   Total critical care time: 60 minutes  Critical care time was exclusive of separately billable procedures and treating other patients.  Critical care was necessary to treat or prevent imminent or life-threatening deterioration.  Critical care was time spent personally by me on the following activities: development of treatment plan with patient and/or surrogate as well as nursing, discussions with consultants, evaluation of patient's response to treatment, examination of patient, obtaining history from patient or surrogate, ordering and performing treatments and interventions, ordering and review of laboratory studies, ordering and review of radiographic studies, pulse oximetry and re-evaluation of patient's  condition.    ____________________________________________  ED COURSE / ASSESSMENT AND PLAN  Pertinent labs & imaging results that were available during my care of the patient were reviewed by me and considered in my medical decision making (see chart for details).    Patient arrived profoundly hypoxic by EMS, placed on BiPAP with some improvement although even upon switching to our BiPAP on 100%, patient's O2 sat was still in the mid 80s.  Initial blood pressure was reported by EMS to be low in the 90s, repeat here was hypertensive, and then repeat was hypotensive.  Patient really not wheezing enough to make me think that his profound hypoxia was simply due to COPD exacerbation.  He does have COPD and was given Solu-Medrol by EMS.  He does have rhonchi/rales, but no history of CHF.  He does have lower extremity edema left greater than right without redness or tenderness in that left lower extremity.  Bedside portable x-ray showed no obvious pneumonia infiltrate, or pulmonary edema which was somewhat surprising, leaving pulmonary embolism as a much more concerning because of his profound hypoxia.  He was actually alert and oriented and able to have reasonable discussion, we chose to hold off on intubation and try to pursue diagnostic testing immediately so that he could be awake and alert to discuss future treatment options.  I had some significant concern that intubation would not immediately improve his oxygenation given the fact that he was alert and cooperating well with 100% BiPAP.  If low blood pressure was in fact due to cor pulmonale, I was concerned that he really may not even be stable for transfer for aerobic/surgical intervention type treatment such as thrombectomy and would want to have that conversation with the patient himself as well as the son who was in the room as well.  Patient stated that he would be open to intubation as a temporary measure if it was felt to be helpful  in the short-term.  However in case of heart stopping, he did request to  be DO NOT RESUSCITATE.  Patient did drop blood pressure into the 60s.  After x-ray did not show obvious pulmonary edema which actually was my initial first impression, I did start with IV fluids out of concern for possibility of sepsis.  After 2 L normal saline, blood pressure with map above 75, systolic blood pressure mid 90s.  Patient's color improved.  When he came back from CT scan, his oxygenation had actually improved into the 97% range on the 100% BiPAP.  CT scan did confirm left-sided PEs, as well as multifocal pneumonia.  Patient was already started on treatment for sepsis and placed on antibiotic coverage for unknown source initially, penicillin allergic protocol.  I did add the heparin per pharmacy consult for pulmonary embolism.  Given patient's overall improvement, and source considered to be PE plus the sepsis for multifocal pneumonia, hospitalist was consulted for ICU admission.  DIFFERENTIAL DIAGNOSIS: Includes but not limited to sepsis, pneumonia, congestive heart failure, COPD exacerbation, pneumothorax, acute MI, pulmonary embolus, etc.     CONSULTATIONS:   Dr. Allena Katz, hospitalist for admission.   Patient / Family / Caregiver informed of clinical course, medical decision-making process, and agree with plan.    ___________________________________________   FINAL CLINICAL IMPRESSION(S) / ED DIAGNOSES   Final diagnoses:  Other pulmonary embolism without acute cor pulmonale, unspecified chronicity (HCC)  Multifocal pneumonia  Hypoxia  Sepsis, due to unspecified organism (HCC)      ___________________________________________        Note: This dictation was prepared with Dragon dictation. Any transcriptional errors that result from this process are unintentional    Governor Rooks, MD 05/16/17 1123

## 2017-05-16 NOTE — Consult Note (Addendum)
Consultation Note Date: 05/16/2017   Patient Name: Ronald Weber  DOB: 12/01/1928  MRN: [id number]  Age / Sex: 83 y.o., male  PCP: Lauro RegulusAnderson, Marshall W, MD Referring Physician: Enedina FinnerPatel, Sona, MD  Reason for Consultation: Establishing goals of care  HPI/Patient Profile: Ronald Weber  is a 83 y.o. male with a known history of anxiety depression, complete heart block status post pacemaker, recent fall with acute respiratory failure and traumatic rhabdomyolysis who was discharged recently from rehab to assisted living facility. He currently has PNA, a LLE PE, and a DVT.     Clinical Assessment and Goals of Care: Patient lives alone at assisted living facility. His wife is deceased. He has 1 son. He states 1 month ago he was going to grocery store and going out to eat. He was treated for pneumonia and came to hospital.    We discussed diagnosis, prognosis, and GOC.  A detailed discussion was had today regarding advanced directives.  Concepts specific to code status, artifical feeding and hydration, and continued IV antibiotics were discussed.  The difference between an aggressive medical intervention path and a hospice comfort care path was discussed.     He states he wants chest compressions, shocks, and to be intubated to try to revive him. He states he would be willing to be placed on a ventilator, but not long term, and would not want a trach. He states his son is prepared to make decisions on withdrawing care as these decisions were discussed when his wife died.   He states he is unsure about dialysis or a feeding tube.     SUMMARY OF RECOMMENDATIONS   Continue current care. Does not want trach.   He is unsure about dialysis or feeding tube.   Code Status/Advance Care Planning:  Full code  Palliative Prophylaxis:   Aspiration  Additional Recommendations (Limitations, Scope, Preferences):  No  Tracheostomy   Prognosis:   < 6 months  Discharge Planning: To Be Determined      Primary Diagnoses: Present on Admission: . Sepsis (HCC)   I have reviewed the medical record, interviewed the patient and family, and examined the patient. The following aspects are pertinent.  Past Medical History:  Diagnosis Date  . Anxiety   . Atrioventricular block    s/p pacemaker placement  . B12 deficiency   . BPH with elevated PSA   . BPH with obstruction/lower urinary tract symptoms   . Chronic low back pain   . COPD (chronic obstructive pulmonary disease) (HCC)   . Depression   . Diverticulitis   . Gout   . Hypertension   . Lumbar disc disease   . Renal insufficiency   . Sensory loss   . Sleep apnea   . Trigeminal neuralgia   . Urge incontinence    Social History   Socioeconomic History  . Marital status: Widowed    Spouse name: None  . Number of children: None  . Years of education: None  . Highest education level: None  Social Needs  .  Financial resource strain: None  . Food insecurity - worry: None  . Food insecurity - inability: None  . Transportation needs - medical: None  . Transportation needs - non-medical: None  Occupational History  . None  Tobacco Use  . Smoking status: Former Smoker    Types: Cigars    Last attempt to quit: 04/08/2017    Years since quitting: 0.1  . Smokeless tobacco: Never Used  . Tobacco comment: quit 10 years  Substance and Sexual Activity  . Alcohol use: Yes    Alcohol/week: 0.0 oz    Comment: occ  . Drug use: No  . Sexual activity: None  Other Topics Concern  . None  Social History Narrative  . None   Family History  Problem Relation Age of Onset  . Hematuria Neg Hx   . Kidney cancer Neg Hx   . Prostate cancer Neg Hx   . Bladder Cancer Neg Hx    Scheduled Meds: . aspirin EC  81 mg Oral Daily  . budesonide (PULMICORT) nebulizer solution  0.25 mg Nebulization Q12H  . fesoterodine  4 mg Oral Daily  . furosemide   20 mg Oral Daily  . ipratropium-albuterol  3 mL Nebulization Q6H  . latanoprost  1 drop Both Eyes QHS  . [START ON 05/17/2017] lidocaine  1 patch Transdermal Q24H  . sertraline  25 mg Oral Daily   Continuous Infusions: . ceFEPime (MAXIPIME) IV 2 g (05/16/17 1453)  . heparin 1,250 Units/hr (05/16/17 1600)  . magnesium sulfate 1 - 4 g bolus IVPB    . vancomycin     PRN Meds:.acetaminophen **OR** acetaminophen, morphine injection, ondansetron **OR** ondansetron (ZOFRAN) IV, polyethylene glycol Medications Prior to Admission:  Prior to Admission medications   Medication Sig Start Date End Date Taking? Authorizing Provider  aspirin EC 81 MG EC tablet Take 1 tablet (81 mg total) daily by mouth. 04/13/17  Yes Sudini, Wardell HeathSrikar, MD  azithromycin (ZITHROMAX) 250 MG tablet Take one tab daily indefintely. 05/10/17  Yes Shane Crutchamachandran, Pradeep, MD  budesonide-formoterol (SYMBICORT) 160-4.5 MCG/ACT inhaler Inhale 2 puffs into the lungs 2 (two) times daily.   Yes [provider]  docusate sodium (COLACE) 100 MG capsule Take 1 capsule (100 mg total) 2 (two) times daily by mouth. Patient taking differently: Take 100 mg by mouth daily.  04/12/17  Yes Sudini, Wardell HeathSrikar, MD  furosemide (LASIX) 20 MG tablet Take 1 tablet (20 mg total) daily by mouth. 04/12/17 04/12/18 Yes Sudini, Wardell HeathSrikar, MD  hydrOXYzine (ATARAX/VISTARIL) 25 MG tablet Take 25 mg by mouth at bedtime.  04/06/12  Yes [provider]  levofloxacin (LEVAQUIN) 500 MG tablet Take 500 mg by mouth daily.   Yes [provider]  lidocaine (LIDODERM) 5 % Place 1 patch onto the skin daily. Remove & Discard patch within 12 hours or as directed by MD   Yes [provider]  losartan (COZAAR) 100 MG tablet Take 1 tablet (100 mg total) daily by mouth. 04/13/17  Yes Milagros LollSudini, Srikar, MD  metoprolol tartrate (LOPRESSOR) 25 MG tablet Take 1 tablet (25 mg total) 2 (two) times daily by mouth. 04/12/17  Yes Sudini, Wardell HeathSrikar, MD  sertraline (ZOLOFT) 25  MG tablet Take 25 mg by mouth daily.   Yes [provider]  tolterodine (DETROL LA) 4 MG 24 hr capsule Take 1 capsule (4 mg total) by mouth daily. 11/28/16  Yes MacDiarmid, Lorin PicketScott, MD  TRAVATAN Z 0.004 % SOLN ophthalmic solution Place 1 drop into both eyes daily. 03/10/17  Yes [provider]  zolpidem (AMBIEN) 5 MG tablet Take 0.5 tablets (2.5 mg total) at bedtime as needed by mouth for sleep. 04/12/17  Yes Milagros Loll, MD  acetaminophen (TYLENOL) 325 MG tablet Take 2 tablets (650 mg total) every 6 (six) hours as needed by mouth for mild pain or moderate pain. 04/12/17   Milagros Loll, MD   Allergies  Allergen Reactions  . Colchicine   . Doxycycline Other (See Comments)  . Meloxicam Other (See Comments)  . Sulfa Antibiotics     Other reaction(s): UNKNOWN  . Tussin  [Guaifenesin] Other (See Comments)  . Amoxicillin-Pot Clavulanate Rash   Review of Systems  Respiratory: Positive for shortness of breath.     Physical Exam  Constitutional: No distress.  Pulmonary/Chest:  BIPAP to [place] for GOC conversation.   Neurological: He is alert.  Oriented    Vital Signs: BP (!) 89/68   Pulse 85   Temp 98.1 F (36.7 C) (Axillary)   Resp (!) 26   Ht 5\' 10"  (1.778 m)   Wt 74.8 kg (165 lb)   SpO2 96%   BMI 23.68 kg/m  Pain Assessment: No/denies pain       SpO2: SpO2: 96 % O2 Device:SpO2: 96 % O2 Flow Rate: .O2 Flow Rate (L/min): 6 L/min  IO: Intake/output summary:   Intake/Output Summary (Last 24 hours) at 05/16/2017 1613 Last data filed at 05/16/2017 1600 Gross per 24 hour  Intake 2658.33 ml  Output -  Net 2658.33 ml    LBM:   Baseline Weight: Weight: 74.8 kg (165 lb) Most recent weight: Weight: 74.8 kg (165 lb)     Palliative Assessment/Data: 40%     Time In: 3:50 Time Out: 4:40 Time Total: 50 min Greater than 50%  of this time was spent counseling and coordinating care related to the above assessment and plan.  Signed by: Morton Stall, NP     Please contact Palliative Medicine Team phone at [phone] for questions and concerns.  For individual provider: See Loretha Stapler

## 2017-05-16 NOTE — ED Notes (Signed)
Code Sepsis called  1043

## 2017-05-16 NOTE — Progress Notes (Signed)
I was unable to doppler pedal pulses of any kind for both lower extremities. I recruited other nurses for their assistance with the doppler and no one could obtain pulses.

## 2017-05-17 ENCOUNTER — Inpatient Hospital Stay: Payer: Medicare Other

## 2017-05-17 DIAGNOSIS — N170 Acute kidney failure with tubular necrosis: Secondary | ICD-10-CM

## 2017-05-17 LAB — CBC
HCT: 37 % — ABNORMAL LOW (ref 40.0–52.0)
HEMOGLOBIN: 11.9 g/dL — AB (ref 13.0–18.0)
MCH: 29.3 pg (ref 26.0–34.0)
MCHC: 32.2 g/dL (ref 32.0–36.0)
MCV: 91 fL (ref 80.0–100.0)
PLATELETS: 191 10*3/uL (ref 150–440)
RBC: 4.07 MIL/uL — ABNORMAL LOW (ref 4.40–5.90)
RDW: 15.7 % — ABNORMAL HIGH (ref 11.5–14.5)
WBC: 10.7 10*3/uL — ABNORMAL HIGH (ref 3.8–10.6)

## 2017-05-17 LAB — POTASSIUM: Potassium: 6.3 mmol/L (ref 3.5–5.1)

## 2017-05-17 LAB — RENAL FUNCTION PANEL
ANION GAP: 10 (ref 5–15)
Albumin: 2.1 g/dL — ABNORMAL LOW (ref 3.5–5.0)
Albumin: 1.8 g/dL — ABNORMAL LOW (ref 3.5–5.0)
Albumin: 1.8 g/dL — ABNORMAL LOW (ref 3.5–5.0)
Albumin: 1.9 g/dL — ABNORMAL LOW (ref 3.5–5.0)
Anion gap: 15 (ref 5–15)
Anion gap: 12 (ref 5–15)
Anion gap: 11 (ref 5–15)
BUN: 62 mg/dL — AB (ref 6–20)
BUN: 63 mg/dL — ABNORMAL HIGH (ref 6–20)
BUN: 66 mg/dL — ABNORMAL HIGH (ref 6–20)
BUN: 45 mg/dL — ABNORMAL HIGH (ref 6–20)
CALCIUM: 8 mg/dL — AB (ref 8.9–10.3)
CHLORIDE: 102 mmol/L (ref 101–111)
CO2: 16 mmol/L — AB (ref 22–32)
CO2: 16 mmol/L — ABNORMAL LOW (ref 22–32)
CO2: 16 mmol/L — ABNORMAL LOW (ref 22–32)
CO2: 20 mmol/L — AB (ref 22–32)
CREATININE: 4.35 mg/dL — AB (ref 0.61–1.24)
Calcium: 7.8 mg/dL — ABNORMAL LOW (ref 8.9–10.3)
Calcium: 8.1 mg/dL — ABNORMAL LOW (ref 8.9–10.3)
Calcium: 8 mg/dL — ABNORMAL LOW (ref 8.9–10.3)
Chloride: 104 mmol/L (ref 101–111)
Chloride: 105 mmol/L (ref 101–111)
Chloride: 105 mmol/L (ref 101–111)
Creatinine, Ser: 4.37 mg/dL — ABNORMAL HIGH (ref 0.61–1.24)
Creatinine, Ser: 4.43 mg/dL — ABNORMAL HIGH (ref 0.61–1.24)
Creatinine, Ser: 2.77 mg/dL — ABNORMAL HIGH (ref 0.61–1.24)
GFR calc Af Amer: 13 mL/min — ABNORMAL LOW (ref >60)
GFR calc Af Amer: 13 mL/min — ABNORMAL LOW (ref >60)
GFR calc Af Amer: 22 mL/min — ABNORMAL LOW (ref >60)
GFR calc non Af Amer: 11 mL/min — ABNORMAL LOW (ref >60)
GFR calc non Af Amer: 11 mL/min — ABNORMAL LOW (ref >60)
GFR calc non Af Amer: 11 mL/min — ABNORMAL LOW (ref >60)
GFR calc non Af Amer: 19 mL/min — ABNORMAL LOW (ref >60)
GFR, EST AFRICAN AMERICAN: 13 mL/min — AB (ref >60)
GLUCOSE: 110 mg/dL — AB (ref 65–99)
GLUCOSE: 93 mg/dL (ref 65–99)
Glucose, Bld: 98 mg/dL (ref 65–99)
Glucose, Bld: 77 mg/dL (ref 65–99)
POTASSIUM: 5 mmol/L (ref 3.5–5.1)
Phosphorus: 6.5 mg/dL — ABNORMAL HIGH (ref 2.5–4.6)
Phosphorus: 6.6 mg/dL — ABNORMAL HIGH (ref 2.5–4.6)
Phosphorus: 6.6 mg/dL — ABNORMAL HIGH (ref 2.5–4.6)
Phosphorus: 4.6 mg/dL (ref 2.5–4.6)
Potassium: 6.1 mmol/L — ABNORMAL HIGH (ref 3.5–5.1)
Potassium: 6 mmol/L — ABNORMAL HIGH (ref 3.5–5.1)
Potassium: 5.6 mmol/L — ABNORMAL HIGH (ref 3.5–5.1)
SODIUM: 133 mmol/L — AB (ref 135–145)
SODIUM: 135 mmol/L (ref 135–145)
Sodium: 132 mmol/L — ABNORMAL LOW (ref 135–145)
Sodium: 132 mmol/L — ABNORMAL LOW (ref 135–145)

## 2017-05-17 LAB — HEPARIN LEVEL (UNFRACTIONATED)
Heparin Unfractionated: 0.56 IU/mL (ref 0.30–0.70)
Heparin Unfractionated: 0.56 IU/mL (ref 0.30–0.70)

## 2017-05-17 LAB — GLUCOSE, CAPILLARY: Glucose-Capillary: 70 mg/dL (ref 65–99)

## 2017-05-17 LAB — PROCALCITONIN: Procalcitonin: 6.89 ng/mL

## 2017-05-17 LAB — BASIC METABOLIC PANEL
Anion gap: 13 (ref 5–15)
BUN: 62 mg/dL — AB (ref 6–20)
CALCIUM: 8.1 mg/dL — AB (ref 8.9–10.3)
CO2: 17 mmol/L — AB (ref 22–32)
CREATININE: 4.36 mg/dL — AB (ref 0.61–1.24)
Chloride: 102 mmol/L (ref 101–111)
GFR calc non Af Amer: 11 mL/min — ABNORMAL LOW (ref >60)
GFR, EST AFRICAN AMERICAN: 13 mL/min — AB (ref >60)
GLUCOSE: 64 mg/dL — AB (ref 65–99)
Potassium: 6.2 mmol/L — ABNORMAL HIGH (ref 3.5–5.1)
Sodium: 132 mmol/L — ABNORMAL LOW (ref 135–145)

## 2017-05-17 LAB — MAGNESIUM
MAGNESIUM: 2.1 mg/dL (ref 1.7–2.4)
Magnesium: 2.7 mg/dL — ABNORMAL HIGH (ref 1.7–2.4)
Magnesium: 2.7 mg/dL — ABNORMAL HIGH (ref 1.7–2.4)
Magnesium: 2.8 mg/dL — ABNORMAL HIGH (ref 1.7–2.4)

## 2017-05-17 LAB — PREALBUMIN: Prealbumin: 5.1 mg/dL — ABNORMAL LOW (ref 18–38)

## 2017-05-17 MED ORDER — METOPROLOL TARTRATE 5 MG/5ML IV SOLN
INTRAVENOUS | Status: AC
  Filled 2017-05-17: qty 5

## 2017-05-17 MED ORDER — SODIUM POLYSTYRENE SULFONATE 15 GM/60ML PO SUSP
30.0000 g | ORAL | Status: DC
  Administered 2017-05-17: 30 g via ORAL
  Filled 2017-05-17 (×2): qty 120

## 2017-05-17 MED ORDER — SODIUM CHLORIDE 0.9 % IV BOLUS (SEPSIS)
1000.0000 mL | Freq: Once | INTRAVENOUS | Status: AC
  Administered 2017-05-17: 1000 mL via INTRAVENOUS

## 2017-05-17 MED ORDER — METOPROLOL TARTRATE 5 MG/5ML IV SOLN
INTRAVENOUS | Status: AC
  Administered 2017-05-17: 2.5 mg via INTRAVENOUS
  Filled 2017-05-17: qty 5

## 2017-05-17 MED ORDER — INSULIN REGULAR HUMAN 100 UNIT/ML IJ SOLN
10.0000 [IU] | Freq: Once | INTRAMUSCULAR | Status: AC
  Administered 2017-05-17: 10 [IU] via INTRAVENOUS
  Filled 2017-05-17: qty 0.1

## 2017-05-17 MED ORDER — IPRATROPIUM-ALBUTEROL 0.5-2.5 (3) MG/3ML IN SOLN
3.0000 mL | RESPIRATORY_TRACT | Status: DC
  Administered 2017-05-17 – 2017-05-18 (×4): 3 mL via RESPIRATORY_TRACT
  Filled 2017-05-17 (×4): qty 3

## 2017-05-17 MED ORDER — METOPROLOL TARTRATE 5 MG/5ML IV SOLN
2.5000 mg | INTRAVENOUS | Status: DC | PRN
  Administered 2017-05-17 (×2): 2.5 mg via INTRAVENOUS
  Filled 2017-05-17: qty 5

## 2017-05-17 MED ORDER — HEPARIN SODIUM (PORCINE) 1000 UNIT/ML DIALYSIS
1000.0000 [IU] | INTRAMUSCULAR | Status: DC | PRN
  Administered 2017-05-17: 2600 [IU] via INTRAVENOUS_CENTRAL
  Administered 2017-05-19: 3000 [IU] via INTRAVENOUS_CENTRAL
  Filled 2017-05-17: qty 3
  Filled 2017-05-17 (×3): qty 6

## 2017-05-17 MED ORDER — GERHARDT'S BUTT CREAM
TOPICAL_CREAM | Freq: Two times a day (BID) | CUTANEOUS | Status: DC
  Administered 2017-05-17 – 2017-05-19 (×4): via TOPICAL
  Filled 2017-05-17: qty 1

## 2017-05-17 MED ORDER — MORPHINE SULFATE (PF) 2 MG/ML IV SOLN
2.0000 mg | INTRAVENOUS | Status: DC | PRN
  Administered 2017-05-18 (×2): 1 mg via INTRAVENOUS
  Filled 2017-05-17: qty 1

## 2017-05-17 MED ORDER — ORAL CARE MOUTH RINSE
15.0000 mL | Freq: Two times a day (BID) | OROMUCOSAL | Status: DC
  Administered 2017-05-17 – 2017-05-18 (×3): 15 mL via OROMUCOSAL

## 2017-05-17 MED ORDER — OXYCODONE HCL 5 MG PO TABS
5.0000 mg | ORAL_TABLET | ORAL | Status: DC | PRN
  Administered 2017-05-17: 5 mg via ORAL
  Filled 2017-05-17: qty 1

## 2017-05-17 MED ORDER — RENA-VITE PO TABS
1.0000 | ORAL_TABLET | Freq: Every day | ORAL | Status: DC
  Administered 2017-05-17: 1 via ORAL
  Filled 2017-05-17: qty 1

## 2017-05-17 MED ORDER — METOPROLOL TARTRATE 5 MG/5ML IV SOLN
2.5000 mg | Freq: Once | INTRAVENOUS | Status: AC
  Administered 2017-05-17: 2.5 mg via INTRAVENOUS

## 2017-05-17 MED ORDER — DEXTROSE 50 % IV SOLN
50.0000 mL | Freq: Once | INTRAVENOUS | Status: AC
  Administered 2017-05-17: 50 mL via INTRAVENOUS
  Filled 2017-05-17: qty 50

## 2017-05-17 MED ORDER — DEXTROSE 50 % IV SOLN
25.0000 mL | Freq: Once | INTRAVENOUS | Status: AC
  Administered 2017-05-17: 25 mL via INTRAVENOUS
  Filled 2017-05-17: qty 50

## 2017-05-17 MED ORDER — SODIUM CHLORIDE 0.9 % IV BOLUS (SEPSIS)
500.0000 mL | Freq: Once | INTRAVENOUS | Status: AC
  Administered 2017-05-17: 500 mL via INTRAVENOUS

## 2017-05-17 MED ORDER — ALBUTEROL SULFATE (2.5 MG/3ML) 0.083% IN NEBU
2.5000 mg | INHALATION_SOLUTION | Freq: Four times a day (QID) | RESPIRATORY_TRACT | Status: DC
  Administered 2017-05-17 (×2): 2.5 mg via RESPIRATORY_TRACT
  Filled 2017-05-17 (×2): qty 3

## 2017-05-17 MED ORDER — DEXTROSE 5 % IV SOLN
1.0000 g | Freq: Three times a day (TID) | INTRAVENOUS | Status: DC
  Administered 2017-05-17 – 2017-05-19 (×5): 1 g via INTRAVENOUS
  Filled 2017-05-17 (×7): qty 1

## 2017-05-17 MED ORDER — SCOPOLAMINE 1 MG/3DAYS TD PT72
1.0000 | MEDICATED_PATCH | TRANSDERMAL | Status: DC
  Administered 2017-05-18: 1.5 mg via TRANSDERMAL
  Filled 2017-05-17: qty 1

## 2017-05-17 MED ORDER — POTASSIUM CHLORIDE 10 MEQ/100ML IV SOLN: 10.0000 meq | INTRAVENOUS | Status: DC

## 2017-05-17 MED ORDER — LIDOCAINE VISCOUS 2 % MT SOLN
15.0000 mL | OROMUCOSAL | Status: DC | PRN
  Administered 2017-05-17: 15 mL via OROMUCOSAL
  Filled 2017-05-17 (×3): qty 15

## 2017-05-17 MED ORDER — PUREFLOW DIALYSIS SOLUTION
INTRAVENOUS | Status: DC
  Administered 2017-05-17: 18:00:00 via INTRAVENOUS_CENTRAL
  Administered 2017-05-17 – 2017-05-18 (×2): 3 via INTRAVENOUS_CENTRAL

## 2017-05-17 MED ORDER — SODIUM CHLORIDE 0.9% FLUSH: 10.0000 mL | INTRAVENOUS | Status: DC | PRN

## 2017-05-17 MED ORDER — SODIUM CHLORIDE 0.9 % IV SOLN
1.0000 g | Freq: Once | INTRAVENOUS | Status: AC
  Administered 2017-05-17: 1 g via INTRAVENOUS
  Filled 2017-05-17: qty 10

## 2017-05-17 MED ORDER — VANCOMYCIN HCL IN DEXTROSE 1-5 GM/200ML-% IV SOLN
1000.0000 mg | INTRAVENOUS | Status: DC
  Administered 2017-05-17 – 2017-05-18 (×2): 1000 mg via INTRAVENOUS
  Filled 2017-05-17 (×3): qty 200

## 2017-05-17 MED ORDER — SODIUM CHLORIDE 0.9% FLUSH
10.0000 mL | Freq: Two times a day (BID) | INTRAVENOUS | Status: DC
  Administered 2017-05-17 – 2017-05-19 (×3): 10 mL

## 2017-05-17 NOTE — Care Management (Signed)
RNCM notified that [facility].

## 2017-05-17 NOTE — Progress Notes (Signed)
Ronald Weber with the IV team called to verify that newly inserted PICC line is in the correct position and ok for use.

## 2017-05-17 NOTE — Progress Notes (Signed)
Notified NP of latest potassium level of 6.2 as well as lab glucose level of 64.  Obtained glucose finger stick which resulted  70.  One cup of orange juice given to patient.  No new orders at this time.

## 2017-05-17 NOTE — Progress Notes (Signed)
After further assessment and evaluation patient has increased work of breathing hypoxia with PE and bilateral infiltrates most likely related to pulmonary edema in the setting of progressive renal failure with hyperkalemia I have discussed this with the patient and the son at bedside and they understand his clinical condition and at this time patient and son would want to further pursue hemodialysis and CRRT to attempt to correct his electrolyte imbalance as well his acute renal failure but patient does not want to undergo cardiopulmonary resuscitation and he does not want to be placed on a ventilator if needed so at this time patient will be a DNR/DNI.  Plan is to place a Vas-Cath and initiate CRRT and then place DNR DNI orders   Patient/Family are satisfied with Plan of action and management. All questions answered  Lucie LeatherKurian David Emanuell Morina, M.D.  [facility]  Medical Director [facility] Medical Director [facility]

## 2017-05-17 NOTE — Progress Notes (Signed)
ANTICOAGULATION CONSULT NOTE - Initial Consult  Pharmacy Consult for Heparin drip Indication: pulmonary embolus  Allergies  Allergen Reactions  . Colchicine   . Doxycycline Other (See Comments)  . Meloxicam Other (See Comments)  . Sulfa Antibiotics     Other reaction(s): UNKNOWN  . Tussin  [Guaifenesin] Other (See Comments)  . Amoxicillin-Pot Clavulanate Rash    Patient Measurements: Height: 5\' 10"  (177.8 cm) Weight: 164 lb 3.9 oz (74.5 kg) IBW/kg (Calculated) : 73 Heparin Dosing Weight: 74.8 kg   Vital Signs: Temp: 97 F (36.1 C) (12/12 2000) Temp Source: Axillary (12/12 2000) BP: 99/69 (12/12 2115) Pulse Rate: 92 (12/12 2015)  Labs: Recent Labs    05/16/17 0931 05/16/17 1300 05/16/17 1958 05/17/17 0443  05/17/17 1358 05/17/17 1719 05/17/17 2112  HGB 12.8*  --   --  11.9*  --   --   --   --   HCT 39.0*  --   --  37.0*  --   --   --   --   PLT 223  --   --  191  --   --   --   --   APTT 36  --   --   --   --   --   --   --   LABPROT 15.5*  --   --   --   --   --   --   --   INR 1.24  --   --   --   --   --   --   --   HEPARINUNFRC  --   --  0.58 0.56  --   --   --  0.56  CREATININE 2.28*  --  4.32* 4.36*   < > 4.37* 4.43* 2.77*  TROPONINI  --  0.11*  --   --   --   --   --   --    < > = values in this interval not displayed.    Estimated Creatinine Clearance: 19.8 mL/min (A) (by C-G formula based on SCr of 2.77 mg/dL (H)).   Medical History: Past Medical History:  Diagnosis Date  . Anxiety   . Atrioventricular block    s/p pacemaker placement  . B12 deficiency   . BPH with elevated PSA   . BPH with obstruction/lower urinary tract symptoms   . Chronic low back pain   . COPD (chronic obstructive pulmonary disease) (HCC)   . Depression   . Diverticulitis   . Gout   . Hypertension   . Lumbar disc disease   . Renal insufficiency   . Sensory loss   . Sleep apnea   . Trigeminal neuralgia   . Urge incontinence     Medications:  Scheduled:  .  acetylcysteine  4 mL Nebulization Q6H  . aspirin EC  81 mg Oral Daily  . budesonide (PULMICORT) nebulizer solution  0.25 mg Nebulization Q12H  . Chlorhexidine Gluconate Cloth  6 each Topical Q0600  . feeding supplement (ENSURE ENLIVE)  237 mL Oral TID BM  . fesoterodine  4 mg Oral Daily  . Gerhardt's butt cream   Topical BID  . ipratropium-albuterol  3 mL Nebulization Q4H  . latanoprost  1 drop Both Eyes QHS  . lidocaine  1 patch Transdermal Q24H  . mouth rinse  15 mL Mouth Rinse BID  . metoprolol tartrate      . multivitamin  1 tablet Oral QHS  . mupirocin ointment  1 application  Nasal BID  . sertraline  25 mg Oral Daily  . sodium chloride flush  10-40 mL Intracatheter Q12H  . sodium polystyrene  30 g Oral 1 day or 1 dose   Infusions:  . ceFEPime (MAXIPIME) IV 1 g (05/17/17 2159)  . heparin 1,250 Units/hr (05/17/17 1935)  . pureflow 2,500 mL/hr at 05/17/17 1750  . vancomycin Stopped (05/17/17 1935)    Assessment: 83 yo M with Pulmonary embolism, ?sepsis, to start Heparin Drip. 12/12 heparin paused for vascath placement.   Goal of Therapy:  Heparin level 0.3-0.7 units/ml Monitor platelets by anticoagulation protocol: Yes   Plan:  Heparin infusion resumed at previous rate of 1250 units/hr at 1330. Will recheck a HL at 2130.   12/12: HL @ 2112 = 0.56 Will continue this pt on current rate and recheck HL on 12/13 @ 0500.   Keaira Whitehurst D Clinical Pharmacist  05/17/17 10:12 PM

## 2017-05-17 NOTE — Consult Note (Signed)
[facility] CONSULT NOTE    Date: 05/17/2017                  Patient Name:  Ronald Weber  MRN: [id number]  DOB: 1928/06/14  Age / Sex: 83 y.o., male         PCP: Kirk Ruths, MD                 Service Requesting Consult: CCU                 Reason for Consult: Acute renal failure, CKD stage 3, hyperkalemia            History of Present Illness: Patient is a 83 y.o. male with a PMHx of anxiety, B12 deficiency, BPH, COPD, depression, hypertension, gout, chronic kidney disease stage III baseline creatinine 1.5 with an EGFR of 39, who was admitted to [facility] on 05/16/2017 for evaluation of respiratory distress.  Upon evaluation he did have a CT chest angiogram.  He was found to have pulmonary emboli.  He also has a left lower extremity DVT.  Patient has been hypotensive.  He also appears to have had recent rhabdomyolysis during his most recent admission.  His creatinine has now risen to 4.36 with a potassium of 6.2.  We were asked to see the patient by pulmonary/critical care.  Urine output over the preceding 24 hours was 750 cc.  He continues to have some hypotension now.  Patient is agreeable to having continuous renal placement therapy if necessary.  He also appears to have some element of metabolic acidosis.   Medications: Outpatient medications: Medications Prior to Admission  Medication Sig Dispense Refill Last Dose  . aspirin EC 81 MG EC tablet Take 1 tablet (81 mg total) daily by mouth.   05/15/2017 at 0800  . azithromycin (ZITHROMAX) 250 MG tablet Take one tab daily indefintely. 30 each 11 05/15/2017 at 1700  . budesonide-formoterol (SYMBICORT) 160-4.5 MCG/ACT inhaler Inhale 2 puffs into the lungs 2 (two) times daily.   05/15/2017 at 2000  . docusate sodium (COLACE) 100 MG capsule Take 1 capsule (100 mg total) 2 (two) times daily by mouth. (Patient taking differently: Take 100 mg by mouth daily. ) 10 capsule 0 05/15/2017 at 0800  . furosemide (LASIX)  20 MG tablet Take 1 tablet (20 mg total) daily by mouth. 30 tablet 11 05/15/2017 at 0800  . hydrOXYzine (ATARAX/VISTARIL) 25 MG tablet Take 25 mg by mouth at bedtime.    05/15/2017 at 2000  . levofloxacin (LEVAQUIN) 500 MG tablet Take 500 mg by mouth daily.   05/11/2017  . lidocaine (LIDODERM) 5 % Place 1 patch onto the skin daily. Remove & Discard patch within 12 hours or as directed by MD   05/16/2017 at Unknown time  . losartan (COZAAR) 100 MG tablet Take 1 tablet (100 mg total) daily by mouth.   05/15/2017 at 0800  . metoprolol tartrate (LOPRESSOR) 25 MG tablet Take 1 tablet (25 mg total) 2 (two) times daily by mouth.   05/15/2017 at 2000  . sertraline (ZOLOFT) 25 MG tablet Take 25 mg by mouth daily.   05/15/2017 at 0800  . tolterodine (DETROL LA) 4 MG 24 hr capsule Take 1 capsule (4 mg total) by mouth daily. 30 capsule 11 05/15/2017 at 0800  . TRAVATAN Z 0.004 % SOLN ophthalmic solution Place 1 drop into both eyes daily.   05/15/2017 at 0800  . zolpidem (AMBIEN) 5 MG tablet Take 0.5 tablets (  2.5 mg total) at bedtime as needed by mouth for sleep. 5 tablet 0 05/15/2017 at 2000  . acetaminophen (TYLENOL) 325 MG tablet Take 2 tablets (650 mg total) every 6 (six) hours as needed by mouth for mild pain or moderate pain.   prn at prn    Current medications: Current Facility-Administered Medications  Medication Dose Route Frequency Provider Last Rate Last Dose  . acetaminophen (TYLENOL) tablet 650 mg  650 mg Oral Q6H PRN Fritzi Mandes, MD   650 mg at 05/17/17 0515   Or  . acetaminophen (TYLENOL) suppository 650 mg  650 mg Rectal Q6H PRN Fritzi Mandes, MD      . acetylcysteine (MUCOMYST) 20 % nebulizer / oral solution 4 mL  4 mL Nebulization Q6H Varughese, [name] S, NP   4 mL at 05/17/17 0747  . albuterol (PROVENTIL) (2.5 MG/3ML) 0.083% nebulizer solution 2.5 mg  2.5 mg Nebulization Q6H Varughese, [name] S, NP   2.5 mg at 05/17/17 0747  . aspirin EC tablet 81 mg  81 mg Oral Daily Fritzi Mandes, MD   81 mg at  05/16/17 1352  . budesonide (PULMICORT) nebulizer solution 0.25 mg  0.25 mg Nebulization Q12H Tukov, Magadalene S, NP   0.25 mg at 05/17/17 0747  . ceFEPIme (MAXIPIME) 2 g in dextrose 5 % 50 mL IVPB  2 g Intravenous q1800 [facility]   Stopped at 05/16/17 1529  . Chlorhexidine Gluconate Cloth 2 % PADS 6 each  6 each Topical Q0600 Tukov, Magadalene S, NP      . feeding supplement (ENSURE ENLIVE) (ENSURE ENLIVE) liquid 237 mL  237 mL Oral TID BM Tukov, Magadalene S, NP      . fesoterodine (TOVIAZ) tablet 4 mg  4 mg Oral Daily Fritzi Mandes, MD   4 mg at 05/16/17 1612  . heparin ADULT infusion 100 units/mL (25000 units/254m sodium chloride 0.45%)  1,250 Units/hr Intravenous Continuous LLisa Roca MD 12.5 mL/hr at 05/17/17 0423 1,250 Units/hr at 05/17/17 0423  . latanoprost (XALATAN) 0.005 % ophthalmic solution 1 drop  1 drop Both Eyes QHS PFritzi Mandes MD   1 drop at 05/16/17 2257  . lidocaine (LIDODERM) 5 % 1 patch  1 patch Transdermal Q24H PFritzi Mandes MD      . MEDLINE mouth rinse  15 mL Mouth Rinse BID KFlora Lipps MD      . morphine 2 MG/ML injection 2 mg  2 mg Intravenous Q3H PRN TMikael Spray NP   2 mg at 05/17/17 0820  . mupirocin ointment (BACTROBAN) 2 % 1 application  1 application Nasal BID TMikael Spray NP   1 application at 138/10/172254  . ondansetron (ZOFRAN) tablet 4 mg  4 mg Oral Q6H PRN PFritzi Mandes MD       Or  . ondansetron ([facility] injection 4 mg  4 mg Intravenous Q6H PRN PFritzi Mandes MD      . polyethylene glycol (MIRALAX / GLYCOLAX) packet 17 g  17 g Oral Daily PRN PFritzi Mandes MD      . sertraline (ZOLOFT) tablet 25 mg  25 mg Oral Daily PFritzi Mandes MD   25 mg at 05/16/17 1352  . sodium polystyrene (KAYEXALATE) 15 GM/60ML suspension 30 g  30 g Oral 1 day or 1 dose Varughese, [name] S, NP   30 g at 05/17/17 0212  . vancomycin (VANCOCIN) IVPB 1000 mg/200 mL premix  1,000 mg Intravenous Q36H CNapoleon Form RPH   Stopped at 05/16/17 1820  Allergies: Allergies  Allergen Reactions  . Colchicine   . Doxycycline Other (See Comments)  . Meloxicam Other (See Comments)  . Sulfa Antibiotics     Other reaction(s): UNKNOWN  . Tussin  [Guaifenesin] Other (See Comments)  . Amoxicillin-Pot Clavulanate Rash      Past Medical History: Past Medical History:  Diagnosis Date  . Anxiety   . Atrioventricular block    s/p pacemaker placement  . B12 deficiency   . BPH with elevated PSA   . BPH with obstruction/lower urinary tract symptoms   . Chronic low back pain   . COPD (chronic obstructive pulmonary disease) ([place])   . Depression   . Diverticulitis   . Gout   . Hypertension   . Lumbar disc disease   . Renal insufficiency   . Sensory loss   . Sleep apnea   . Trigeminal neuralgia   . Urge incontinence      Past Surgical History: Past Surgical History:  Procedure Laterality Date  . APPENDECTOMY    . CATARACT EXTRACTION    . ESOPHAGOGASTRODUODENOSCOPY    . FUNCTIONAL ENDOSCOPIC SINUS SURGERY    . HERNIA REPAIR    . PACEMAKER INSERTION       Family History: Family History  Problem Relation Age of Onset  . Hematuria Neg Hx   . Kidney cancer Neg Hx   . Prostate cancer Neg Hx   . Bladder Cancer Neg Hx      Social History: Social History   Socioeconomic History  . Marital status: Widowed    Spouse name: Not on file  . Number of children: Not on file  . Years of education: Not on file  . Highest education level: Not on file  Social Needs  . Financial resource strain: Not on file  . Food insecurity - worry: Not on file  . Food insecurity - inability: Not on file  . Transportation needs - medical: Not on file  . Transportation needs - non-medical: Not on file  Occupational History  . Not on file  Tobacco Use  . Smoking status: Former Smoker    Types: Cigars    Last attempt to quit: 04/08/2017    Years since quitting: 0.1  . Smokeless tobacco: Never Used  . Tobacco comment: quit 10 years   Substance and Sexual Activity  . Alcohol use: Yes    Alcohol/week: 0.0 oz    Comment: occ  . Drug use: No  . Sexual activity: Not on file  Other Topics Concern  . Not on file  Social History Narrative  . Not on file     Review of Systems: Review of Systems  Constitutional: Positive for malaise/fatigue. Negative for chills and fever.  HENT: Negative for ear discharge, ear pain and hearing loss.   Eyes: Negative for blurred vision and double vision.  Respiratory: Positive for cough, sputum production and shortness of breath.   Cardiovascular: Positive for orthopnea and leg swelling. Negative for chest pain and palpitations.  Gastrointestinal: Negative for heartburn, nausea and vomiting.  Genitourinary: Negative for dysuria and urgency.  Musculoskeletal: Negative for back pain and myalgias.  Skin: Negative for itching and rash.  Neurological: Positive for weakness. Negative for dizziness and focal weakness.  Endo/Heme/Allergies: Negative for polydipsia. Does not bruise/bleed easily.  Psychiatric/Behavioral: Positive for depression. The patient is not nervous/anxious.      Vital Signs: Blood pressure (!) 87/61, pulse 99, temperature 97.7 F (36.5 C), temperature source Axillary, resp. rate 17, height '5\' 10"'$  (  1.778 m), weight 74.8 kg (165 lb), SpO2 93 %.  Weight trends: Filed Weights   05/16/17 0927  Weight: 74.8 kg (165 lb)    Physical Exam: General: Critically ill appearing  Head: Normocephalic, atraumatic.  Eyes: Anicteric, EOMI  Nose: Mucous membranes moist, not inflammed, nonerythematous.  Throat: Oropharynx nonerythematous, no exudate appreciated.   Neck: Supple, trachea midline.  Lungs:  Normal respiratory effort. Bilateral rales and rhonchi  Heart: S1S2, paced rhythm noted on monitor  Abdomen:  BS normoactive. Soft, Nondistended, non-tender.  No masses or organomegaly.  Extremities: L > R LE edema  Neurologic: A&O X3, Motor strength is 5/5 in the all 4  extremities  Skin: No visible rashes, scars.    Lab results: Basic Metabolic Panel: Recent Labs  Lab 05/16/17 0931 05/16/17 1300 05/16/17 1958 05/16/17 2126 05/16/17 2357 05/17/17 0443  NA 139  --  130*  --   --  132*  K 2.7*  --  6.6* 6.5* 6.3* 6.2*  CL 121*  --  101  --   --  102  CO2 9*  --  17*  --   --  17*  GLUCOSE 56*  --  137*  --   --  64*  BUN 34*  --  56*  --   --  62*  CREATININE 2.28*  --  4.32*  --   --  4.36*  CALCIUM <4.0*  --  7.9*  --   --  8.1*  MG  --  0.8* 2.8* 3.0*  --   --   PHOS  --  3.0  --   --   --   --     Liver Function Tests: Recent Labs  Lab 05/16/17 0931  AST 71*  ALT 57  ALKPHOS 54  BILITOT 0.7  PROT <3.0*  ALBUMIN <1.0*   No results for input(s): LIPASE, AMYLASE in the last 168 hours. No results for input(s): AMMONIA in the last 168 hours.  CBC: Recent Labs  Lab 05/16/17 0931 05/17/17 0443  WBC 16.6* 10.7*  NEUTROABS 13.9*  --   HGB 12.8* 11.9*  HCT 39.0* 37.0*  MCV 88.2 91.0  PLT 223 191    Cardiac Enzymes: Recent Labs  Lab 05/16/17 1300  TROPONINI 0.11*    BNP: Invalid input(s): POCBNP  CBG: Recent Labs  Lab 05/16/17 1243 05/17/17 0604  GLUCAP 113* 49    Microbiology: Results for orders placed or performed during the hospital encounter of 05/16/17  Culture, blood (Routine x 2)     Status: None (Preliminary result)   Collection Time: 05/16/17  9:29 AM  Result Value Ref Range Status   Specimen Description BLOOD LEFT FORARM  Final   Special Requests BOTTLES DRAWN AEROBIC AND ANAEROBIC BCHV  Final   Culture NO GROWTH < 24 HOURS  Final   Report Status PENDING  Incomplete  Culture, blood (Routine x 2)     Status: None (Preliminary result)   Collection Time: 05/16/17  9:29 AM  Result Value Ref Range Status   Specimen Description BLOOD RIGHT ARM  Final   Special Requests BOTTLES DRAWN AEROBIC AND ANAEROBIC  BCHV  Final   Culture NO GROWTH < 24 HOURS  Final   Report Status PENDING  Incomplete  MRSA PCR  Screening     Status: Abnormal   Collection Time: 05/16/17  1:18 PM  Result Value Ref Range Status   MRSA by PCR POSITIVE (A) NEGATIVE Final    Comment:  The GeneXpert MRSA Assay (FDA approved for NASAL specimens only), is one component of a comprehensive MRSA colonization surveillance program. It is not intended to diagnose MRSA infection nor to guide or monitor treatment for MRSA infections. RESULT CALLED TO, READ BACK BY AND VERIFIED WITH: JAKE ROBINSON 05/16/17 1731 KLW     Coagulation Studies: Recent Labs    05/16/17 0931  LABPROT 15.5*  INR 1.24    Urinalysis: Recent Labs    05/16/17 2029  COLORURINE YELLOW*  LABSPEC 1.020  PHURINE 5.0  GLUCOSEU NEGATIVE  HGBUR NEGATIVE  BILIRUBINUR NEGATIVE  KETONESUR NEGATIVE  PROTEINUR NEGATIVE  NITRITE NEGATIVE  LEUKOCYTESUR NEGATIVE      Imaging: Ct Angio Chest Pe W/cm &/or Wo Cm  Result Date: 05/16/2017 CLINICAL DATA:  Respiratory distress EXAM: CT ANGIOGRAPHY CHEST WITH CONTRAST TECHNIQUE: Multidetector CT imaging of the chest was performed using the standard protocol during bolus administration of intravenous contrast. Multiplanar CT image reconstructions and MIPs were obtained to evaluate the vascular anatomy. CONTRAST:  4m ISOVUE-370 IOPAMIDOL (ISOVUE-370) INJECTION 76% COMPARISON:  Chest x-ray earlier today.  Chest CT 11/21/2007 FINDINGS: Cardiovascular: Heart is normal size. Aorta is normal caliber. Scattered coronary artery and aortic calcifications. Pulmonary embolus noted within the left pulmonary artery extending into to segmental upper lobe branches, best seen on coronal images 48-52. No evidence of right heart strain Mediastinum/Nodes: No mediastinal, hilar, or axillary adenopathy. Lungs/Pleura: Airspace disease in both lower lobes concerning for pneumonia. Patchy airspace disease in the anterior right upper lobe and posterior lingula. Upper Abdomen: Multiple gallstones layering in the gallbladder. No  acute findings. Musculoskeletal: Pacer in the left chest wall. No acute bony abnormality. Review of the MIP images confirms the above findings. IMPRESSION: Pulmonary emboli noted in the left upper lobe pulmonary artery extending into to segmental upper lobe branches. Bilateral lower lobe airspace consolidation. Patchy opacities in both upper lobes. Findings concerning for multifocal pneumonia. Coronary artery disease, aortic atherosclerosis. Cholelithiasis. Electronically Signed   By: KRolm BaptiseM.D.   On: 05/16/2017 10:32   UKoreaVenous Img Lower Bilateral  Result Date: 05/16/2017 CLINICAL DATA:  Left leg swelling, shortness of breath, acute pulmonary emboli 1 EXAM: BILATERAL LOWER EXTREMITY VENOUS DOPPLER ULTRASOUND TECHNIQUE: Gray-scale sonography with graded compression, as well as color Doppler and duplex ultrasound were performed to evaluate the lower extremity deep venous systems from the level of the common femoral vein and including the common femoral, femoral, profunda femoral, popliteal and calf veins including the posterior tibial, peroneal and gastrocnemius veins when visible. The superficial great saphenous vein was also interrogated. Spectral Doppler was utilized to evaluate flow at rest and with distal augmentation maneuvers in the common femoral, femoral and popliteal veins. COMPARISON:  07/31/2016 FINDINGS: RIGHT LOWER EXTREMITY Common Femoral Vein: No evidence of thrombus. Normal compressibility, respiratory phasicity and response to augmentation. Saphenofemoral Junction: No evidence of thrombus. Normal compressibility and flow on color Doppler imaging. Profunda Femoral Vein: No evidence of thrombus. Normal compressibility and flow on color Doppler imaging. Femoral Vein: No evidence of thrombus. Normal compressibility, respiratory phasicity and response to augmentation. Popliteal Vein: No evidence of thrombus. Normal compressibility, respiratory phasicity and response to augmentation. Calf  Veins: Limited assessment of the calf veins. Superficial Great Saphenous Vein: No evidence of thrombus. Normal compressibility. Venous Reflux:  None. Other Findings:  None. LEFT LOWER EXTREMITY Common Femoral Vein: Acute hypoechoic intraluminal thrombus. Vessel is noncompressible. Femoral thrombus extends to the left iliac vein. Saphenofemoral Junction: Acute occlusive thrombus extends into the saphenous femoral  junction. Profunda Femoral Vein: Acute partially occlusive thrombus. Vessel this noncompressible. Minimal phasic flow detected. Femoral Vein: Acute hypoechoic occlusive thrombus throughout the femoral vein. Popliteal Vein: Acute hypoechoic nearly occlusive thrombus within the popliteal vein. Calf Veins: Hypoechoic thrombus appears to propagate into the calf peroneal and tibial veins. Superficial Great Saphenous Vein: Thrombus extends into the proximal left GSV. Venous Reflux:  None. Other Findings:  Subcutaneous edema noted. IMPRESSION: Extensive acute occlusive left lower extremity DVT involving the iliac, femoral, popliteal, and calf veins. Negative for right lower extremity DVT. These results will be called to the ordering clinician or representative by the Radiologist Assistant, and communication documented in the PACS or zVision Dashboard. Electronically Signed   By: Jerilynn Mages.  Shick M.D.   On: 05/16/2017 14:38   Dg Chest Port 1 View  Result Date: 05/16/2017 CLINICAL DATA:  Respiratory distress.  Hypoxia.  COPD. EXAM: PORTABLE CHEST 1 VIEW COMPARISON:  04/12/2017 FINDINGS: There is a focal area of new increased density at the left lung base posterior medially. The lungs are otherwise clear. Heart size and vascularity are normal. Tortuosity and slight calcification of the thoracic aorta. No acute bone abnormality.  Pacemaker in place. IMPRESSION: Focal area of new consolidation at the left lung base posterior medially could represent atelectasis or pneumonia. Aortic atherosclerosis. Electronically Signed    By: Lorriane Shire M.D.   On: 05/16/2017 10:00      Assessment & Plan: Pt is a 83 y.o. male with a PMHx of anxiety, B12 deficiency, BPH, COPD, depression, hypertension, gout, chronic kidney disease stage III baseline creatinine 1.5 with an EGFR of 39, who was admitted to [facility] on 05/16/2017 for evaluation of respiratory distress.  Upon evaluation he did have a CT chest angiogram.    1.  Acute renal failure/chronic kidney disease stage III baseline creatinine 1.5, EGFR 39.  Patient has known existing chronic kidney disease.  He had recent rhabdomyolysis as well.  However suspect now that his acute renal failure is related to hypotension as well as contrast exposure upon admission.  Patient made 750 cc of urine yesterday.  Creatinine quite high at 4.3 and potassium also high at 6.2 with also metabolic acidosis being present.  Therefore we recommend continuous renal placement therapy at this time.  This was discussed with pulmonary/critical care and they will proceed with dialysis catheter placement.  Thereafter we will start the patient on CRRT.  2.  Hyperkalemia.  Serum potassium 6.2.  Start the patient on a 2K bath with CRRT.  3.  Metabolic acidosis.  This should be treated with CRRT.  4.  Hypotension.  Continue to monitor blood pressure closely.  Keep a low threshold for initiation of pressors.  5.  Thanks for consultation.

## 2017-05-17 NOTE — Progress Notes (Signed)
PT Cancellation Note  Patient Details Name: Ronald Weber MRN: [id number] DOB: 1928/12/08   Cancelled Treatment:    Reason Eval/Treat Not Completed: Medical issues which prohibited therapy(Per chart, patient now with temporary dialysis catheter placed for initiation of CRRT.  Contraindicated for PT services/mobility while actively on CRRT.  Will complete orders at this time given current medical status.  Please re-consult as medically appropriate.)   Lucan Riner H. Manson PasseyBrown, PT, DPT, NCS 05/17/17, 3:40 PM [phone]

## 2017-05-17 NOTE — Procedures (Signed)
Central Venous Dailysis Catheter Placement: DOUBLE LUMEN  Indication: Hemo Dialysis/CRRT   Consent:emergent   Hand washing performed prior to starting the procedure.   Procedure: An active timeout was performed and correct patient, name, & ID confirmed. Patient was positioned correctly for central venous access. Patient was prepped using strict sterile technique including chlorohexadine preps, sterile drape, sterile gown and sterile gloves.  The area was prepped, draped and anesthetized in the usual sterile manner. Patient comfort was obtained.    A Double lumen catheter was placed in RT IJ  Vein There was good blood return, catheter caps were placed on lumens, catheter flushed easily, the line was secured and a sterile dressing and BIO-PATCH applied.   Ultrasound was used to visualize vasculature and guidance of needle.   Number of Attempts: 1 Complications:none  Estimated Blood Loss: none Operator: Tinie Mcgloin/Tukov.   Ronald Weber, M.D.  [facility]  Medical Director [facility] Medical Director [facility]

## 2017-05-17 NOTE — Progress Notes (Signed)
[facility] about low SBP, NP stated to run current 2 hour bolus over 1 hour for remainder of fluid amount. Will continue to monitor patient.

## 2017-05-17 NOTE — Progress Notes (Addendum)
Pharmacy Electrolyte Monitoring/CRRT Medication Adjustment Consult:  Pharmacy consulted to assist in monitoring and replacing electrolytes in this 83 y.o. male admitted on 05/16/2017 with Respiratory Distress Patient to begin CRRT 12/12.  Labs:  Sodium (mmol/L)  Date Value  05/17/2017 133 (L)  01/31/2013 140   Potassium (mmol/L)  Date Value  05/17/2017 6.1 (H)  01/31/2013 4.2   Magnesium (mg/dL)  Date Value  16/10/960412/05/2017 2.7 (H)  01/30/2013 1.8   Phosphorus (mg/dL)  Date Value  54/09/811912/05/2017 6.5 (H)   Calcium (mg/dL)  Date Value  14/78/295612/05/2017 8.0 (L)   Calcium, Total (mg/dL)  Date Value  21/30/865708/28/2014 9.1   Albumin (g/dL)  Date Value  84/69/629512/05/2017 2.1 (L)  01/30/2013 3.8    Plan: 1. Electrolytes do not require replacement at present. Will continue to monitor and replace electrolytes as indicated.  2. No medications other than antibiotics require adjustment for CRRT at present. Will continue to monitor and adjust as needed.   Luisa HartChristy, Jamyson Jirak D 05/17/2017 1:42 PM

## 2017-05-17 NOTE — Progress Notes (Signed)
Pharmacy Antibiotic Note  Ronald Weber is a 83 y.o. male with a h/o CKD, COPD, and PPM admitted on 05/16/2017 with PE as well as  pneumonia and sepsis.  Pharmacy has been consulted for vancomycin and cefepime dosing.  12/12: Patient started on CRRT.   Plan: Will transition patient to vancomycin 1 g iv q 24 hours and cefepime 1 g iv q 8 hours for CRRT. Will f/u plans for abx.   Height: 5\' 10"  (177.8 cm) Weight: 164 lb 3.9 oz (74.5 kg) IBW/kg (Calculated) : 73  Temp (24hrs), Avg:98.1 F (36.7 C), Min:97.4 F (36.3 C), Max:98.9 F (37.2 C)  Recent Labs  Lab 05/16/17 0931 05/16/17 1214 05/16/17 1958 05/17/17 0443 05/17/17 1130 05/17/17 1358  WBC 16.6*  --   --  10.7*  --   --   CREATININE 2.28*  --  4.32* 4.36* 4.35* 4.37*  LATICACIDVEN 2.5* 1.9  --   --   --   --     Estimated Creatinine Clearance: 12.5 mL/min (A) (by C-G formula based on SCr of 4.37 mg/dL (H)).    Allergies  Allergen Reactions  . Colchicine   . Doxycycline Other (See Comments)  . Meloxicam Other (See Comments)  . Sulfa Antibiotics     Other reaction(s): UNKNOWN  . Tussin  [Guaifenesin] Other (See Comments)  . Amoxicillin-Pot Clavulanate Rash    Antimicrobials this admission: Aztreonam and Levaquin 12/11 x 1 vancomycin 12/11 >>  Cefepime 12/11 >>  Dose adjustments this admission:   Microbiology results: 12/11 BCx: NGTD 12/11 MRSA PCR: positive  Thank you for allowing pharmacy to be a part of this patient's care.  Valentina GuChristy, Darnise Montag D 05/17/2017 3:37 PM

## 2017-05-17 NOTE — Progress Notes (Signed)
CRRT started. Last BMET drawn before initiation of CRRT. MD aware. Will continue to monitor patient.

## 2017-05-17 NOTE — Progress Notes (Signed)
Patient went into SVT after placement of vas cath NP Magdalene at bedside. NP ordered IV push metoprolol and instructed patient to cough to initiate the vagal maneuver. IV push metoprolol administered by this RN. Patients heart rate came down to 80's. BP low, NP still at bedside, ordered 500cc bolus. See new orders, all orders administered by this RN. Will continue to monitor patient.

## 2017-05-17 NOTE — Progress Notes (Signed)
Constant communication between this RN and NP Rebbeca PaulMagdalene and MD Kasa about need for central line for hypotension and blood sampling for laboratory orders due to lab technician unable to obtain enough blood sample.  NP Magdalene attempted EJ, unsuccessful. IV team attempted to place 18 gauge IV to use for only blood sampling in bilateral upper extremities, unsuccessful. After administering calcium gluconate and IV insulin to lower potassium levels, drew initial BMET off CRRT lines before CRRT initiated due to complications with machines. IV team spoke with NP Magdalene and discussed PICC line option.  NP Magdalene made aware of hypotension. Hypotension would resolve with fluid boluses and would start to drop again.  Will continue to monitor patient.

## 2017-05-17 NOTE — Progress Notes (Signed)
Peripherally Inserted Central Catheter/Midline Placement  The IV Nurse has discussed with the patient and/or persons authorized to consent for the patient, the purpose of this procedure and the potential benefits and risks involved with this procedure.  The benefits include less needle sticks, lab draws from the catheter, and the patient may be discharged home with the catheter. Risks include, but not limited to, infection, bleeding, blood clot (thrombus formation), and puncture of an artery; nerve damage and irregular heartbeat and possibility to perform a PICC exchange if needed/ordered by physician.  Alternatives to this procedure were also discussed.  Bard Power PICC patient education guide, fact sheet on infection prevention and patient information card has been provided to patient /or left at bedside.  Son at bedside and signed consent.  PICC/Midline Placement Documentation  PICC Single Lumen 05/17/17 PICC Right Brachial 40 cm 0 cm (Active)  Indication for Insertion or Continuance of Line Limited venous access - need for IV therapy >5 days (PICC only);Poor Vasculature-patient has had multiple peripheral attempts or PIVs lasting less than 24 hours 05/17/2017  6:00 PM  Exposed Catheter (cm) 0 cm 05/17/2017  6:00 PM  Site Assessment Clean;Dry;Intact 05/17/2017  6:00 PM  Line Status Flushed;Saline locked;Blood return noted 05/17/2017  6:00 PM  Dressing Type Transparent 05/17/2017  6:00 PM  Dressing Status Clean;Dry;Intact;Antimicrobial disc in place 05/17/2017  6:00 PM  Dressing Intervention New dressing 05/17/2017  6:00 PM  Dressing Change Due 05/24/17 05/17/2017  6:00 PM       Ethelda ChickCurrie, Kazi Reppond Robert 05/17/2017, 6:36 PM

## 2017-05-17 NOTE — Progress Notes (Addendum)
[employer] - [place] [facility]   PATIENT NAME: Ronald Weber    MR#:  [id number]  DATE OF BIRTH:  April 04, 1929  SUBJECTIVE:  CHIEF COMPLAINT:   Chief Complaint  Patient presents with  . Respiratory Distress   -admitted with pneumonia, new DVT and PE admitted with hypoxic respiratory failure and sepsis. -Still has significant gurgling secretions. Off BiPAP and now on 4 L nasal cannula -Worsening renal failure and hyperkalemia  REVIEW OF SYSTEMS:  Review of Systems  Constitutional: Positive for malaise/fatigue. Negative for chills and fever.  HENT: Negative for congestion, ear discharge, hearing loss and nosebleeds.   Eyes: Negative for blurred vision and double vision.  Respiratory: Positive for cough, shortness of breath and wheezing.   Cardiovascular: Negative for chest pain and palpitations.  Gastrointestinal: Negative for abdominal pain, constipation, diarrhea, nausea and vomiting.  Genitourinary: Negative for dysuria.  Musculoskeletal: Positive for myalgias.  Neurological: Negative for dizziness, speech change, focal weakness, seizures and headaches.  Psychiatric/Behavioral: Negative for depression.    DRUG ALLERGIES:   Allergies  Allergen Reactions  . Colchicine   . Doxycycline Other (See Comments)  . Meloxicam Other (See Comments)  . Sulfa Antibiotics     Other reaction(s): UNKNOWN  . Tussin  [Guaifenesin] Other (See Comments)  . Amoxicillin-Pot Clavulanate Rash    VITALS:  Blood pressure (!) 85/68, pulse 97, temperature 97.7 F (36.5 C), temperature source Axillary, resp. rate 20, height 5\' 10"  (1.778 m), weight 74.8 kg (165 lb), SpO2 92 %.  PHYSICAL EXAMINATION:  Physical Exam  GENERAL:  82 y.o.-year-old patient lying in the bed, gurgling upper airway secretions while talking.  EYES: Pupils equal, round, reactive to light and accommodation. No scleral icterus. Extraocular muscles intact.  HEENT: Head atraumatic, normocephalic.  Oropharynx and nasopharynx clear.  NECK:  Supple, no jugular venous distention. No thyroid enlargement, no tenderness.  LUNGS: gurgling upper airway secretions, however normal bilateral breath sounds.  no wheezing, rales,rhonchi or crepitation. No use of accessory muscles of respiration.  CARDIOVASCULAR: S1, S2 normal. Norubs, or gallops. 3/6 systolic murmur present ABDOMEN: Soft, nontender, nondistended. Bowel sounds present. No organomegaly or mass.  EXTREMITIES: No cyanosis, or clubbing. 2+ edema of the left lower extremity NEUROLOGIC: Cranial nerves II through XII are intact. Muscle strength 5/5 in all extremities. Sensation intact. Gait not checked. Global weakness noted PSYCHIATRIC: The patient is alert and oriented x 3.  SKIN: No obvious rash, lesion, or ulcer.    LABORATORY PANEL:   CBC Recent Labs  Lab 05/17/17 0443  WBC 10.7*  HGB 11.9*  HCT 37.0*  PLT 191   ------------------------------------------------------------------------------------------------------------------  Chemistries  Recent Labs  Lab 05/16/17 0931  05/16/17 2126  05/17/17 0443  NA 139   < >  --   --  132*  K 2.7*   < > 6.5*   < > 6.2*  CL 121*   < >  --   --  102  CO2 9*   < >  --   --  17*  GLUCOSE 56*   < >  --   --  64*  BUN 34*   < >  --   --  62*  CREATININE 2.28*   < >  --   --  4.36*  CALCIUM <4.0*   < >  --   --  8.1*  MG  --    < > 3.0*  --   --   AST 71*  --   --   --   --  ALT 57  --   --   --   --   ALKPHOS 54  --   --   --   --   BILITOT 0.7  --   --   --   --    < > = values in this interval not displayed.   ------------------------------------------------------------------------------------------------------------------  Cardiac Enzymes Recent Labs  Lab 05/16/17 1300  TROPONINI 0.11*   ------------------------------------------------------------------------------------------------------------------  RADIOLOGY:  Ct Angio Chest Pe W/cm &/or Wo Cm  Result Date:  05/16/2017 CLINICAL DATA:  Respiratory distress EXAM: CT ANGIOGRAPHY CHEST WITH CONTRAST TECHNIQUE: Multidetector CT imaging of the chest was performed using the standard protocol during bolus administration of intravenous contrast. Multiplanar CT image reconstructions and MIPs were obtained to evaluate the vascular anatomy. CONTRAST:  75mL ISOVUE-370 IOPAMIDOL (ISOVUE-370) INJECTION 76% COMPARISON:  Chest x-ray earlier today.  Chest CT 11/21/2007 FINDINGS: Cardiovascular: Heart is normal size. Aorta is normal caliber. Scattered coronary artery and aortic calcifications. Pulmonary embolus noted within the left pulmonary artery extending into to segmental upper lobe branches, best seen on coronal images 48-52. No evidence of right heart strain Mediastinum/Nodes: No mediastinal, hilar, or axillary adenopathy. Lungs/Pleura: Airspace disease in both lower lobes concerning for pneumonia. Patchy airspace disease in the anterior right upper lobe and posterior lingula. Upper Abdomen: Multiple gallstones layering in the gallbladder. No acute findings. Musculoskeletal: Pacer in the left chest wall. No acute bony abnormality. Review of the MIP images confirms the above findings. IMPRESSION: Pulmonary emboli noted in the left upper lobe pulmonary artery extending into to segmental upper lobe branches. Bilateral lower lobe airspace consolidation. Patchy opacities in both upper lobes. Findings concerning for multifocal pneumonia. Coronary artery disease, aortic atherosclerosis. Cholelithiasis. Electronically Signed   By: Charlett NoseKevin  Dover M.D.   On: 05/16/2017 10:32   [place] Venous Img Lower Bilateral  Result Date: 05/16/2017 CLINICAL DATA:  Left leg swelling, shortness of breath, acute pulmonary emboli 1 EXAM: BILATERAL LOWER EXTREMITY VENOUS DOPPLER ULTRASOUND TECHNIQUE: Gray-scale sonography with graded compression, as well as color Doppler and duplex ultrasound were performed to evaluate the lower extremity deep venous systems  from the level of the common femoral vein and including the common femoral, femoral, profunda femoral, popliteal and calf veins including the posterior tibial, peroneal and gastrocnemius veins when visible. The superficial great saphenous vein was also interrogated. Spectral Doppler was utilized to evaluate flow at rest and with distal augmentation maneuvers in the common femoral, femoral and popliteal veins. COMPARISON:  07/31/2016 FINDINGS: RIGHT LOWER EXTREMITY Common Femoral Vein: No evidence of thrombus. Normal compressibility, respiratory phasicity and response to augmentation. Saphenofemoral Junction: No evidence of thrombus. Normal compressibility and flow on color Doppler imaging. Profunda Femoral Vein: No evidence of thrombus. Normal compressibility and flow on color Doppler imaging. Femoral Vein: No evidence of thrombus. Normal compressibility, respiratory phasicity and response to augmentation. Popliteal Vein: No evidence of thrombus. Normal compressibility, respiratory phasicity and response to augmentation. Calf Veins: Limited assessment of the calf veins. Superficial Great Saphenous Vein: No evidence of thrombus. Normal compressibility. Venous Reflux:  None. Other Findings:  None. LEFT LOWER EXTREMITY Common Femoral Vein: Acute hypoechoic intraluminal thrombus. Vessel is noncompressible. Femoral thrombus extends to the left iliac vein. Saphenofemoral Junction: Acute occlusive thrombus extends into the saphenous femoral junction. Profunda Femoral Vein: Acute partially occlusive thrombus. Vessel this noncompressible. Minimal phasic flow detected. Femoral Vein: Acute hypoechoic occlusive thrombus throughout the femoral vein. Popliteal Vein: Acute hypoechoic nearly occlusive thrombus within the popliteal vein. Calf Veins: Hypoechoic thrombus appears  to propagate into the calf peroneal and tibial veins. Superficial Great Saphenous Vein: Thrombus extends into the proximal left GSV. Venous Reflux:  None.  Other Findings:  Subcutaneous edema noted. IMPRESSION: Extensive acute occlusive left lower extremity DVT involving the iliac, femoral, popliteal, and calf veins. Negative for right lower extremity DVT. These results will be called to the ordering clinician or representative by the Radiologist Assistant, and communication documented in the PACS or zVision Dashboard. Electronically Signed   By: Judie PetitM.  Shick M.D.   On: 05/16/2017 14:38   Dg Chest Port 1 View  Result Date: 05/16/2017 CLINICAL DATA:  Respiratory distress.  Hypoxia.  COPD. EXAM: PORTABLE CHEST 1 VIEW COMPARISON:  04/12/2017 FINDINGS: There is a focal area of new increased density at the left lung base posterior medially. The lungs are otherwise clear. Heart size and vascularity are normal. Tortuosity and slight calcification of the thoracic aorta. No acute bone abnormality.  Pacemaker in place. IMPRESSION: Focal area of new consolidation at the left lung base posterior medially could represent atelectasis or pneumonia. Aortic atherosclerosis. Electronically Signed   By: Francene BoyersJames  Maxwell M.D.   On: 05/16/2017 10:00    EKG:   Orders placed or performed during the hospital encounter of 05/16/17  . EKG 12-Lead  . EKG 12-Lead    ASSESSMENT AND PLAN:   83 year old male with past medical history significant for complete heart block status post pacemaker, anxiety and depression, hypertension, COPD, arthritis, recent admission for traumatic rhabdomyolysis currently from assisted living facility came in with shortness of breath and noted to have PE and pneumonia  1. Septic shock-secondary to multifocal pneumonia. -Blood pressure and tachycardia improved. CT of the chest consistent with multifocal pneumonia. -Appreciate pulmonary intensivist consult -Currently on vancomycin and cefepime. -Follow blood cultures and sputum cultures.  2. Acute renal failure with hyperkalemia- ATN secondary to hypotension and sepsis yesterday. -patient got replaced  for hypokalemia yesterday, however hyperkalemia due to acute renal failure today. -Received Kayexalate. Continue to monitor urine output. Oliguric at this time -if remains so, might need dialysis. -Recommend nephrology consultation and also renal ultrasound.  3. Acute hypoxic respiratory failure-secondary to multifocal pneumonia and pulmonary emboli -Off BiPAP and currently requiring 4 L nasal cannula -Still has significant gurgling secretions. Encourage Mucinex, chest physical therapy and spirometer  4. DVT and PE-recent immobilization from fall and rehabilitation. -Left lower extremity DVT, but due to acute illness-not a candidate for thrombolysis. Appreciate vascular consult. -No right heart strain. Continue heparin drip and transitioned to oral anticoagulation 1 more stable.  5. DVT prophylaxis-already on IV heparin drip  CODE STATUS-limited  Code according to admission note   All the records are reviewed and case discussed with Care Management/Social Workerr. Management plans discussed with the patient, family and they are in agreement.  CODE STATUS: Limited  Code  TOTAL CRITICAL CARE TIME SPENT IN TAKING CARE OF THIS PATIENT: 39 minutes.   POSSIBLE D/C IN 3 DAYS, DEPENDING ON CLINICAL CONDITION.   Enid BaasKALISETTI,Calder Oblinger M.D on 05/17/2017 at 9:13 AM  Between 7am to 6pm - Pager - [phone]  After 6pm go to [email or web address] - Social research officer, governmentpassword EPAS [facility]  [facility]  Office  [phone]  CC: Primary care physician; Lauro RegulusAnderson, Marshall W, MD

## 2017-05-17 NOTE — Progress Notes (Signed)
PT Cancellation Note  Patient Details Name: Ronald Weber T Slinger MRN: [id number] DOB: August 24, 1928   Cancelled Treatment:    Reason Eval/Treat Not Completed: Medical issues which prohibited therapy(Patient remains inside 48-hour window for recommended anticoagulation; now also noted with critically high potassium levels (6.2).  Will continue hold and plan for evaluation next date as medically appropriate.)   Batsheva Stevick H. Manson PasseyBrown, PT, DPT, NCS 05/17/17, 8:38 AM [phone]

## 2017-05-17 NOTE — Progress Notes (Addendum)
[name], NP notified of fluctuating unstable BP's and patient's continued c/o uncontrolled pain. Acknowledged, see new orders.

## 2017-05-17 NOTE — Progress Notes (Signed)
Initial Nutrition Assessment  DOCUMENTATION CODES:   Non-severe (moderate) malnutrition in context of chronic illness  INTERVENTION:  Continue Ensure Enlive po TID, each supplement provides 350 kcal and 20 grams of protein. Will monitor electrolyte trend on CVVHD and adjust as needed.  Encouraged adequate intake of calories and protein from meals.  Provide Rena-vite daily to replace losses from CVVHD.  NUTRITION DIAGNOSIS:   Moderate Malnutrition related to chronic illness(COPD, CKD Stg III, Stg II pressure ulcer) as evidenced by 10.5% weight loss over 6 months, moderate fat depletion, moderate muscle depletion.  GOAL:   Patient will meet greater than or equal to 90% of their needs  MONITOR:   PO intake, Supplement acceptance, Labs, Weight trends, Skin, I & O's  REASON FOR ASSESSMENT:   Consult Assessment of nutrition requirement/status  ASSESSMENT:   83 year old male with PMHx of anxiety, depression, gout, sleep apnea, CKD stage III, HTN, COPD, chronic low back pain, diverticulitis, vitamin B12 deficiency, atrioventricular block s/p pacemaker placement admitted with acute hypoxic respiratory failure, PNA, sepsis, DVT and PE, acute renal failure on CKD III.  -Plan is for placement of catheter for initiation of CVVHD today.  Met with patient at bedside. He reports he is very nervous about dialysis starting today so he cannot answer many questions for RD. He reports he has had a decreased appetite for the past 3-4 days. He reports he recently fell. He has only been eating bites at meals, but reports he is unsure why. Unable to answer any further questions due to his anxiety. He requested RD leave.  UBW 181 lbs per pt. Per chart he was 183.1 lbs on 11/09/2016. Weight written on board was 74.5 kg (163.9 lbs). Patient has lost 19.2 lbs (10.5% body weight) over the past 6 months, which is significant for time frame. Current weight likely falsely elevated with fluid, so patient has likely  lost even more weight.  Medications reviewed and include: Kayexalate 30 grams daily, cefepime, heparin gtt, vancomycin.  Labs reviewed: CBG 70, Sodium 133, Potassium 6.1, CO2 16, BUN 62, Creatinine 4.35, Phosphorus 6.5, Magnesium 2.7.  Discussed with RN. Patient is refusing PO intake. He has a sore in his mouth.  NUTRITION - FOCUSED PHYSICAL EXAM:    Most Recent Value  Orbital Region  Moderate depletion  Upper Arm Region  Moderate depletion  Thoracic and Lumbar Region  Mild depletion  Buccal Region  Moderate depletion  Temple Region  Moderate depletion  Clavicle Bone Region  Moderate depletion  Clavicle and Acromion Bone Region  Moderate depletion  Scapular Bone Region  Moderate depletion  Dorsal Hand  Mild depletion  Patellar Region  Unable to assess [edema]  Anterior Thigh Region  Unable to assess [edema]  Posterior Calf Region  Unable to assess [edema]  Edema (RD Assessment)  Severe  Hair  Reviewed  Eyes  Reviewed  Mouth  Reviewed  Skin  Reviewed  Nails  Reviewed     Diet Order:  Diet 2 gram sodium Room service appropriate? Yes; Fluid consistency: Thin  EDUCATION NEEDS:   Not appropriate for education at this time  Skin:  Skin Assessment: Skin Integrity Issues: Skin Integrity Issues:: Stage II, Other (Comment) Stage II: bilateral buttocks Other: MSAD to buttocks  Last BM:  Unknown  Height:   Ht Readings from Last 1 Encounters:  05/16/17 '5\' 10"'$  (1.778 m)    Weight:   Wt Readings from Last 1 Encounters:  05/16/17 165 lb (74.8 kg)    Ideal Body  Weight:  75.5 kg  BMI:  Body mass index is 23.68 kg/m.  Estimated Nutritional Needs:   Kcal:  1870-2020 (MSJ x 1.3-1.4)  Protein:  115-135 grams (1.5-1.8 grams/kg on CVVHD)  Fluid:  1.9 L/day (25 mL/kg)  Willey Blade, MS, RD, LDN Office: [phone] Pager: [phone] After Hours/Weekend Pager: [phone]

## 2017-05-17 NOTE — Progress Notes (Signed)
ANTICOAGULATION CONSULT NOTE - Initial Consult  Pharmacy Consult for Heparin drip Indication: pulmonary embolus  Allergies  Allergen Reactions  . Colchicine   . Doxycycline Other (See Comments)  . Meloxicam Other (See Comments)  . Sulfa Antibiotics     Other reaction(s): UNKNOWN  . Tussin  [Guaifenesin] Other (See Comments)  . Amoxicillin-Pot Clavulanate Rash    Patient Measurements: Height: 5\' 10"  (177.8 cm) Weight: 164 lb 3.9 oz (74.5 kg) IBW/kg (Calculated) : 73 Heparin Dosing Weight: 74.8 kg   Vital Signs: Temp: 98.9 F (37.2 C) (12/12 0800) Temp Source: Oral (12/12 0800) BP: 122/98 (12/12 1200) Pulse Rate: 98 (12/12 1000)  Labs: Recent Labs    05/16/17 0931 05/16/17 1300 05/16/17 1958 05/17/17 0443 05/17/17 1130 05/17/17 1358  HGB 12.8*  --   --  11.9*  --   --   HCT 39.0*  --   --  37.0*  --   --   PLT 223  --   --  191  --   --   APTT 36  --   --   --   --   --   LABPROT 15.5*  --   --   --   --   --   INR 1.24  --   --   --   --   --   HEPARINUNFRC  --   --  0.58 0.56  --   --   CREATININE 2.28*  --  4.32* 4.36* 4.35* 4.37*  TROPONINI  --  0.11*  --   --   --   --     Estimated Creatinine Clearance: 12.5 mL/min (A) (by C-G formula based on SCr of 4.37 mg/dL (H)).   Medical History: Past Medical History:  Diagnosis Date  . Anxiety   . Atrioventricular block    s/p pacemaker placement  . B12 deficiency   . BPH with elevated PSA   . BPH with obstruction/lower urinary tract symptoms   . Chronic low back pain   . COPD (chronic obstructive pulmonary disease) (HCC)   . Depression   . Diverticulitis   . Gout   . Hypertension   . Lumbar disc disease   . Renal insufficiency   . Sensory loss   . Sleep apnea   . Trigeminal neuralgia   . Urge incontinence     Medications:  Scheduled:  . acetylcysteine  4 mL Nebulization Q6H  . albuterol  2.5 mg Nebulization Q6H  . aspirin EC  81 mg Oral Daily  . budesonide (PULMICORT) nebulizer solution  0.25  mg Nebulization Q12H  . Chlorhexidine Gluconate Cloth  6 each Topical Q0600  . feeding supplement (ENSURE ENLIVE)  237 mL Oral TID BM  . fesoterodine  4 mg Oral Daily  . Gerhardt's butt cream   Topical BID  . latanoprost  1 drop Both Eyes QHS  . lidocaine  1 patch Transdermal Q24H  . mouth rinse  15 mL Mouth Rinse BID  . metoprolol tartrate      . multivitamin  1 tablet Oral QHS  . mupirocin ointment  1 application Nasal BID  . sertraline  25 mg Oral Daily  . sodium polystyrene  30 g Oral 1 day or 1 dose   Infusions:  . calcium gluconate    . ceFEPime (MAXIPIME) IV    . heparin 1,250 Units/hr (05/17/17 1335)  . pureflow    . vancomycin      Assessment: 83 yo M with Pulmonary  embolism, ?sepsis, to start Heparin Drip. 12/12 heparin paused for vascath placement.   Goal of Therapy:  Heparin level 0.3-0.7 units/ml Monitor platelets by anticoagulation protocol: Yes   Plan:  Heparin infusion resumed at previous rate of 1250 units/hr at 1330. Will recheck a HL at 2130.   Luisa HartScott Minha Fulco, PharmD Clinical Pharmacist  05/17/17 3:28 PM

## 2017-05-17 NOTE — Consult Note (Signed)
WOC Nurse wound consult note Reason for Consult:Moisture associated skin damage to buttocks and perineal skin.  Stage 2 sacral pressure injury, present on admission.  Wound type:Moisture and pressure Pressure Injury POA: Yes Measurement: sacrum:  3 cm x 3 cm x 0.1 cm  Generalized erythema and denuded skin to buttocks and perineal area Wound ZDG:UYQIbed:pink and moist Drainage (amount, consistency, odor) scant weeping Periwound:blanchable erythema Dressing procedure/placement/frequency: Cleanse buttocks with soap and water and pat dry.  Apply Gerhardt's butt paste twice daily and PRN soilage.   Will not follow at this time.  Please re-consult if needed.  Maple HudsonKaren Tamanika Heiney RN BSN CWON Pager [phone]

## 2017-05-17 NOTE — Progress Notes (Addendum)
Pt c/o pain not relieved with currrent PRN pain medication. NP notified and acknowledged. Awaiting new orders.

## 2017-05-18 DIAGNOSIS — J411 Mucopurulent chronic bronchitis: Secondary | ICD-10-CM

## 2017-05-18 LAB — CBC
HCT: 28.4 % — ABNORMAL LOW (ref 40.0–52.0)
Hemoglobin: 9.5 g/dL — ABNORMAL LOW (ref 13.0–18.0)
MCH: 29.4 pg (ref 26.0–34.0)
MCHC: 33.4 g/dL (ref 32.0–36.0)
MCV: 88 fL (ref 80.0–100.0)
PLATELETS: 165 10*3/uL (ref 150–440)
RBC: 3.23 MIL/uL — ABNORMAL LOW (ref 4.40–5.90)
RDW: 15.2 % — ABNORMAL HIGH (ref 11.5–14.5)
WBC: 8.6 10*3/uL (ref 3.8–10.6)

## 2017-05-18 LAB — HEMOGLOBIN AND HEMATOCRIT, BLOOD
HCT: 27.5 % — ABNORMAL LOW (ref 40.0–52.0)
HEMOGLOBIN: 9.1 g/dL — AB (ref 13.0–18.0)

## 2017-05-18 LAB — LACTIC ACID, PLASMA
LACTIC ACID, VENOUS: 1.9 mmol/L (ref 0.5–1.9)
Lactic Acid, Venous: 2.9 mmol/L (ref 0.5–1.9)

## 2017-05-18 LAB — RENAL FUNCTION PANEL
ALBUMIN: 2 g/dL — AB (ref 3.5–5.0)
ALBUMIN: 1.8 g/dL — AB (ref 3.5–5.0)
ANION GAP: 11 (ref 5–15)
ANION GAP: 8 (ref 5–15)
ANION GAP: 8 (ref 5–15)
Albumin: 1.8 g/dL — ABNORMAL LOW (ref 3.5–5.0)
Albumin: 2.1 g/dL — ABNORMAL LOW (ref 3.5–5.0)
Albumin: 1.9 g/dL — ABNORMAL LOW (ref 3.5–5.0)
Albumin: 1.8 g/dL — ABNORMAL LOW (ref 3.5–5.0)
Anion gap: 8 (ref 5–15)
Anion gap: 11 (ref 5–15)
Anion gap: 8 (ref 5–15)
BUN: 18 mg/dL (ref 6–20)
BUN: 20 mg/dL (ref 6–20)
BUN: 23 mg/dL — ABNORMAL HIGH (ref 6–20)
BUN: 25 mg/dL — ABNORMAL HIGH (ref 6–20)
BUN: 31 mg/dL — AB (ref 6–20)
BUN: 37 mg/dL — AB (ref 6–20)
CALCIUM: 7.9 mg/dL — AB (ref 8.9–10.3)
CALCIUM: 8 mg/dL — AB (ref 8.9–10.3)
CALCIUM: 7.8 mg/dL — AB (ref 8.9–10.3)
CHLORIDE: 103 mmol/L (ref 101–111)
CHLORIDE: 104 mmol/L (ref 101–111)
CHLORIDE: 105 mmol/L (ref 101–111)
CO2: 21 mmol/L — AB (ref 22–32)
CO2: 22 mmol/L (ref 22–32)
CO2: 22 mmol/L (ref 22–32)
CO2: 22 mmol/L (ref 22–32)
CO2: 23 mmol/L (ref 22–32)
CO2: 23 mmol/L (ref 22–32)
CREATININE: 1.83 mg/dL — AB (ref 0.61–1.24)
Calcium: 8.3 mg/dL — ABNORMAL LOW (ref 8.9–10.3)
Calcium: 8 mg/dL — ABNORMAL LOW (ref 8.9–10.3)
Calcium: 7.9 mg/dL — ABNORMAL LOW (ref 8.9–10.3)
Chloride: 101 mmol/L (ref 101–111)
Chloride: 105 mmol/L (ref 101–111)
Chloride: 104 mmol/L (ref 101–111)
Creatinine, Ser: 2.21 mg/dL — ABNORMAL HIGH (ref 0.61–1.24)
Creatinine, Ser: 1.46 mg/dL — ABNORMAL HIGH (ref 0.61–1.24)
Creatinine, Ser: 1.41 mg/dL — ABNORMAL HIGH (ref 0.61–1.24)
Creatinine, Ser: 1.07 mg/dL (ref 0.61–1.24)
Creatinine, Ser: 0.91 mg/dL (ref 0.61–1.24)
GFR calc Af Amer: 29 mL/min — ABNORMAL LOW (ref >60)
GFR calc Af Amer: 50 mL/min — ABNORMAL LOW (ref >60)
GFR calc Af Amer: >60 mL/min (ref >60)
GFR calc non Af Amer: 32 mL/min — ABNORMAL LOW (ref >60)
GFR calc non Af Amer: 44 mL/min — ABNORMAL LOW (ref >60)
GFR calc non Af Amer: >60 mL/min (ref >60)
GFR, EST AFRICAN AMERICAN: 37 mL/min — AB (ref >60)
GFR, EST AFRICAN AMERICAN: 48 mL/min — AB (ref >60)
GFR, EST NON AFRICAN AMERICAN: 25 mL/min — AB (ref >60)
GFR, EST NON AFRICAN AMERICAN: 42 mL/min — AB (ref >60)
GLUCOSE: 85 mg/dL (ref 65–99)
GLUCOSE: 90 mg/dL (ref 65–99)
Glucose, Bld: 82 mg/dL (ref 65–99)
Glucose, Bld: 136 mg/dL — ABNORMAL HIGH (ref 65–99)
Glucose, Bld: 93 mg/dL (ref 65–99)
Glucose, Bld: 100 mg/dL — ABNORMAL HIGH (ref 65–99)
PHOSPHORUS: 2.9 mg/dL (ref 2.5–4.6)
PHOSPHORUS: 2.2 mg/dL — AB (ref 2.5–4.6)
POTASSIUM: 3.4 mmol/L — AB (ref 3.5–5.1)
POTASSIUM: 3.4 mmol/L — AB (ref 3.5–5.1)
POTASSIUM: 3.9 mmol/L (ref 3.5–5.1)
POTASSIUM: 4.7 mmol/L (ref 3.5–5.1)
Phosphorus: 4.1 mg/dL (ref 2.5–4.6)
Phosphorus: 3.6 mg/dL (ref 2.5–4.6)
Phosphorus: 3.1 mg/dL (ref 2.5–4.6)
Phosphorus: 2.4 mg/dL — ABNORMAL LOW (ref 2.5–4.6)
Potassium: 3.6 mmol/L (ref 3.5–5.1)
Potassium: 4 mmol/L (ref 3.5–5.1)
SODIUM: 134 mmol/L — AB (ref 135–145)
SODIUM: 135 mmol/L (ref 135–145)
SODIUM: 135 mmol/L (ref 135–145)
Sodium: 134 mmol/L — ABNORMAL LOW (ref 135–145)
Sodium: 136 mmol/L (ref 135–145)
Sodium: 135 mmol/L (ref 135–145)

## 2017-05-18 LAB — MAGNESIUM
MAGNESIUM: 1.7 mg/dL (ref 1.7–2.4)
MAGNESIUM: 2 mg/dL (ref 1.7–2.4)
Magnesium: 1.9 mg/dL (ref 1.7–2.4)
Magnesium: 1.9 mg/dL (ref 1.7–2.4)
Magnesium: 1.8 mg/dL (ref 1.7–2.4)
Magnesium: 1.6 mg/dL — ABNORMAL LOW (ref 1.7–2.4)

## 2017-05-18 LAB — HEPARIN LEVEL (UNFRACTIONATED)
HEPARIN UNFRACTIONATED: 0.31 [IU]/mL (ref 0.30–0.70)
Heparin Unfractionated: 0.52 IU/mL (ref 0.30–0.70)

## 2017-05-18 LAB — GLUCOSE, CAPILLARY
GLUCOSE-CAPILLARY: 71 mg/dL (ref 65–99)
GLUCOSE-CAPILLARY: 103 mg/dL — AB (ref 65–99)
Glucose-Capillary: 93 mg/dL (ref 65–99)

## 2017-05-18 LAB — PROCALCITONIN: PROCALCITONIN: 4.65 ng/mL

## 2017-05-18 LAB — APTT

## 2017-05-18 LAB — OCCULT BLOOD X 1 CARD TO LAB, STOOL: FECAL OCCULT BLD: POSITIVE — AB

## 2017-05-18 MED ORDER — DEXTROSE-NACL 5-0.9 % IV SOLN
INTRAVENOUS | Status: DC
  Administered 2017-05-18 – 2017-05-19 (×2): via INTRAVENOUS

## 2017-05-18 MED ORDER — ALBUMIN HUMAN 25 % IV SOLN
12.5000 g | Freq: Once | INTRAVENOUS | Status: AC
  Administered 2017-05-18: 12.5 g via INTRAVENOUS
  Filled 2017-05-18: qty 50

## 2017-05-18 MED ORDER — PUREFLOW DIALYSIS SOLUTION
INTRAVENOUS | Status: DC
  Administered 2017-05-18 (×3): 3 via INTRAVENOUS_CENTRAL

## 2017-05-18 MED ORDER — MAGNESIUM SULFATE IN D5W 1-5 GM/100ML-% IV SOLN
1.0000 g | Freq: Once | INTRAVENOUS | Status: AC
  Administered 2017-05-18: 1 g via INTRAVENOUS
  Filled 2017-05-18: qty 100

## 2017-05-18 MED ORDER — PANTOPRAZOLE SODIUM 40 MG IV SOLR
40.0000 mg | Freq: Two times a day (BID) | INTRAVENOUS | Status: DC
  Administered 2017-05-18 – 2017-05-19 (×3): 40 mg via INTRAVENOUS
  Filled 2017-05-18 (×3): qty 40

## 2017-05-18 MED ORDER — POTASSIUM PHOSPHATES 15 MMOLE/5ML IV SOLN
30.0000 mmol | Freq: Once | INTRAVENOUS | Status: AC
  Administered 2017-05-18: 30 mmol via INTRAVENOUS
  Filled 2017-05-18: qty 10

## 2017-05-18 MED ORDER — IPRATROPIUM-ALBUTEROL 0.5-2.5 (3) MG/3ML IN SOLN
3.0000 mL | Freq: Four times a day (QID) | RESPIRATORY_TRACT | Status: DC
  Administered 2017-05-18 – 2017-05-19 (×5): 3 mL via RESPIRATORY_TRACT
  Filled 2017-05-18 (×5): qty 3

## 2017-05-18 MED ORDER — SODIUM CHLORIDE 0.9 % IV BOLUS (SEPSIS)
500.0000 mL | Freq: Once | INTRAVENOUS | Status: AC
  Administered 2017-05-18: 500 mL via INTRAVENOUS

## 2017-05-18 NOTE — Progress Notes (Signed)
Oriented to person only. Generalized weakness and drowsy. Speech difficult to understand at times. Follows some commands. MOEx4.  D5NS at 1850ml/hr. Heparin gtt at 1250u/hr. No signs of bleeding. Hgb 9.5 at 0522. Repeated at 1300 and Hgb 9.1. CRRT with 4 K+ bath has run without complication. Aline was inserted today;. Aline with good waveform and blood pressures are WNL.  Stool for OB sent.  Flexiseal inserted for repeated liquid brown stools and to help with excoriation to buttocks and scrotum. C/o being "cold", thinks we want to "kill" him. And frequently says "Renae FicklePaul  I'm dying".  Morphine 2mg  given x1 today for frequent moaning and likely pain while family here. Most of day he has not appeared in pain until turning for pericare and bath. Family trying to decide on goals of care. They have said they ultimately do not want him to suffer and be in pain. If his condition deteriorates, they will likely choose comfort care only.

## 2017-05-18 NOTE — Progress Notes (Signed)
SLP Cancellation Note  Patient Details Name: Ronald Weber MRN: [id number] DOB: 05-08-1929   Cancelled treatment:       Reason Eval/Treat Not Completed: Fatigue/lethargy limiting ability to participate;Medical issues which prohibited therapy;Patient at procedure or test/unavailable(chart reviewed; consulted NSG, met pt briefly). Pt is receiving CCRT at this time; lethargic but arousable briefly to moderate verbal/tactile stim. When asked if pt wanted something to eat or to wet his mouth, he said "No".  Due to pt's pulmonary status (per CXR, chest CT), congestion, CCRT, and current drowsiness/lethargy, recommend pt remain NPO until a BSE can be completed to assess pt's safety w/ po intake, overall swallow function. NSG agreed. ST services will f/u in the morning. Recommend frequent oral care for hygiene and stimulation of swallowing.    Orinda Kenner, MS, CCC-SLP Watson,Katherine 05/18/2017, 3:23 PM

## 2017-05-18 NOTE — Progress Notes (Signed)
Notified ICU NP [name] of positive occult blood stool sample. No new orders obtained at this time.

## 2017-05-18 NOTE — Progress Notes (Signed)
[facility] Pulmonary [facility] Consultation     Date: 05/18/2017,   MRN# [id number] Ronald Weber 28-Jul-1928 Code Status:     Code Status Orders  (From admission, onward)        Start     Ordered   05/17/17 1108  Do not attempt resuscitation (DNR)  Continuous    Question Answer Comment  In the event of cardiac or respiratory ARREST Do not call a "code blue"   In the event of cardiac or respiratory ARREST Do not perform Intubation, CPR, defibrillation or ACLS   In the event of cardiac or respiratory ARREST Use medication by any route, position, wound care, and other measures to relive pain and suffering. May use oxygen, suction and manual treatment of airway obstruction as needed for comfort.      05/17/17 1108    Code Status History    Date Active Date Inactive Code Status Order ID Comments User Context   05/16/2017 16:28 05/17/2017 11:08 Full Code [id number]  Morton StallGriffin, Crystal, NP Inpatient   05/16/2017 12:48 05/16/2017 16:28 Partial Code [id number]  Enedina FinnerPatel, Sona, MD Inpatient   04/08/2017 13:52 04/12/2017 21:57 Full Code [id number]  Marguarite ArbourSparks, Jeffrey D, MD Inpatient        AdmissionWeight: 165 lb (74.8 kg)                 CurrentWeight: 172 lb 9.9 oz (78.3 kg) Ronald Weber is a 83 y.o. old male     SUBJECTIVE:   Overnight events noted. The patient had episodes of hypotension immediately followed by hypertension. Has remained hemodynamically stable since this morning. Hemoglobin drop noted.  No melena in bowel movements this morning.  No overt bleed.  Per RN reporting, CRRT circuit stopped a couple of times yesterday in the evening. Patient is cumulatively 3 L positive. Pro-calcitonin improving. Creatinine improved.  Hyperkalemia resolved.   MEDICATIONS    Current Medication:   Current Facility-Administered Medications:  .  acetaminophen (TYLENOL) tablet 650 mg, 650 mg, Oral, Q6H PRN, 650 mg at 05/17/17 0515 **OR** acetaminophen (TYLENOL) suppository 650 mg, 650 mg,  Rectal, Q6H PRN, Enedina FinnerPatel, Sona, MD .  aspirin EC tablet 81 mg, 81 mg, Oral, Daily, Enedina FinnerPatel, Sona, MD, 81 mg at 05/16/17 1352 .  budesonide (PULMICORT) nebulizer solution 0.25 mg, 0.25 mg, Nebulization, Q12H, Tukov, Magadalene S, NP, 0.25 mg at 05/18/17 0743 .  ceFEPIme (MAXIPIME) 1 g in dextrose 5 % 50 mL IVPB, 1 g, Intravenous, Q8H, Enid BaasKalisetti, Radhika, MD, Stopped at 05/18/17 [phone] .  Chlorhexidine Gluconate Cloth 2 % PADS 6 each, 6 each, Topical, Q0600, Lewie Loronukov, Magadalene S, NP, 6 each at 05/18/17 [phone] .  dextrose 5 %-0.9 % sodium chloride infusion, , Intravenous, Continuous, Cristy HiltsKhan, Macaria Bias, MD, Last Rate: 50 mL/hr at 05/18/17 0925 .  feeding supplement (ENSURE ENLIVE) (ENSURE ENLIVE) liquid 237 mL, 237 mL, Oral, TID BM, Tukov, Magadalene S, NP, 237 mL at 05/17/17 2045 .  fesoterodine (TOVIAZ) tablet 4 mg, 4 mg, Oral, Daily, Enedina FinnerPatel, Sona, MD, 4 mg at 05/17/17 1050 .  Gerhardt's butt cream, , Topical, BID, Nemiah CommanderKalisetti, Radhika, MD .  heparin ADULT infusion 100 units/mL (25000 units/24550mL sodium chloride 0.45%), 1,250 Units/hr, Intravenous, Continuous, Governor RooksLord, Rebecca, MD, Stopped at 05/18/17 0900 .  heparin injection 1,000-6,000 Units, 1,000-6,000 Units, CRRT, PRN, Cherylann RatelLateef, Munsoor, MD, 2,600 Units at 05/17/17 1308 .  ipratropium-albuterol (DUONEB) 0.5-2.5 (3) MG/3ML nebulizer solution 3 mL, 3 mL, Nebulization, Q6H, Cristy HiltsKhan, Saiquan Hands, MD .  latanoprost (XALATAN) 0.005 % ophthalmic solution 1 drop, 1  drop, Both Eyes, QHS, Enedina Finner, MD, 1 drop at 05/17/17 2155 .  lidocaine (LIDODERM) 5 % 1 patch, 1 patch, Transdermal, Q24H, Enedina Finner, MD, 1 patch at 05/18/17 1024 .  lidocaine (XYLOCAINE) 2 % viscous mouth solution 15 mL, 15 mL, Mouth/Throat, Q4H PRN, Belia Heman, Kurian, MD, 15 mL at 05/17/17 1333 .  MEDLINE mouth rinse, 15 mL, Mouth Rinse, BID, Kasa, Kurian, MD, 15 mL at 05/18/17 1031 .  morphine 2 MG/ML injection 2 mg, 2 mg, Intravenous, Q2H PRN, Varughese, [name] S, NP .  multivitamin (RENA-VIT) tablet 1 tablet, 1  tablet, Oral, QHS, Erin Fulling, MD, 1 tablet at 05/17/17 2138 .  mupirocin ointment (BACTROBAN) 2 % 1 application, 1 application, Nasal, BID, Tukov, Magadalene S, NP, 1 application at 05/18/17 1024 .  ondansetron (ZOFRAN) tablet 4 mg, 4 mg, Oral, Q6H PRN **OR** ondansetron (ZOFRAN) injection 4 mg, 4 mg, Intravenous, Q6H PRN, Enedina Finner, MD .  oxyCODONE (Oxy IR/ROXICODONE) immediate release tablet 5 mg, 5 mg, Oral, Q4H PRN, Varughese, [name] S, NP, 5 mg at 05/17/17 2312 .  pantoprazole (PROTONIX) injection 40 mg, 40 mg, Intravenous, Q12H, Cristy Hilts, MD, 40 mg at 05/18/17 1025 .  polyethylene glycol (MIRALAX / GLYCOLAX) packet 17 g, 17 g, Oral, Daily PRN, Enedina Finner, MD .  pureflow IV solution for Dialysis, , CRRT, Continuous, Lateef, Munsoor, MD, Last Rate: 2,500 mL/hr at 05/18/17 0955, 3 each at 05/18/17 0955 .  sertraline (ZOLOFT) tablet 25 mg, 25 mg, Oral, Daily, Enedina Finner, MD, 25 mg at 05/17/17 1050 .  sodium chloride flush (NS) 0.9 % injection 10-40 mL, 10-40 mL, Intracatheter, Q12H, Enid Baas, MD, 10 mL at 05/17/17 2218 .  sodium chloride flush (NS) 0.9 % injection 10-40 mL, 10-40 mL, Intracatheter, PRN, Enid Baas, MD .  vancomycin (VANCOCIN) IVPB 1000 mg/200 mL premix, 1,000 mg, Intravenous, Q24H, Enid Baas, MD, Stopped at 05/17/17 1935    VS: BP (!) 89/76   Pulse 85   Temp 98.3 F (36.8 C) (Axillary)   Resp 17   Ht 5\' 10"  (1.778 m)   Wt 172 lb 9.9 oz (78.3 kg)   SpO2 97%   BMI 24.77 kg/m      PHYSICAL EXAM   Awake but lethargic. Follows commands. CVS S1, S2, 0. Chest is clear to auscultation bilaterally with no rales rhonchi or wheezes. Abdomen is soft, nontender, nondistended.  Bowel sounds are positive. Lower extremity edema left more than right. Lower extremity pulses intact.   LABS    Recent Labs    05/16/17 0931  05/17/17 0443  05/17/17 1719 05/17/17 2112 05/18/17 0058 05/18/17 0522  HGB 12.8*  --  11.9*  --   --   --   --   9.5*  HCT 39.0*  --  37.0*  --   --   --   --  28.4*  MCV 88.2  --  91.0  --   --   --   --  88.0  WBC 16.6*  --  10.7*  --   --   --   --  8.6  BUN 34*   < > 62*   < > 66* 45* 37* 31*  CREATININE 2.28*   < > 4.36*   < > 4.43* 2.77* 2.21* 1.83*  GLUCOSE 56*   < > 64*   < > 77 93 85 82  CALCIUM <4.0*   < > 8.1*   < > 8.1* 8.0* 8.3* 8.0*  INR 1.24  --   --   --   --   --   --   --    < > =  values in this interval not displayed.  ,    No results for input(s): PH in the last 72 hours.  Invalid input(s): PCO2, PO2, BASEEXCESS, BASEDEFICITE, TFT    CULTURE RESULTS   Recent Results (from the past 240 hour(s))  Culture, blood (Routine x 2)     Status: None (Preliminary result)   Collection Time: 05/16/17  9:29 AM  Result Value Ref Range Status   Specimen Description BLOOD LEFT FORARM  Final   Special Requests BOTTLES DRAWN AEROBIC AND ANAEROBIC BCHV  Final   Culture NO GROWTH 2 DAYS  Final   Report Status PENDING  Incomplete  Culture, blood (Routine x 2)     Status: None (Preliminary result)   Collection Time: 05/16/17  9:29 AM  Result Value Ref Range Status   Specimen Description BLOOD RIGHT ARM  Final   Special Requests BOTTLES DRAWN AEROBIC AND ANAEROBIC  BCHV  Final   Culture NO GROWTH 2 DAYS  Final   Report Status PENDING  Incomplete  MRSA PCR Screening     Status: Abnormal   Collection Time: 05/16/17  1:18 PM  Result Value Ref Range Status   MRSA by PCR POSITIVE (A) NEGATIVE Final    Comment:        The GeneXpert MRSA Assay (FDA approved for NASAL specimens only), is one component of a comprehensive MRSA colonization surveillance program. It is not intended to diagnose MRSA infection nor to guide or monitor treatment for MRSA infections. RESULT CALLED TO, READ BACK BY AND VERIFIED WITH: JAKE ROBINSON 05/16/17 1731 KLW           IMAGING    Dg Chest Port 1 View  Result Date: 05/17/2017 CLINICAL DATA:  PICC line placement EXAM: PORTABLE CHEST 1 VIEW  COMPARISON:  05/17/2017 FINDINGS: Interval placement of RIGHT PICC line tip in distal SVC. RIGHT central venous line from IJ unchanged. Normal cardiac silhouette. Low lung volumes. There is mild central pulmonary edema pattern. No pneumothorax. IMPRESSION: RIGHT PICC line tip in distal SVC. Mild central pulmonary edema pattern. Electronically Signed   By: Genevive BiStewart  Edmunds M.D.   On: 05/17/2017 19:08   Dg Chest Port 1 View  Result Date: 05/17/2017 CLINICAL DATA:  Status post central line placement. Acute pulmonary emboli. History of COPD, former smoker. EXAM: PORTABLE CHEST 1 VIEW COMPARISON:  CT scan of the chest of May 16, 2017 FINDINGS: The lungs are mildly hypoinflated. The right internal jugular venous catheter tip projects at the cavoatrial junction. There is no postprocedure pneumothorax or hemothorax. The perihilar interstitial markings are increased. The heart is normal in size. There is tortuosity of the ascending and descending thoracic aorta. The ICD is in stable position. IMPRESSION: No postprocedure complication following right internal jugular venous catheter placement whose tip projects at the cavoatrial junction. Patchy bilateral interstitial and alveolar opacities consistent with pneumonia. Electronically Signed   By: David  SwazilandJordan M.D.   On: 05/17/2017 13:40      ASSESSMENT/PLAN    83 years old gentleman with a past medical history significant for anxiety, depression, complete heart block status post pacemaker placement, COPD, gout, hypertension, sleep apnea, trigeminal neuralgia, BPH, tobacco abuse with a recent admission to the hospital with fall and rhabdo who is admitted to the ICU with the following  Healthcare associated pneumonia Left PE Left leg DVT Acute on chronic renal failure-on CRRT now Hyperkalemia-resolved Anemia  Patient is on 4 L of oxygen and is maintaining oxygen saturations well.  He is not in  respiratory distress. Continue heparin drip. Continue  Pulmicort twice daily. Change duo nebs to every 6 hours.  Patient is on cefepime and vancomycin.  If cultures remain negative by tomorrow, will DC the vancomycin.  Hemoglobin drop noted.  No overt bleed.  No melena. Patient is 3 L positive. Hemoglobin drop could be secondary to the CRRT circuits that was changed as well as dilutional. However, given that the patient is on heparin drip, will closely monitor the hemoglobins and repeat hemoglobin this afternoon. We will check stool for occult bleeding.  We will empirically start the patient on twice daily Protonix. If the patient develops active bleed, will have to stop the heparin drip and in that case we will proceed with IVC filter placement.  Hemodynamic issues from last night noted.  Patient's blood pressure has been stable since this morning. We will place an A-line for invasive hemodynamic monitoring.  Heparin drip held for 2 hours for a line placement  Dialysis per nephrology. I will DC the [facility].  CT chest independently reviewed.  Pneumonia and PE noted.  On the CT chest the patient's esophagus seems to be dilated. We will get a swallow evaluation to assess for aspiration.  The patient is not eating.  We will start D5 NS at 50 mL's per hour.  Continue aspirin  Continue Zoloft.  Protonix and heparin drip as above. Glucose checks for glycemic control.  Continue ICU care.  Discussed at bedside with the patient's son.  All his questions were answered.  Total critical care time spent was 55 minutes.   Cristy Hilts, M.D.  Pulmonary & Critical Care Medicine

## 2017-05-18 NOTE — Progress Notes (Signed)
Matt in pharmacy notified of critical aptt >160. Acknowledged. No new orders at this time.

## 2017-05-18 NOTE — Progress Notes (Signed)
Patient awake and alert. Refusing bladder scan and oral care. Charge, RN in at bedside to speak with patient. Patient continues to refuse care to Charge, Charity fundraiserN.

## 2017-05-18 NOTE — Progress Notes (Signed)
ANTICOAGULATION CONSULT NOTE - Initial Consult  Pharmacy Consult for Heparin drip Indication: pulmonary embolus  Allergies  Allergen Reactions  . Colchicine   . Doxycycline Other (See Comments)  . Meloxicam Other (See Comments)  . Sulfa Antibiotics     Other reaction(s): UNKNOWN  . Tussin  [Guaifenesin] Other (See Comments)  . Amoxicillin-Pot Clavulanate Rash    Patient Measurements: Height: 5\' 10"  (177.8 cm) Weight: 172 lb 9.9 oz (78.3 kg) IBW/kg (Calculated) : 73 Heparin Dosing Weight: 74.8 kg   Vital Signs: Temp: 97.6 F (36.4 C) (12/13 1958) Temp Source: Axillary (12/13 1958) BP: 107/64 (12/13 2100) Pulse Rate: 88 (12/13 2100)  Labs: Recent Labs    05/16/17 0931 05/16/17 1300  05/17/17 0443  05/17/17 2112  05/18/17 0522  05/18/17 1300 05/18/17 1659 05/18/17 2043  HGB 12.8*  --   --  11.9*  --   --   --  9.5*  --  9.1*  --   --   HCT 39.0*  --   --  37.0*  --   --   --  28.4*  --  27.5*  --   --   PLT 223  --   --  191  --   --   --  165  --   --   --   --   APTT 36  --   --   --   --   --   --  >160*  --   --   --   --   LABPROT 15.5*  --   --   --   --   --   --   --   --   --   --   --   INR 1.24  --   --   --   --   --   --   --   --   --   --   --   HEPARINUNFRC  --   --    < > 0.56  --  0.56  --  0.52  --   --   --  0.31  CREATININE 2.28*  --    < > 4.36*   < > 2.77*   < > 1.83*   < > 1.41* 1.07 0.91  TROPONINI  --  0.11*  --   --   --   --   --   --   --   --   --   --    < > = values in this interval not displayed.    Estimated Creatinine Clearance: 60.2 mL/min (by C-G formula based on SCr of 0.91 mg/dL).   Medical History: Past Medical History:  Diagnosis Date  . Anxiety   . Atrioventricular block    s/p pacemaker placement  . B12 deficiency   . BPH with elevated PSA   . BPH with obstruction/lower urinary tract symptoms   . Chronic low back pain   . COPD (chronic obstructive pulmonary disease) (HCC)   . Depression   . Diverticulitis   .  Gout   . Hypertension   . Lumbar disc disease   . Renal insufficiency   . Sensory loss   . Sleep apnea   . Trigeminal neuralgia   . Urge incontinence     Medications:  Scheduled:  . aspirin EC  81 mg Oral Daily  . budesonide (PULMICORT) nebulizer solution  0.25 mg Nebulization Q12H  . Chlorhexidine Gluconate Cloth  6 each Topical Q0600  . feeding supplement (ENSURE ENLIVE)  237 mL Oral TID BM  . fesoterodine  4 mg Oral Daily  . Gerhardt's butt cream   Topical BID  . ipratropium-albuterol  3 mL Nebulization Q6H  . latanoprost  1 drop Both Eyes QHS  . lidocaine  1 patch Transdermal Q24H  . mouth rinse  15 mL Mouth Rinse BID  . multivitamin  1 tablet Oral QHS  . mupirocin ointment  1 application Nasal BID  . pantoprazole (PROTONIX) IV  40 mg Intravenous Q12H  . sertraline  25 mg Oral Daily  . sodium chloride flush  10-40 mL Intracatheter Q12H   Infusions:  . ceFEPime (MAXIPIME) IV Stopped (05/18/17 1400)  . dextrose 5 % and 0.9% NaCl 50 mL/hr at 05/18/17 0925  . heparin 1,250 Units/hr (05/18/17 1930)  . pureflow 3 each (05/18/17 1453)  . vancomycin Stopped (05/18/17 1840)    Assessment: 83 yo M with Pulmonary embolism, ?sepsis, to start Heparin Drip. Heparin paused for A-line insertion  Goal of Therapy:  Heparin level 0.3-0.7 units/ml Monitor platelets by anticoagulation protocol: Yes   Plan:  Heparin resumed at 1300 at previous rate. Will recheck a HL in 8 hours.   12/13 : HL @ 20:43 = 0.31  Will continue this pt on current rate and recheck HL in 8 hrs.   Evertt Chouinard D Clinical Pharmacist  05/18/17 9:51 PM

## 2017-05-18 NOTE — Progress Notes (Addendum)
[name], NP notified of magnesium 1.6 and phosphorus  2.2; Also updated NP that patient is refusing bladder scan and that the patient's son has been notified and is his way up to speak with patient. Acknowledged, see new orders.

## 2017-05-18 NOTE — Progress Notes (Signed)
Spoke with [name] NP regarding fluctuating cuff blood pressures, NP verbally acknowledged. NP recommended to check patients blood pressures every one hour instead of every 30 minutes. At this time NP does not recommend Arterial line because patient is not currently on pressors. Also earlier in shift multiple nurses tried to obtain manual blood pressures in left arm and left wrist, unable to hear korotkoff sounds. Blood pressure cuffs are appropriately sized for extremity.

## 2017-05-18 NOTE — Progress Notes (Signed)
[facility]  ROUNDING NOTE   Subjective:  Patient seen at bedside. Progressing well with CRRT. Hyperkalemia improved.   Objective:  Vital signs in last 24 hours:  Temp:  [97 F (36.1 C)-98.8 F (37.1 C)] 98.3 F (36.8 C) (12/13 0723) Pulse Rate:  [57-121] 95 (12/13 0930) Resp:  [15-29] 21 (12/13 0930) BP: (58-184)/(21-151) 110/76 (12/13 0930) SpO2:  [63 %-100 %] 96 % (12/13 0930) Weight:  [74.5 kg (164 lb 3.9 oz)-78.3 kg (172 lb 9.9 oz)] 78.3 kg (172 lb 9.9 oz) (12/13 0500)  Weight change: -0.344 kg (-12.1 oz) Filed Weights   05/16/17 0927 05/17/17 1500 05/18/17 0500  Weight: 74.8 kg (165 lb) 74.5 kg (164 lb 3.9 oz) 78.3 kg (172 lb 9.9 oz)    Intake/Output: I/O last 3 completed shifts: In: 1990.6 [I.V.:424; IV Piggyback:1566.7] Out: 1150 [Urine:1150]   Intake/Output this shift:  Total I/O In: 12.5 [I.V.:12.5] Out: 0   Physical Exam: General: Critically ill appearing  Head: Normocephalic, atraumatic. Moist oral mucosal membranes  Eyes: Anicteric  Neck: Supple, trachea midline  Lungs:  Scattered rhonchi, normal effort  Heart: S1S2 paced rhythm  Abdomen:  Soft, nontender, bowel sounds present  Extremities: L > R LE edema  Neurologic: Awake, alert, will follow simple commands  Skin: No lesions  Access: R IJ temporary dialysis catheter    Basic Metabolic Panel: Recent Labs  Lab 05/17/17 1358 05/17/17 1719 05/17/17 2112 05/18/17 0058 05/18/17 0522  NA 132* 132* 135 134* 136  K 6.0* 5.6* 5.0 4.7 3.9  CL 104 105 105 105 103  CO2 16* 16* 20* 21* 22  GLUCOSE 98 77 93 85 82  BUN 63* 66* 45* 37* 31*  CREATININE 4.37* 4.43* 2.77* 2.21* 1.83*  CALCIUM 7.8* 8.1* 8.0* 8.3* 8.0*  MG 2.7* 2.8* 2.1 2.0 1.9  PHOS 6.6* 6.6* 4.6 4.1 3.6    Liver Function Tests: Recent Labs  Lab 05/16/17 0931  05/17/17 1358 05/17/17 1719 05/17/17 2112 05/18/17 0058 05/18/17 0522  AST 71*  --   --   --   --   --   --   ALT 57  --   --   --   --   --   --    ALKPHOS 54  --   --   --   --   --   --   BILITOT 0.7  --   --   --   --   --   --   PROT <3.0*  --   --   --   --   --   --   ALBUMIN <1.0*   < > 1.8* 1.8* 1.9* 1.8* 2.1*   < > = values in this interval not displayed.   No results for input(s): LIPASE, AMYLASE in the last 168 hours. No results for input(s): AMMONIA in the last 168 hours.  CBC: Recent Labs  Lab 05/16/17 0931 05/17/17 0443 05/18/17 0522  WBC 16.6* 10.7* 8.6  NEUTROABS 13.9*  --   --   HGB 12.8* 11.9* 9.5*  HCT 39.0* 37.0* 28.4*  MCV 88.2 91.0 88.0  PLT 223 191 165    Cardiac Enzymes: Recent Labs  Lab 05/16/17 1300  TROPONINI 0.11*    BNP: Invalid input(s): POCBNP  CBG: Recent Labs  Lab 05/16/17 1243 05/17/17 0604  GLUCAP 113* 87    Microbiology: Results for orders placed or performed during the hospital encounter of 05/16/17  Culture, blood (Routine x 2)  Status: None (Preliminary result)   Collection Time: 05/16/17  9:29 AM  Result Value Ref Range Status   Specimen Description BLOOD LEFT FORARM  Final   Special Requests BOTTLES DRAWN AEROBIC AND ANAEROBIC [place]  Final   Culture NO GROWTH 2 DAYS  Final   Report Status PENDING  Incomplete  Culture, blood (Routine x 2)     Status: None (Preliminary result)   Collection Time: 05/16/17  9:29 AM  Result Value Ref Range Status   Specimen Description BLOOD RIGHT ARM  Final   Special Requests BOTTLES DRAWN AEROBIC AND ANAEROBIC  [place]  Final   Culture NO GROWTH 2 DAYS  Final   Report Status PENDING  Incomplete  MRSA PCR Screening     Status: Abnormal   Collection Time: 05/16/17  1:18 PM  Result Value Ref Range Status   MRSA by PCR POSITIVE (A) NEGATIVE Final    Comment:        The GeneXpert MRSA Assay (FDA approved for NASAL specimens only), is one component of a comprehensive MRSA colonization surveillance program. It is not intended to diagnose MRSA infection nor to guide or monitor treatment for MRSA infections. RESULT CALLED TO,  READ BACK BY AND VERIFIED WITH: JAKE ROBINSON 05/16/17 1731 KLW     Coagulation Studies: Recent Labs    05/16/17 0931  LABPROT 15.5*  INR 1.24    Urinalysis: Recent Labs    05/16/17 2029  COLORURINE YELLOW*  LABSPEC 1.020  PHURINE 5.0  GLUCOSEU NEGATIVE  HGBUR NEGATIVE  BILIRUBINUR NEGATIVE  KETONESUR NEGATIVE  PROTEINUR NEGATIVE  NITRITE NEGATIVE  LEUKOCYTESUR NEGATIVE      Imaging: Ct Angio Chest Pe W/cm &/or Wo Cm  Result Date: 05/16/2017 CLINICAL DATA:  Respiratory distress EXAM: CT ANGIOGRAPHY CHEST WITH CONTRAST TECHNIQUE: Multidetector CT imaging of the chest was performed using the standard protocol during bolus administration of intravenous contrast. Multiplanar CT image reconstructions and MIPs were obtained to evaluate the vascular anatomy. CONTRAST:  34m ISOVUE-370 IOPAMIDOL (ISOVUE-370) INJECTION 76% COMPARISON:  Chest x-ray earlier today.  Chest CT 11/21/2007 FINDINGS: Cardiovascular: Heart is normal size. Aorta is normal caliber. Scattered coronary artery and aortic calcifications. Pulmonary embolus noted within the left pulmonary artery extending into to segmental upper lobe branches, best seen on coronal images 48-52. No evidence of right heart strain Mediastinum/Nodes: No mediastinal, hilar, or axillary adenopathy. Lungs/Pleura: Airspace disease in both lower lobes concerning for pneumonia. Patchy airspace disease in the anterior right upper lobe and posterior lingula. Upper Abdomen: Multiple gallstones layering in the gallbladder. No acute findings. Musculoskeletal: Pacer in the left chest wall. No acute bony abnormality. Review of the MIP images confirms the above findings. IMPRESSION: Pulmonary emboli noted in the left upper lobe pulmonary artery extending into to segmental upper lobe branches. Bilateral lower lobe airspace consolidation. Patchy opacities in both upper lobes. Findings concerning for multifocal pneumonia. Coronary artery disease, aortic  atherosclerosis. Cholelithiasis. Electronically Signed   By: KRolm BaptiseM.D.   On: 05/16/2017 10:32   UKoreaVenous Img Lower Bilateral  Result Date: 05/16/2017 CLINICAL DATA:  Left leg swelling, shortness of breath, acute pulmonary emboli 1 EXAM: BILATERAL LOWER EXTREMITY VENOUS DOPPLER ULTRASOUND TECHNIQUE: Gray-scale sonography with graded compression, as well as color Doppler and duplex ultrasound were performed to evaluate the lower extremity deep venous systems from the level of the common femoral vein and including the common femoral, femoral, profunda femoral, popliteal and calf veins including the posterior tibial, peroneal and gastrocnemius veins when visible. The  superficial great saphenous vein was also interrogated. Spectral Doppler was utilized to evaluate flow at rest and with distal augmentation maneuvers in the common femoral, femoral and popliteal veins. COMPARISON:  07/31/2016 FINDINGS: RIGHT LOWER EXTREMITY Common Femoral Vein: No evidence of thrombus. Normal compressibility, respiratory phasicity and response to augmentation. Saphenofemoral Junction: No evidence of thrombus. Normal compressibility and flow on color Doppler imaging. Profunda Femoral Vein: No evidence of thrombus. Normal compressibility and flow on color Doppler imaging. Femoral Vein: No evidence of thrombus. Normal compressibility, respiratory phasicity and response to augmentation. Popliteal Vein: No evidence of thrombus. Normal compressibility, respiratory phasicity and response to augmentation. Calf Veins: Limited assessment of the calf veins. Superficial Great Saphenous Vein: No evidence of thrombus. Normal compressibility. Venous Reflux:  None. Other Findings:  None. LEFT LOWER EXTREMITY Common Femoral Vein: Acute hypoechoic intraluminal thrombus. Vessel is noncompressible. Femoral thrombus extends to the left iliac vein. Saphenofemoral Junction: Acute occlusive thrombus extends into the saphenous femoral junction.  Profunda Femoral Vein: Acute partially occlusive thrombus. Vessel this noncompressible. Minimal phasic flow detected. Femoral Vein: Acute hypoechoic occlusive thrombus throughout the femoral vein. Popliteal Vein: Acute hypoechoic nearly occlusive thrombus within the popliteal vein. Calf Veins: Hypoechoic thrombus appears to propagate into the calf peroneal and tibial veins. Superficial Great Saphenous Vein: Thrombus extends into the proximal left GSV. Venous Reflux:  None. Other Findings:  Subcutaneous edema noted. IMPRESSION: Extensive acute occlusive left lower extremity DVT involving the iliac, femoral, popliteal, and calf veins. Negative for right lower extremity DVT. These results will be called to the ordering clinician or representative by the Radiologist Assistant, and communication documented in the PACS or zVision Dashboard. Electronically Signed   By: Jerilynn Mages.  Shick M.D.   On: 05/16/2017 14:38   Dg Chest Port 1 View  Result Date: 05/17/2017 CLINICAL DATA:  PICC line placement EXAM: PORTABLE CHEST 1 VIEW COMPARISON:  05/17/2017 FINDINGS: Interval placement of RIGHT PICC line tip in distal SVC. RIGHT central venous line from IJ unchanged. Normal cardiac silhouette. Low lung volumes. There is mild central pulmonary edema pattern. No pneumothorax. IMPRESSION: RIGHT PICC line tip in distal SVC. Mild central pulmonary edema pattern. Electronically Signed   By: Suzy Bouchard M.D.   On: 05/17/2017 19:08   Dg Chest Port 1 View  Result Date: 05/17/2017 CLINICAL DATA:  Status post central line placement. Acute pulmonary emboli. History of COPD, former smoker. EXAM: PORTABLE CHEST 1 VIEW COMPARISON:  CT scan of the chest of May 16, 2017 FINDINGS: The lungs are mildly hypoinflated. The right internal jugular venous catheter tip projects at the cavoatrial junction. There is no postprocedure pneumothorax or hemothorax. The perihilar interstitial markings are increased. The heart is normal in size. There is  tortuosity of the ascending and descending thoracic aorta. The ICD is in stable position. IMPRESSION: No postprocedure complication following right internal jugular venous catheter placement whose tip projects at the cavoatrial junction. Patchy bilateral interstitial and alveolar opacities consistent with pneumonia. Electronically Signed   By: David  Martinique M.D.   On: 05/17/2017 13:40   Dg Chest Port 1 View  Result Date: 05/16/2017 CLINICAL DATA:  Respiratory distress.  Hypoxia.  COPD. EXAM: PORTABLE CHEST 1 VIEW COMPARISON:  04/12/2017 FINDINGS: There is a focal area of new increased density at the left lung base posterior medially. The lungs are otherwise clear. Heart size and vascularity are normal. Tortuosity and slight calcification of the thoracic aorta. No acute bone abnormality.  Pacemaker in place. IMPRESSION: Focal area of new consolidation at the  left lung base posterior medially could represent atelectasis or pneumonia. Aortic atherosclerosis. Electronically Signed   By: Lorriane Shire M.D.   On: 05/16/2017 10:00     Medications:   . ceFEPime (MAXIPIME) IV Stopped (05/18/17 1610)  . dextrose 5 % and 0.9% NaCl 50 mL/hr at 05/18/17 0925  . heparin Stopped (05/18/17 0900)  . pureflow    . vancomycin Stopped (05/17/17 1935)   . aspirin EC  81 mg Oral Daily  . budesonide (PULMICORT) nebulizer solution  0.25 mg Nebulization Q12H  . Chlorhexidine Gluconate Cloth  6 each Topical Q0600  . feeding supplement (ENSURE ENLIVE)  237 mL Oral TID BM  . fesoterodine  4 mg Oral Daily  . Gerhardt's butt cream   Topical BID  . ipratropium-albuterol  3 mL Nebulization Q6H  . latanoprost  1 drop Both Eyes QHS  . lidocaine  1 patch Transdermal Q24H  . mouth rinse  15 mL Mouth Rinse BID  . multivitamin  1 tablet Oral QHS  . mupirocin ointment  1 application Nasal BID  . pantoprazole (PROTONIX) IV  40 mg Intravenous Q12H  . sertraline  25 mg Oral Daily  . sodium chloride flush  10-40 mL  Intracatheter Q12H   acetaminophen **OR** acetaminophen, heparin, lidocaine, morphine injection, ondansetron **OR** ondansetron (ZOFRAN) IV, oxyCODONE, polyethylene glycol, sodium chloride flush  Assessment/ Plan:  83 y.o. male with a PMHx of anxiety, B12 deficiency, BPH, COPD, depression, hypertension, gout, chronic kidney disease stage III baseline creatinine 1.5 with an EGFR of 39, who was admitted to [facility] on 05/16/2017 for evaluation of respiratory distress.  Upon evaluation he did have a CT chest angiogram.    1.  Acute renal failure/chronic kidney disease stage III baseline creatinine 1.5, EGFR 39.  Patient has known existing chronic kidney disease.  He had recent rhabdomyolysis as well.  However suspect now that his acute renal failure is related to hypotension as well as contrast exposure upon admission.  -Patient has done well with continuous renal replacement therapy.  Making very little urine at the moment.  Continue CRRT at this time.  Consider transition to intermittent hemodialysis tomorrow if blood pressure is maintaining.  2.  Hyperkalemia.    Improved with CRRT.  Transition patient to a 4K bath.  3.  Metabolic acidosis.    Serum bicarbonate significantly improved to 22.  Continue to monitor.  4.  Hypotension.  Blood pressure significantly improved.  Continue to monitor blood pressure trend.     LOS: 2 Lexx [place] 12/13/20189:45 AM

## 2017-05-18 NOTE — Progress Notes (Signed)
ANTICOAGULATION CONSULT NOTE - Initial Consult  Pharmacy Consult for Heparin drip Indication: pulmonary embolus  Allergies  Allergen Reactions  . Colchicine   . Doxycycline Other (See Comments)  . Meloxicam Other (See Comments)  . Sulfa Antibiotics     Other reaction(s): UNKNOWN  . Tussin  [Guaifenesin] Other (See Comments)  . Amoxicillin-Pot Clavulanate Rash    Patient Measurements: Height: 5\' 10"  (177.8 cm) Weight: 172 lb 9.9 oz (78.3 kg) IBW/kg (Calculated) : 73 Heparin Dosing Weight: 74.8 kg   Vital Signs: Temp: 97.8 F (36.6 C) (12/13 1100) Temp Source: Axillary (12/13 1100) BP: 112/94 (12/13 1200) Pulse Rate: 103 (12/13 1200)  Labs: Recent Labs    05/16/17 0931 05/16/17 1300  05/17/17 0443  05/17/17 2112 05/18/17 0058 05/18/17 0522 05/18/17 0921  HGB 12.8*  --   --  11.9*  --   --   --  9.5*  --   HCT 39.0*  --   --  37.0*  --   --   --  28.4*  --   PLT 223  --   --  191  --   --   --  165  --   APTT 36  --   --   --   --   --   --  >160*  --   LABPROT 15.5*  --   --   --   --   --   --   --   --   INR 1.24  --   --   --   --   --   --   --   --   HEPARINUNFRC  --   --    < > 0.56  --  0.56  --  0.52  --   CREATININE 2.28*  --    < > 4.36*   < > 2.77* 2.21* 1.83* 1.46*  TROPONINI  --  0.11*  --   --   --   --   --   --   --    < > = values in this interval not displayed.    Estimated Creatinine Clearance: 37.5 mL/min (A) (by C-G formula based on SCr of 1.46 mg/dL (H)).   Medical History: Past Medical History:  Diagnosis Date  . Anxiety   . Atrioventricular block    s/p pacemaker placement  . B12 deficiency   . BPH with elevated PSA   . BPH with obstruction/lower urinary tract symptoms   . Chronic low back pain   . COPD (chronic obstructive pulmonary disease) (HCC)   . Depression   . Diverticulitis   . Gout   . Hypertension   . Lumbar disc disease   . Renal insufficiency   . Sensory loss   . Sleep apnea   . Trigeminal neuralgia   . Urge  incontinence     Medications:  Scheduled:  . aspirin EC  81 mg Oral Daily  . budesonide (PULMICORT) nebulizer solution  0.25 mg Nebulization Q12H  . Chlorhexidine Gluconate Cloth  6 each Topical Q0600  . feeding supplement (ENSURE ENLIVE)  237 mL Oral TID BM  . fesoterodine  4 mg Oral Daily  . Gerhardt's butt cream   Topical BID  . ipratropium-albuterol  3 mL Nebulization Q6H  . latanoprost  1 drop Both Eyes QHS  . lidocaine  1 patch Transdermal Q24H  . mouth rinse  15 mL Mouth Rinse BID  . multivitamin  1 tablet Oral QHS  .  mupirocin ointment  1 application Nasal BID  . pantoprazole (PROTONIX) IV  40 mg Intravenous Q12H  . sertraline  25 mg Oral Daily  . sodium chloride flush  10-40 mL Intracatheter Q12H   Infusions:  . ceFEPime (MAXIPIME) IV Stopped (05/18/17 16100623)  . dextrose 5 % and 0.9% NaCl 50 mL/hr at 05/18/17 0925  . heparin Stopped (05/18/17 0900)  . pureflow 3 each (05/18/17 0955)  . vancomycin Stopped (05/17/17 1935)    Assessment: 83 yo M with Pulmonary embolism, ?sepsis, to start Heparin Drip. Heparin paused for A-line insertion  Goal of Therapy:  Heparin level 0.3-0.7 units/ml Monitor platelets by anticoagulation protocol: Yes   Plan:  Heparin resumed at 1300 at previous rate. Will recheck a HL in 8 hours.   Luisa HartScott Uchechi Denison, PharmD Clinical Pharmacist  05/18/17 1:11 PM

## 2017-05-18 NOTE — Progress Notes (Signed)
Notified [name] NP of the patients elevated APTT >160. No signs of bleeding noted. No new orders received at this time.

## 2017-05-18 NOTE — Progress Notes (Signed)
This RN and 2nd RN at bedside assisting NP with radial arterial line insertion. No arterial line established at this time.

## 2017-05-18 NOTE — Progress Notes (Signed)
[name], NP notified of critical lactic acid level of 2.9. Acknowledged, no new orders received at this time.

## 2017-05-18 NOTE — Procedures (Signed)
Arterial Catheter Insertion Procedure Note Ronald Weber [id number] 02-Dec-1928  Procedure: Insertion of Arterial Catheter  Indications: Blood pressure monitoring  Procedure Details Consent: Risks of procedure as well as the alternatives and risks of each were explained to the patient's son.  Consent for procedure obtained. Time Out: Verified patient identification, verified procedure, site/side was marked, verified correct patient position, special equipment/implants available, medications/allergies/relevent history reviewed, required imaging and test results available.    Maximum sterile technique was used. Skin prep: Chlorhexidine; local anesthetic administered Ultrasound used.   The skin was prepped and draped in standard surgical fashion.  Under direct ultrasound guidance, the right femoral artery was identified.  Using direct ultrasound guidance, using Seldinger technique the right femoral artery was accessed in the first attempt.  Bright red pulsatile blood was obtained.  The guidewire was passed without meeting any resistance and the needle was removed.  A line was inserted over the guidewire into the right femoral artery and the guidewire was removed. Pulsatile bright red blood was obtained.  The A-line was connected to a transducer and good waveform was obtained. The arterial line was sutured in place.  Sterile dressing was applied. Patient tolerated the procedure well.  There were no complications.   Ronald Weber 05/18/2017

## 2017-05-18 NOTE — Progress Notes (Signed)
ANTICOAGULATION CONSULT NOTE - Initial Consult  Pharmacy Consult for Heparin drip Indication: pulmonary embolus  Allergies  Allergen Reactions  . Colchicine   . Doxycycline Other (See Comments)  . Meloxicam Other (See Comments)  . Sulfa Antibiotics     Other reaction(s): UNKNOWN  . Tussin  [Guaifenesin] Other (See Comments)  . Amoxicillin-Pot Clavulanate Rash    Patient Measurements: Height: 5\' 10"  (177.8 cm) Weight: 172 lb 9.9 oz (78.3 kg) IBW/kg (Calculated) : 73 Heparin Dosing Weight: 74.8 kg   Vital Signs: Temp: 97.8 F (36.6 C) (12/13 0357) Temp Source: Axillary (12/13 0357) BP: 101/61 (12/13 0530) Pulse Rate: 91 (12/13 0530)  Labs: Recent Labs    05/16/17 0931 05/16/17 1300  05/17/17 0443  05/17/17 2112 05/18/17 0058 05/18/17 0522  HGB 12.8*  --   --  11.9*  --   --   --  9.5*  HCT 39.0*  --   --  37.0*  --   --   --  28.4*  PLT 223  --   --  191  --   --   --  165  APTT 36  --   --   --   --   --   --  >160*  LABPROT 15.5*  --   --   --   --   --   --   --   INR 1.24  --   --   --   --   --   --   --   HEPARINUNFRC  --   --    < > 0.56  --  0.56  --  0.52  CREATININE 2.28*  --    < > 4.36*   < > 2.77* 2.21* 1.83*  TROPONINI  --  0.11*  --   --   --   --   --   --    < > = values in this interval not displayed.    Estimated Creatinine Clearance: 29.9 mL/min (A) (by C-G formula based on SCr of 1.83 mg/dL (H)).   Medical History: Past Medical History:  Diagnosis Date  . Anxiety   . Atrioventricular block    s/p pacemaker placement  . B12 deficiency   . BPH with elevated PSA   . BPH with obstruction/lower urinary tract symptoms   . Chronic low back pain   . COPD (chronic obstructive pulmonary disease) (HCC)   . Depression   . Diverticulitis   . Gout   . Hypertension   . Lumbar disc disease   . Renal insufficiency   . Sensory loss   . Sleep apnea   . Trigeminal neuralgia   . Urge incontinence     Medications:  Scheduled:  .  acetylcysteine  4 mL Nebulization Q6H  . aspirin EC  81 mg Oral Daily  . budesonide (PULMICORT) nebulizer solution  0.25 mg Nebulization Q12H  . Chlorhexidine Gluconate Cloth  6 each Topical Q0600  . feeding supplement (ENSURE ENLIVE)  237 mL Oral TID BM  . fesoterodine  4 mg Oral Daily  . Gerhardt's butt cream   Topical BID  . ipratropium-albuterol  3 mL Nebulization Q4H  . latanoprost  1 drop Both Eyes QHS  . lidocaine  1 patch Transdermal Q24H  . mouth rinse  15 mL Mouth Rinse BID  . multivitamin  1 tablet Oral QHS  . mupirocin ointment  1 application Nasal BID  . scopolamine  1 patch Transdermal Q72H  . sertraline  25 mg Oral Daily  . sodium chloride flush  10-40 mL Intracatheter Q12H  . sodium polystyrene  30 g Oral 1 day or 1 dose   Infusions:  . ceFEPime (MAXIPIME) IV Stopped (05/18/17 [id number])  . heparin 1,250 Units/hr (05/18/17 0445)  . pureflow 3 each (05/18/17 0538)  . vancomycin Stopped (05/17/17 1935)    Assessment: 83 yo M with Pulmonary embolism, ?sepsis, to start Heparin Drip. 12/12 heparin paused for vascath placement.   Goal of Therapy:  Heparin level 0.3-0.7 units/ml Monitor platelets by anticoagulation protocol: Yes   Plan:  Heparin infusion resumed at previous rate of 1250 units/hr at 1330. Will recheck a HL at 2130.   12/12: HL @ 2112 = 0.56 Will continue this pt on current rate and recheck HL on 12/13 @ 0500.   12/13 AM heparin level 0.52. Continue current regimen. Recheck heparin level and CBC with tomorrow AM labs.  12/13 0630 RN called to notify of APTT >160, no s/sx of bleeding.  Test ordered by nephrology. Since heparin level therapeutic and stable will continue heparin as is for now for PE indication. RN advised to discuss with nephrology/CCM to determine if further action is needed.  Fulton ReekMatt Cumi Sanagustin, PharmD, BCPS  05/18/17 6:36 AM

## 2017-05-18 NOTE — Progress Notes (Signed)
[employer] [facility] [place] [facility]   PATIENT NAME: Ronald Weber    MR#:  [id number]  DATE OF BIRTH:  1928-09-10  SUBJECTIVE:   -admitted with pneumonia, new DVT and PE admitted with hypoxic respiratory failure and sepsis. -Still has significant gurgling secretions. Off BiPAP and now on 4 L nasal cannula -Worsening renal failure and hyperkalemia -on CRRT  REVIEW OF SYSTEMS:  Review of Systems  Constitutional: Positive for malaise/fatigue. Negative for chills and fever.  HENT: Negative for congestion, ear discharge, hearing loss and nosebleeds.   Eyes: Negative for blurred vision and double vision.  Respiratory: Positive for cough, shortness of breath and wheezing.   Cardiovascular: Negative for chest pain and palpitations.  Gastrointestinal: Negative for abdominal pain, constipation, diarrhea, nausea and vomiting.  Genitourinary: Negative for dysuria.  Musculoskeletal: Positive for myalgias.  Neurological: Negative for dizziness, speech change, focal weakness, seizures and headaches.  Psychiatric/Behavioral: Negative for depression.    DRUG ALLERGIES:   Allergies  Allergen Reactions  . Colchicine   . Doxycycline Other (See Comments)  . Meloxicam Other (See Comments)  . Sulfa Antibiotics     Other reaction(s): UNKNOWN  . Tussin  [Guaifenesin] Other (See Comments)  . Amoxicillin-Pot Clavulanate Rash    VITALS:  Blood pressure 114/61, pulse 83, temperature 97.8 F (36.6 C), temperature source Axillary, resp. rate 15, height 5\' 10"  (1.778 m), weight 78.3 kg (172 lb 9.9 oz), SpO2 97 %.  PHYSICAL EXAMINATION:  Physical Exam  GENERAL:  83 y.o.-year-old patient lying in the bed, gurgling upper airway secretions while talking.  EYES: Pupils equal, round, reactive to light and accommodation. No scleral icterus. Extraocular muscles intact.  HEENT: Head atraumatic, normocephalic. Oropharynx and nasopharynx clear.  NECK:  Supple, no jugular venous distention.  No thyroid enlargement, no tenderness.  LUNGS: gurgling upper airway secretions, however normal bilateral breath sounds.  no wheezing, rales,rhonchi or crepitation. No use of accessory muscles of respiration.  CARDIOVASCULAR: S1, S2 normal. Norubs, or gallops. 3/6 systolic murmur present ABDOMEN: Soft, nontender, nondistended. Bowel sounds present. No organomegaly or mass.  EXTREMITIES: No cyanosis, or clubbing. 2+ edema of the left lower extremity NEUROLOGIC: Cranial nerves II through XII are intact. Muscle strength 5/5 in all extremities. Sensation intact. Gait not checked. Global weakness noted PSYCHIATRIC: The patient is alert and oriented x 3.  SKIN: No obvious rash, lesion, or ulcer.    LABORATORY PANEL:   CBC Recent Labs  Lab 05/18/17 0522 05/18/17 1300  WBC 8.6  --   HGB 9.5* 9.1*  HCT 28.4* 27.5*  PLT 165  --    ------------------------------------------------------------------------------------------------------------------  Chemistries  Recent Labs  Lab 05/16/17 0931  05/18/17 0921 05/18/17 1300  NA 139   < > 134* 135  K 2.7*   < > 3.6 4.0  CL 121*   < > 101 105  CO2 9*   < > 22 22  GLUCOSE 56*   < > 136* 90  BUN 34*   < > 25* 23*  CREATININE 2.28*   < > 1.46* 1.41*  CALCIUM <4.0*   < > 7.9* 8.0*  MG  --    < > 1.9  --   AST 71*  --   --   --   ALT 57  --   --   --   ALKPHOS 54  --   --   --   BILITOT 0.7  --   --   --    < > =  values in this interval not displayed.   ------------------------------------------------------------------------------------------------------------------  Cardiac Enzymes Recent Labs  Lab 05/16/17 1300  TROPONINI 0.11*   ------------------------------------------------------------------------------------------------------------------  RADIOLOGY:  [place] Venous Img Lower Bilateral  Result Date: 05/16/2017 CLINICAL DATA:  Left leg swelling, shortness of breath, acute pulmonary emboli 1 EXAM: BILATERAL LOWER EXTREMITY VENOUS  DOPPLER ULTRASOUND TECHNIQUE: Gray-scale sonography with graded compression, as well as color Doppler and duplex ultrasound were performed to evaluate the lower extremity deep venous systems from the level of the common femoral vein and including the common femoral, femoral, profunda femoral, popliteal and calf veins including the posterior tibial, peroneal and gastrocnemius veins when visible. The superficial great saphenous vein was also interrogated. Spectral Doppler was utilized to evaluate flow at rest and with distal augmentation maneuvers in the common femoral, femoral and popliteal veins. COMPARISON:  07/31/2016 FINDINGS: RIGHT LOWER EXTREMITY Common Femoral Vein: No evidence of thrombus. Normal compressibility, respiratory phasicity and response to augmentation. Saphenofemoral Junction: No evidence of thrombus. Normal compressibility and flow on color Doppler imaging. Profunda Femoral Vein: No evidence of thrombus. Normal compressibility and flow on color Doppler imaging. Femoral Vein: No evidence of thrombus. Normal compressibility, respiratory phasicity and response to augmentation. Popliteal Vein: No evidence of thrombus. Normal compressibility, respiratory phasicity and response to augmentation. Calf Veins: Limited assessment of the calf veins. Superficial Great Saphenous Vein: No evidence of thrombus. Normal compressibility. Venous Reflux:  None. Other Findings:  None. LEFT LOWER EXTREMITY Common Femoral Vein: Acute hypoechoic intraluminal thrombus. Vessel is noncompressible. Femoral thrombus extends to the left iliac vein. Saphenofemoral Junction: Acute occlusive thrombus extends into the saphenous femoral junction. Profunda Femoral Vein: Acute partially occlusive thrombus. Vessel this noncompressible. Minimal phasic flow detected. Femoral Vein: Acute hypoechoic occlusive thrombus throughout the femoral vein. Popliteal Vein: Acute hypoechoic nearly occlusive thrombus within the popliteal vein. Calf  Veins: Hypoechoic thrombus appears to propagate into the calf peroneal and tibial veins. Superficial Great Saphenous Vein: Thrombus extends into the proximal left GSV. Venous Reflux:  None. Other Findings:  Subcutaneous edema noted. IMPRESSION: Extensive acute occlusive left lower extremity DVT involving the iliac, femoral, popliteal, and calf veins. Negative for right lower extremity DVT. These results will be called to the ordering clinician or representative by the Radiologist Assistant, and communication documented in the PACS or zVision Dashboard. Electronically Signed   By: Judie PetitM.  Shick M.D.   On: 05/16/2017 14:38   Dg Chest Port 1 View  Result Date: 05/17/2017 CLINICAL DATA:  PICC line placement EXAM: PORTABLE CHEST 1 VIEW COMPARISON:  05/17/2017 FINDINGS: Interval placement of RIGHT PICC line tip in distal SVC. RIGHT central venous line from IJ unchanged. Normal cardiac silhouette. Low lung volumes. There is mild central pulmonary edema pattern. No pneumothorax. IMPRESSION: RIGHT PICC line tip in distal SVC. Mild central pulmonary edema pattern. Electronically Signed   By: Genevive BiStewart  Edmunds M.D.   On: 05/17/2017 19:08   Dg Chest Port 1 View  Result Date: 05/17/2017 CLINICAL DATA:  Status post central line placement. Acute pulmonary emboli. History of COPD, former smoker. EXAM: PORTABLE CHEST 1 VIEW COMPARISON:  CT scan of the chest of May 16, 2017 FINDINGS: The lungs are mildly hypoinflated. The right internal jugular venous catheter tip projects at the cavoatrial junction. There is no postprocedure pneumothorax or hemothorax. The perihilar interstitial markings are increased. The heart is normal in size. There is tortuosity of the ascending and descending thoracic aorta. The ICD is in stable position. IMPRESSION: No postprocedure complication following right internal jugular venous catheter  placement whose tip projects at the cavoatrial junction. Patchy bilateral interstitial and alveolar  opacities consistent with pneumonia. Electronically Signed   By: David  SwazilandJordan M.D.   On: 05/17/2017 13:40    EKG:   Orders placed or performed during the hospital encounter of 05/16/17  . EKG 12-Lead  . EKG 12-Lead    ASSESSMENT AND PLAN:   83 year old male with past medical history significant for complete heart block status post pacemaker, anxiety and depression, hypertension, COPD, arthritis, recent admission for traumatic rhabdomyolysis currently from assisted living facility came in with shortness of breath and noted to have PE and pneumonia  1. Septic shock-secondary to multifocal pneumonia. -Blood pressure and tachycardia improved. CT of the chest consistent with multifocal pneumonia. -Appreciate pulmonary intensivist consult -Currently on vancomycin and cefepime. -Follow blood cultures are negative  -MRSA PCR +  2. Acute renal failure with hyperkalemia- ATN secondary to hypotension and sepsis -patient got replaced for hypokalemia yesterday, however hyperkalemia due to acute renal failure today. -Received Kayexalate. Continue to monitor urine output. Oliguric at this time -Now on CRRT -appreicated nephrology consultation   3. Acute hypoxic respiratory failure-secondary to multifocal pneumonia and pulmonary emboli -Off BiPAP and currently requiring 4 L nasal cannula -Still has significant gurgling secretions. Encourage Mucinex, chest physical therapy and spirometer  4. DVT and PE-recent immobilization from fall and rehabilitation. -Left lower extremity DVT, but due to acute illness-not a candidate for thrombolysis. Appreciate vascular consult. -No right heart strain. Continue heparin drip and transitioned to oral anticoagulation 1 more stable.  5. DVT prophylaxis-already on IV heparin drip  CODE STATUS-DNR  All the records are reviewed and case discussed with Care Management/Social Workerr. Management plans discussed with the patient, family and they are in  agreement.  CODE STATUS: DNR  TOTAL CRITICAL CARE TIME SPENT IN TAKING CARE OF THIS PATIENT: 39 minutes.   POSSIBLE D/C IN few DAYS, DEPENDING ON CLINICAL CONDITION.   Enedina FinnerSona Yoshie Kosel M.D on 05/18/2017 at 1:54 PM  Between 7am to 6pm - Pager - [phone]      After 6pm go to [email or web address] - Social research officer, governmentpassword EPAS [facility]  [facility]  Office  [phone]  CC: Primary care physician; Lauro RegulusAnderson, Marshall W, MD

## 2017-05-19 DIAGNOSIS — R609 Edema, unspecified: Secondary | ICD-10-CM

## 2017-05-19 DIAGNOSIS — J189 Pneumonia, unspecified organism: Secondary | ICD-10-CM

## 2017-05-19 DIAGNOSIS — Z452 Encounter for adjustment and management of vascular access device: Secondary | ICD-10-CM

## 2017-05-19 LAB — RENAL FUNCTION PANEL
ALBUMIN: 1.8 g/dL — AB (ref 3.5–5.0)
ALBUMIN: 1.9 g/dL — AB (ref 3.5–5.0)
ANION GAP: 7 (ref 5–15)
Anion gap: 7 (ref 5–15)
BUN: 16 mg/dL (ref 6–20)
BUN: 14 mg/dL (ref 6–20)
CALCIUM: 7.8 mg/dL — AB (ref 8.9–10.3)
CO2: 26 mmol/L (ref 22–32)
CO2: 25 mmol/L (ref 22–32)
CREATININE: 0.79 mg/dL (ref 0.61–1.24)
Calcium: 7.9 mg/dL — ABNORMAL LOW (ref 8.9–10.3)
Chloride: 103 mmol/L (ref 101–111)
Chloride: 103 mmol/L (ref 101–111)
Creatinine, Ser: 0.78 mg/dL (ref 0.61–1.24)
GFR calc Af Amer: >60 mL/min (ref >60)
GFR calc Af Amer: >60 mL/min (ref >60)
Glucose, Bld: 125 mg/dL — ABNORMAL HIGH (ref 65–99)
Glucose, Bld: 115 mg/dL — ABNORMAL HIGH (ref 65–99)
PHOSPHORUS: 2.7 mg/dL (ref 2.5–4.6)
PHOSPHORUS: 3.1 mg/dL (ref 2.5–4.6)
POTASSIUM: 3.6 mmol/L (ref 3.5–5.1)
POTASSIUM: 3.7 mmol/L (ref 3.5–5.1)
SODIUM: 135 mmol/L (ref 135–145)
Sodium: 136 mmol/L (ref 135–145)

## 2017-05-19 LAB — MAGNESIUM
MAGNESIUM: 2.1 mg/dL (ref 1.7–2.4)
MAGNESIUM: 1.7 mg/dL (ref 1.7–2.4)

## 2017-05-19 LAB — APTT: aPTT: >160 seconds (ref 24–36)

## 2017-05-19 LAB — CBC
HCT: 27.4 % — ABNORMAL LOW (ref 40.0–52.0)
HEMOGLOBIN: 9.1 g/dL — AB (ref 13.0–18.0)
MCH: 29.1 pg (ref 26.0–34.0)
MCHC: 33.1 g/dL (ref 32.0–36.0)
MCV: 88 fL (ref 80.0–100.0)
PLATELETS: 165 10*3/uL (ref 150–440)
RBC: 3.11 MIL/uL — ABNORMAL LOW (ref 4.40–5.90)
RDW: 15 % — AB (ref 11.5–14.5)
WBC: 11 10*3/uL — ABNORMAL HIGH (ref 3.8–10.6)

## 2017-05-19 LAB — GLUCOSE, CAPILLARY: GLUCOSE-CAPILLARY: 112 mg/dL — AB (ref 65–99)

## 2017-05-19 LAB — HEPARIN LEVEL (UNFRACTIONATED): Heparin Unfractionated: 0.48 IU/mL (ref 0.30–0.70)

## 2017-05-19 MED ORDER — HALOPERIDOL 0.5 MG PO TABS
0.5000 mg | ORAL_TABLET | ORAL | Status: DC | PRN
  Filled 2017-05-19: qty 1

## 2017-05-19 MED ORDER — OXYCODONE HCL 5 MG/5ML PO SOLN
5.0000 mg | ORAL | Status: DC | PRN
  Administered 2017-05-21: 5 mg via ORAL
  Filled 2017-05-19 (×2): qty 5

## 2017-05-19 MED ORDER — GLYCOPYRROLATE 1 MG PO TABS
1.0000 mg | ORAL_TABLET | ORAL | Status: DC | PRN
  Filled 2017-05-19 (×2): qty 1

## 2017-05-19 MED ORDER — POLYVINYL ALCOHOL 1.4 % OP SOLN
1.0000 [drp] | Freq: Four times a day (QID) | OPHTHALMIC | Status: DC | PRN
  Filled 2017-05-19: qty 15

## 2017-05-19 MED ORDER — LORAZEPAM 1 MG PO TABS: 1.0000 mg | ORAL_TABLET | ORAL | Status: DC | PRN

## 2017-05-19 MED ORDER — ACETAMINOPHEN 650 MG RE SUPP: 650.0000 mg | Freq: Four times a day (QID) | RECTAL | Status: DC | PRN

## 2017-05-19 MED ORDER — ACETAMINOPHEN 325 MG PO TABS: 650.0000 mg | ORAL_TABLET | Freq: Four times a day (QID) | ORAL | Status: DC | PRN

## 2017-05-19 MED ORDER — GLYCOPYRROLATE 0.2 MG/ML IJ SOLN
0.2000 mg | INTRAMUSCULAR | Status: DC | PRN
  Administered 2017-05-19 – 2017-05-21 (×9): 0.2 mg via INTRAVENOUS
  Filled 2017-05-19 (×12): qty 1

## 2017-05-19 MED ORDER — ONDANSETRON 4 MG PO TBDP
4.0000 mg | ORAL_TABLET | Freq: Four times a day (QID) | ORAL | Status: DC | PRN
  Filled 2017-05-19: qty 1

## 2017-05-19 MED ORDER — ONDANSETRON HCL 4 MG/2ML IJ SOLN: 4.0000 mg | Freq: Four times a day (QID) | INTRAMUSCULAR | Status: DC | PRN

## 2017-05-19 MED ORDER — FENTANYL CITRATE (PF) 100 MCG/2ML IJ SOLN
50.0000 ug | INTRAMUSCULAR | Status: DC | PRN
  Administered 2017-05-19 (×2): 50 ug via INTRAVENOUS
  Filled 2017-05-19: qty 2

## 2017-05-19 MED ORDER — GLYCOPYRROLATE 0.2 MG/ML IJ SOLN
0.2000 mg | INTRAMUSCULAR | Status: DC | PRN
  Filled 2017-05-19 (×2): qty 1

## 2017-05-19 MED ORDER — LORAZEPAM 2 MG/ML IJ SOLN
1.0000 mg | INTRAMUSCULAR | Status: DC | PRN
  Administered 2017-05-20 – 2017-05-22 (×5): 1 mg via INTRAVENOUS
  Filled 2017-05-19 (×5): qty 1

## 2017-05-19 MED ORDER — HALOPERIDOL LACTATE 2 MG/ML PO CONC
0.5000 mg | ORAL | Status: DC | PRN
  Filled 2017-05-19: qty 0.3

## 2017-05-19 MED ORDER — LORAZEPAM 2 MG/ML PO CONC: 1.0000 mg | ORAL | Status: DC | PRN

## 2017-05-19 MED ORDER — BIOTENE DRY MOUTH MT LIQD: 15.0000 mL | OROMUCOSAL | Status: DC | PRN

## 2017-05-19 MED ORDER — ALBUTEROL SULFATE (2.5 MG/3ML) 0.083% IN NEBU: 2.5000 mg | INHALATION_SOLUTION | RESPIRATORY_TRACT | Status: DC | PRN

## 2017-05-19 MED ORDER — HALOPERIDOL LACTATE 5 MG/ML IJ SOLN: 0.5000 mg | INTRAMUSCULAR | Status: DC | PRN

## 2017-05-19 MED ORDER — OXYCODONE HCL 20 MG/ML PO CONC: 5.0000 mg | ORAL | Status: DC | PRN

## 2017-05-19 MED ORDER — FENTANYL CITRATE (PF) 100 MCG/2ML IJ SOLN
25.0000 ug | INTRAMUSCULAR | Status: DC | PRN
  Administered 2017-05-19 – 2017-05-20 (×9): 25 ug via INTRAVENOUS
  Filled 2017-05-19 (×10): qty 2

## 2017-05-19 MED ORDER — SCOPOLAMINE 1 MG/3DAYS TD PT72
1.0000 | MEDICATED_PATCH | TRANSDERMAL | Status: DC
  Administered 2017-05-19 – 2017-05-21 (×2): 1.5 mg via TRANSDERMAL
  Filled 2017-05-19 (×3): qty 1

## 2017-05-19 NOTE — Progress Notes (Signed)
[facility]  ROUNDING NOTE   Subjective:  Patient remains critically ill. He remains on CRRT. Son at bedside. No significant urine output over the preceding 24 hours. We discussed switching the patient to intermittent hemodialysis today.  Objective:  Vital signs in last 24 hours:  Temp:  [97.3 F (36.3 C)-97.9 F (36.6 C)] 97.3 F (36.3 C) (12/14 0730) Pulse Rate:  [57-121] 73 (12/14 0800) Resp:  [15-26] 19 (12/14 0800) BP: (89-132)/(56-102) 119/65 (12/14 0800) SpO2:  [94 %-100 %] 98 % (12/14 0800) Arterial Line BP: (104-156)/(44-68) 134/49 (12/14 0800) Weight:  [78.2 kg (172 lb 6.4 oz)] 78.2 kg (172 lb 6.4 oz) (12/14 0500)  Weight change: 3.7 kg (8 lb 2.5 oz) Filed Weights   05/17/17 1500 05/18/17 0500 05/19/17 0500  Weight: 74.5 kg (164 lb 3.9 oz) 78.3 kg (172 lb 9.9 oz) 78.2 kg (172 lb 6.4 oz)    Intake/Output: I/O last 3 completed shifts: In: 3239.6 [I.V.:1497.9; IV Piggyback:1741.7] Out: 601 [Urine:300; Stool:301]   Intake/Output this shift:  No intake/output data recorded.  Physical Exam: General: Critically ill appearing  Head: Normocephalic, atraumatic. Moist oral mucosal membranes  Eyes: Anicteric  Neck: Supple, trachea midline  Lungs:  Scattered rhonchi, normal effort  Heart: S1S2 paced rhythm  Abdomen:  Soft, nontender, bowel sounds present  Extremities: L > R LE edema  Neurologic: lethargic  Skin: No lesions  Access: R IJ temporary dialysis catheter    Basic Metabolic Panel: Recent Labs  Lab 05/18/17 1300 05/18/17 1659 05/18/17 2043 05/19/17 0037 05/19/17 0419  NA 135 135 135 136 135  K 4.0 3.4* 3.4* 3.6 3.7  CL 105 104 104 103 103  CO2 '22 23 23 26 25  '$ GLUCOSE 90 93 100* 125* 115*  BUN 23* '20 18 16 14  '$ CREATININE 1.41* 1.07 0.91 0.79 0.78  CALCIUM 8.0* 7.9* 7.8* 7.9* 7.8*  MG 1.8 1.7 1.6* 2.1 1.7  PHOS 3.1 2.4* 2.2* 2.7 3.1    Liver Function Tests: Recent Labs  Lab 05/16/17 0931  05/18/17 1300 05/18/17 1659  05/18/17 2043 05/19/17 0037 05/19/17 0419  AST 71*  --   --   --   --   --   --   ALT 57  --   --   --   --   --   --   ALKPHOS 54  --   --   --   --   --   --   BILITOT 0.7  --   --   --   --   --   --   PROT <3.0*  --   --   --   --   --   --   ALBUMIN <1.0*   < > 2.0* 1.8* 1.8* 1.8* 1.9*   < > = values in this interval not displayed.   No results for input(s): LIPASE, AMYLASE in the last 168 hours. No results for input(s): AMMONIA in the last 168 hours.  CBC: Recent Labs  Lab 05/16/17 0931 05/17/17 0443 05/18/17 0522 05/18/17 1300 05/19/17 0419  WBC 16.6* 10.7* 8.6  --  11.0*  NEUTROABS 13.9*  --   --   --   --   HGB 12.8* 11.9* 9.5* 9.1* 9.1*  HCT 39.0* 37.0* 28.4* 27.5* 27.4*  MCV 88.2 91.0 88.0  --  88.0  PLT 223 191 165  --  165    Cardiac Enzymes: Recent Labs  Lab 05/16/17 1300  TROPONINI 0.11*    BNP:  Invalid input(s): POCBNP  CBG: Recent Labs  Lab 05/17/17 0604 05/18/17 1302 05/18/17 1809 05/18/17 2324 05/19/17 0620  GLUCAP 70 71 93 103* 112*    Microbiology: Results for orders placed or performed during the hospital encounter of 05/16/17  Culture, blood (Routine x 2)     Status: None (Preliminary result)   Collection Time: 05/16/17  9:29 AM  Result Value Ref Range Status   Specimen Description BLOOD LEFT FORARM  Final   Special Requests BOTTLES DRAWN AEROBIC AND ANAEROBIC [place]  Final   Culture NO GROWTH 3 DAYS  Final   Report Status PENDING  Incomplete  Culture, blood (Routine x 2)     Status: None (Preliminary result)   Collection Time: 05/16/17  9:29 AM  Result Value Ref Range Status   Specimen Description BLOOD RIGHT ARM  Final   Special Requests BOTTLES DRAWN AEROBIC AND ANAEROBIC  [place]  Final   Culture NO GROWTH 3 DAYS  Final   Report Status PENDING  Incomplete  MRSA PCR Screening     Status: Abnormal   Collection Time: 05/16/17  1:18 PM  Result Value Ref Range Status   MRSA by PCR POSITIVE (A) NEGATIVE Final    Comment:         The GeneXpert MRSA Assay (FDA approved for NASAL specimens only), is one component of a comprehensive MRSA colonization surveillance program. It is not intended to diagnose MRSA infection nor to guide or monitor treatment for MRSA infections. RESULT CALLED TO, READ BACK BY AND VERIFIED WITH: JAKE ROBINSON 05/16/17 1731 KLW     Coagulation Studies: No results for input(s): LABPROT, INR in the last 72 hours.  Urinalysis: Recent Labs    05/16/17 2029  COLORURINE YELLOW*  LABSPEC 1.020  PHURINE 5.0  GLUCOSEU NEGATIVE  HGBUR NEGATIVE  BILIRUBINUR NEGATIVE  KETONESUR NEGATIVE  PROTEINUR NEGATIVE  NITRITE NEGATIVE  LEUKOCYTESUR NEGATIVE      Imaging: Dg Chest Port 1 View  Result Date: 05/17/2017 CLINICAL DATA:  PICC line placement EXAM: PORTABLE CHEST 1 VIEW COMPARISON:  05/17/2017 FINDINGS: Interval placement of RIGHT PICC line tip in distal SVC. RIGHT central venous line from IJ unchanged. Normal cardiac silhouette. Low lung volumes. There is mild central pulmonary edema pattern. No pneumothorax. IMPRESSION: RIGHT PICC line tip in distal SVC. Mild central pulmonary edema pattern. Electronically Signed   By: Suzy Bouchard M.D.   On: 05/17/2017 19:08   Dg Chest Port 1 View  Result Date: 05/17/2017 CLINICAL DATA:  Status post central line placement. Acute pulmonary emboli. History of COPD, former smoker. EXAM: PORTABLE CHEST 1 VIEW COMPARISON:  CT scan of the chest of May 16, 2017 FINDINGS: The lungs are mildly hypoinflated. The right internal jugular venous catheter tip projects at the cavoatrial junction. There is no postprocedure pneumothorax or hemothorax. The perihilar interstitial markings are increased. The heart is normal in size. There is tortuosity of the ascending and descending thoracic aorta. The ICD is in stable position. IMPRESSION: No postprocedure complication following right internal jugular venous catheter placement whose tip projects at the cavoatrial  junction. Patchy bilateral interstitial and alveolar opacities consistent with pneumonia. Electronically Signed   By: David  Martinique M.D.   On: 05/17/2017 13:40     Medications:   . ceFEPime (MAXIPIME) IV Stopped (05/19/17 0160)  . dextrose 5 % and 0.9% NaCl 50 mL/hr at 05/18/17 0925  . heparin 1,250 Units/hr (05/18/17 1930)  . pureflow 3 each (05/18/17 2100)  . vancomycin Stopped (05/18/17 1840)   .  aspirin EC  81 mg Oral Daily  . budesonide (PULMICORT) nebulizer solution  0.25 mg Nebulization Q12H  . Chlorhexidine Gluconate Cloth  6 each Topical Q0600  . feeding supplement (ENSURE ENLIVE)  237 mL Oral TID BM  . fesoterodine  4 mg Oral Daily  . Gerhardt's butt cream   Topical BID  . ipratropium-albuterol  3 mL Nebulization Q6H  . latanoprost  1 drop Both Eyes QHS  . lidocaine  1 patch Transdermal Q24H  . mouth rinse  15 mL Mouth Rinse BID  . multivitamin  1 tablet Oral QHS  . mupirocin ointment  1 application Nasal BID  . pantoprazole (PROTONIX) IV  40 mg Intravenous Q12H  . sertraline  25 mg Oral Daily  . sodium chloride flush  10-40 mL Intracatheter Q12H   acetaminophen **OR** acetaminophen, heparin, lidocaine, morphine injection, ondansetron **OR** ondansetron (ZOFRAN) IV, oxyCODONE, polyethylene glycol, sodium chloride flush  Assessment/ Plan:  83 y.o. male with a PMHx of anxiety, B12 deficiency, BPH, COPD, depression, hypertension, gout, chronic kidney disease stage III baseline creatinine 1.5 with an EGFR of 39, who was admitted to [facility] on 05/16/2017 for evaluation of respiratory distress.  Upon evaluation he did have a CT chest angiogram.    1.  Acute renal failure/chronic kidney disease stage III baseline creatinine 1.5, EGFR 39.  Patient has known existing chronic kidney disease.  He had recent rhabdomyolysis as well.  However suspect now that his acute renal failure is related to hypotension as well as contrast exposure upon admission.  -Since the patient is now off of  pressor therapy we will go ahead and discontinue CRRT and start him on intermittent hemodialysis tomorrow.  We had a long discussion with the patient's son.  Outpatient dialysis would prove difficult for the patient given his extensive debility now.  2.  Hyperkalemia.    Potassium down to 3.7.  Continue to monitor.  3.  Metabolic acidosis.    Acidosis improved.  Serum bicarbonate 25 at the moment.  4.  Hypotension.  Patient now off of pressors.    LOS: 3 Mitul Hallowell 12/14/20189:41 AM

## 2017-05-19 NOTE — Progress Notes (Signed)
CRRT treatment discontinued at 10:34 per MD order. Wash back administered and Pt disconnected from CRRT machine without complication or issues.   Pt to transition to comfort care later today.  VSS, this RN will continue to monitor and assess.

## 2017-05-19 NOTE — Progress Notes (Signed)
Notified [name] NP of critical lab value called on APTT >160. No new orders at this time.

## 2017-05-19 NOTE — Progress Notes (Signed)
Notified by the patient's son that everyone in the family has visited the patient and we can proceed with transitioning to full comfort care to allow natural death.  Will place the necessary orders.

## 2017-05-19 NOTE — Care Management (Addendum)
[facility] notified of patient transition to comfort care today. ICU nurse called RNCM regarding questions that son has regarding  [facility]. I recommended that [facility] reach [facility] to [facility] to [facility]. RN is not comfortable delivering that information; I advised her to contact CSW to help with this arrangement as she may know "who" to contact at facility regarding this concern.

## 2017-05-19 NOTE — Progress Notes (Signed)
[facility] - [place] at [facility]   PATIENT NAME: Ronald Weber    MR#:  [id number]  DATE OF BIRTH:  01-Apr-1929  SUBJECTIVE:   -admitted with pneumonia, new DVT and PE admitted with hypoxic respiratory failure and sepsis. A lot of family visiting today.  Patient is made comfort care given his poor prognosis and declining medical condition  REVIEW OF SYSTEMS:  Review of Systems  Unable to perform ROS: Medical condition    DRUG ALLERGIES:   Allergies  Allergen Reactions  . Colchicine   . Doxycycline Other (See Comments)  . Meloxicam Other (See Comments)  . Sulfa Antibiotics     Other reaction(s): UNKNOWN  . Tussin  [Guaifenesin] Other (See Comments)  . Amoxicillin-Pot Clavulanate Rash    VITALS:  Blood pressure 110/68, pulse 88, temperature 98.1 F (36.7 C), temperature source Axillary, resp. rate (!) 26, height 5\' 10"  (1.778 m), weight 78.2 kg (172 lb 6.4 oz), SpO2 92 %.  PHYSICAL EXAMINATION:  Physical Exam Limited exam secondary to patient being now comfort care GENERAL:  83 y.o.-year-old patient lying in the bed, gurgling upper airway secretions while talking.  EYES: Pupils equal, round, reactive to light and accommodation. No scleral icterus.HEENT: Head atraumatic, normocephalic. Oropharynx and nasopharynx clear.  NECK:  Supple, no jugular venous distention. No thyroid enlargement, no tenderness.  LUNGS: gurgling upper airway secretions, however normal bilateral breath sounds.  no wheezing, rales,rhonchi or crepitation. No use of accessory muscles of respiration.  CARDIOVASCULAR: S1, S2 normal. Norubs, or gallops. 3/6 systolic murmur present ABDOMEN: Soft, nontender, nondistended. Bowel sounds present. No organomegaly or mass.  EXTREMITIES: No cyanosis, or clubbing. 2+ edema of the left lower extremity SKIN: No obvious rash, lesion, or ulcer.    LABORATORY PANEL:   CBC Recent Labs  Lab 05/19/17 0419  WBC 11.0*  HGB 9.1*  HCT 27.4*  PLT 165    ------------------------------------------------------------------------------------------------------------------  Chemistries  Recent Labs  Lab 05/16/17 0931  05/19/17 0419  NA 139   < > 135  K 2.7*   < > 3.7  CL 121*   < > 103  CO2 9*   < > 25  GLUCOSE 56*   < > 115*  BUN 34*   < > 14  CREATININE 2.28*   < > 0.78  CALCIUM <4.0*   < > 7.8*  MG  --    < > 1.7  AST 71*  --   --   ALT 57  --   --   ALKPHOS 54  --   --   BILITOT 0.7  --   --    < > = values in this interval not displayed.   ------------------------------------------------------------------------------------------------------------------  Cardiac Enzymes Recent Labs  Lab 05/16/17 1300  TROPONINI 0.11*   ------------------------------------------------------------------------------------------------------------------  RADIOLOGY:  Dg Chest Port 1 View  Result Date: 05/17/2017 CLINICAL DATA:  PICC line placement EXAM: PORTABLE CHEST 1 VIEW COMPARISON:  05/17/2017 FINDINGS: Interval placement of RIGHT PICC line tip in distal SVC. RIGHT central venous line from IJ unchanged. Normal cardiac silhouette. Low lung volumes. There is mild central pulmonary edema pattern. No pneumothorax. IMPRESSION: RIGHT PICC line tip in distal SVC. Mild central pulmonary edema pattern. Electronically Signed   By: Genevive BiStewart  Edmunds M.D.   On: 05/17/2017 19:08    EKG:   Orders placed or performed during the hospital encounter of 05/16/17  . EKG 12-Lead  . EKG 12-Lead    ASSESSMENT AND PLAN:   83 year old male with  past medical history significant for complete heart block status post pacemaker, anxiety and depression, hypertension, COPD, arthritis, recent admission for traumatic rhabdomyolysis currently from assisted living facility came in with shortness of breath and noted to have PE and pneumonia  1. Septic shock-secondary to multifocal pneumonia.  2. Acute renal failure with hyperkalemia- ATN secondary to hypotension and  sepsis -Patient was on CRRT now discontinued since he is comfort care  3. Acute hypoxic respiratory failure-secondary to multifocal pneumonia and pulmonary emboli -Off BiPAP and currently requiring 4 L nasal cannula -Continue symptomatic management  4. DVT and PE-recent immobilization from fall and rehabilitation. -Left lower extremity DVT, but due to acute illness-not a candidate for thrombolysis. Appreciate vascular consult.  Palliative care consultation was made.  After lengthy discussion with family I's and ICU attending decision was made towards transition to comfort care CODE STATUS-DNR  All the records are reviewed and case discussed with Care Management/Social Workerr. Management plans discussed with the patient, family and they are in agreement.  CODE STATUS: DNR  TOTAL CCARE TIME SPENT IN TAKING CARE OF THIS PATIENT: 15 minutes.      Enedina FinnerSona Artice Bergerson M.D on 05/19/2017 at 6:05 PM  Between 7am to 6pm - Pager - [phone]      After 6pm go to [email or web address] - Social research officer, governmentpassword [email or web address]  [facility] [place]  Office  [phone]  CC: Primary care physician; Lauro RegulusAnderson, Marshall W, MD

## 2017-05-19 NOTE — Progress Notes (Signed)
While rounding, CH was informed that the Pt will transition to comfort care. RN stated that the Pt is tired and ready to proceed. Family is bedside. CH did not speak with family at this time. CH will monitor.    05/19/17 1000  Clinical Encounter Type  Visited With Health care provider  Visit Type Follow-up;Spiritual support  Referral From Nurse  Consult/Referral To Chaplain

## 2017-05-19 NOTE — Clinical Social Work Note (Signed)
Clinical Social Work Assessment  Patient Details  Name: Ronald Weber MRN: [id number] Date of Birth: 10/16/1928  Date of referral:  05/19/17               Reason for consult:  Discharge Planning                Permission sought to share information with:    Permission granted to share information::     Name::        Agency::     Relationship::     Contact Information:     Housing/Transportation Living arrangements for the past 2 months:  Assisted Living Facility Source of Information:  Adult Children Patient Interpreter Needed:  None Criminal Activity/Legal Involvement Pertinent to Current Situation/Hospitalization:  No - Comment as needed Significant Relationships:  Adult Children Lives with:    Do you feel safe going back to the place where you live?  (n/a) Need for family participation in patient care:  Yes (Comment)  Care giving concerns: [facility] last Thursday.    Social Worker assessment / plan:  Patient has been made comfort care and patient's son requested to speak with CSW regarding [facility] and financial concerns. CSW spoke with patient's son and he was wanting to know if [facility] would reimburse him for the money since patient was just placed there.   CSW followed up with [facility] and they contacted patient's son and all questions were answered. CSW followed up with patient's son and he was pleased with the answers that [facility] provided him.   Employment status:  Retired Health and safety inspectornsurance information:  Medicare PT Recommendations:    Information / Referral to community resources:     Patient/Family's Response to care:  Patient's son was appreciation of CSW assistance.  Patient/Family's Understanding of and Emotional Response to Diagnosis, Current Treatment, and Prognosis:  Patient's son was grieving for his father but was focusing on other things as a potential coping mechanism.  Emotional Assessment Appearance:     Attitude/Demeanor/Rapport:    Affect (typically observed):    Orientation:    Alcohol / Substance use:  Not Applicable Psych involvement (Current and /or in the community):  No (Comment)  Discharge Needs  Concerns to be addressed:  Care Coordination Readmission within the last 30 days:  No Current discharge risk:  None Barriers to Discharge:  No Barriers Identified   York SpanielMonica Darnell Jeschke, LCSW 05/19/2017, 3:20 PM

## 2017-05-19 NOTE — Progress Notes (Signed)
ANTICOAGULATION CONSULT NOTE - Initial Consult  Pharmacy Consult for Heparin drip Indication: pulmonary embolus  Allergies  Allergen Reactions  . Colchicine   . Doxycycline Other (See Comments)  . Meloxicam Other (See Comments)  . Sulfa Antibiotics     Other reaction(s): UNKNOWN  . Tussin  [Guaifenesin] Other (See Comments)  . Amoxicillin-Pot Clavulanate Rash    Patient Measurements: Height: 5\' 10"  (177.8 cm) Weight: 172 lb 6.4 oz (78.2 kg) IBW/kg (Calculated) : 73 Heparin Dosing Weight: 74.8 kg   Vital Signs: Temp: 97.8 F (36.6 C) (12/14 0400) Temp Source: Oral (12/14 0400) BP: 115/59 (12/14 0400) Pulse Rate: 80 (12/14 0400)  Labs: Recent Labs    05/16/17 0931 05/16/17 1300  05/17/17 0443  05/18/17 0522  05/18/17 1300  05/18/17 2043 05/19/17 0037 05/19/17 0419 05/19/17 0558  HGB 12.8*  --   --  11.9*  --  9.5*  --  9.1*  --   --   --  9.1*  --   HCT 39.0*  --   --  37.0*  --  28.4*  --  27.5*  --   --   --  27.4*  --   PLT 223  --   --  191  --  165  --   --   --   --   --  165  --   APTT 36  --   --   --   --  >160*  --   --   --   --   --   --  >160*  LABPROT 15.5*  --   --   --   --   --   --   --   --   --   --   --   --   INR 1.24  --   --   --   --   --   --   --   --   --   --   --   --   HEPARINUNFRC  --   --    < > 0.56   < > 0.52  --   --   --  0.31  --   --  0.48  CREATININE 2.28*  --    < > 4.36*   < > 1.83*   < > 1.41*   < > 0.91 0.79 0.78  --   TROPONINI  --  0.11*  --   --   --   --   --   --   --   --   --   --   --    < > = values in this interval not displayed.    Estimated Creatinine Clearance: 68.4 mL/min (by C-G formula based on SCr of 0.78 mg/dL).   Medical History: Past Medical History:  Diagnosis Date  . Anxiety   . Atrioventricular block    s/p pacemaker placement  . B12 deficiency   . BPH with elevated PSA   . BPH with obstruction/lower urinary tract symptoms   . Chronic low back pain   . COPD (chronic obstructive pulmonary  disease) (HCC)   . Depression   . Diverticulitis   . Gout   . Hypertension   . Lumbar disc disease   . Renal insufficiency   . Sensory loss   . Sleep apnea   . Trigeminal neuralgia   . Urge incontinence     Medications:  Scheduled:  . aspirin EC  81 mg Oral Daily  . budesonide (PULMICORT) nebulizer solution  0.25 mg Nebulization Q12H  . Chlorhexidine Gluconate Cloth  6 each Topical Q0600  . feeding supplement (ENSURE ENLIVE)  237 mL Oral TID BM  . fesoterodine  4 mg Oral Daily  . Gerhardt's butt cream   Topical BID  . ipratropium-albuterol  3 mL Nebulization Q6H  . latanoprost  1 drop Both Eyes QHS  . lidocaine  1 patch Transdermal Q24H  . mouth rinse  15 mL Mouth Rinse BID  . multivitamin  1 tablet Oral QHS  . mupirocin ointment  1 application Nasal BID  . pantoprazole (PROTONIX) IV  40 mg Intravenous Q12H  . sertraline  25 mg Oral Daily  . sodium chloride flush  10-40 mL Intracatheter Q12H   Infusions:  . ceFEPime (MAXIPIME) IV Stopped (05/19/17 [id number])  . dextrose 5 % and 0.9% NaCl 50 mL/hr at 05/18/17 0925  . heparin 1,250 Units/hr (05/18/17 1930)  . pureflow 3 each (05/18/17 2100)  . vancomycin Stopped (05/18/17 1840)    Assessment: 83 yo M with Pulmonary embolism, ?sepsis, to start Heparin Drip. Heparin paused for A-line insertion  Goal of Therapy:  Heparin level 0.3-0.7 units/ml Monitor platelets by anticoagulation protocol: Yes   Plan:  Heparin resumed at 1300 at previous rate. Will recheck a HL in 8 hours.   12/13 : HL @ 20:43 = 0.31  Will continue this pt on current rate and recheck HL in 8 hrs.   12/14 AM heparin level 0.48. Continue current regimen. Recheck heparin level and CBC with tomorrow AM labs.  Blossom Crume S Clinical Pharmacist  05/19/17 7:04 AM

## 2017-05-19 NOTE — Progress Notes (Signed)
[facility] Consultation     Date: 05/19/2017,   MRN# [id number] Ronald Weber 1928-10-09 Code Status:     Code Status Orders  (From admission, onward)        Start     Ordered   05/17/17 1108  Do not attempt resuscitation (DNR)  Continuous    Question Answer Comment  In the event of cardiac or respiratory ARREST Do not call a "code blue"   In the event of cardiac or respiratory ARREST Do not perform Intubation, CPR, defibrillation or ACLS   In the event of cardiac or respiratory ARREST Use medication by any route, position, wound care, and other measures to relive pain and suffering. May use oxygen, suction and manual treatment of airway obstruction as needed for comfort.      05/17/17 1108    Code Status History    Date Active Date Inactive Code Status Order ID Comments User Context   05/16/2017 16:28 05/17/2017 11:08 Full Code [id number]  Morton StallGriffin, Crystal, NP Inpatient   05/16/2017 12:48 05/16/2017 16:28 Partial Code [id number]  Enedina FinnerPatel, Sona, MD Inpatient   04/08/2017 13:52 04/12/2017 21:57 Full Code [id number]  Marguarite ArbourSparks, Jeffrey D, MD Inpatient        AdmissionWeight: 165 lb (74.8 kg)                 CurrentWeight: 172 lb 6.4 oz (78.2 kg) Ronald Weber is a 83 y.o. old male     SUBJECTIVE:   Remains critically ill.  Transitioned to comfort care this morning.    MEDICATIONS    Current Medication:   Current Facility-Administered Medications:  .  acetaminophen (TYLENOL) tablet 650 mg, 650 mg, Oral, Q6H PRN, 650 mg at 05/17/17 0515 **OR** acetaminophen (TYLENOL) suppository 650 mg, 650 mg, Rectal, Q6H PRN, Enedina FinnerPatel, Sona, MD .  aspirin EC tablet 81 mg, 81 mg, Oral, Daily, Enedina FinnerPatel, Sona, MD, 81 mg at 05/16/17 1352 .  budesonide (PULMICORT) nebulizer solution 0.25 mg, 0.25 mg, Nebulization, Q12H, Tukov, Magadalene S, NP, 0.25 mg at 05/19/17 0825 .  ceFEPIme (MAXIPIME) 1 g in dextrose 5 % 50 mL IVPB, 1 g, Intravenous, Q8H, Enid BaasKalisetti, Radhika, MD, Stopped at  05/19/17 [id number] .  Chlorhexidine Gluconate Cloth 2 % PADS 6 each, 6 each, Topical, Q0600, Lewie Loronukov, Magadalene S, NP, 6 each at 05/19/17 [phone] .  dextrose 5 %-0.9 % sodium chloride infusion, , Intravenous, Continuous, Cristy HiltsKhan, Marcellius Montagna, MD, Last Rate: 50 mL/hr at 05/18/17 0925 .  feeding supplement (ENSURE ENLIVE) (ENSURE ENLIVE) liquid 237 mL, 237 mL, Oral, TID BM, Tukov, Magadalene S, NP, 237 mL at 05/17/17 2045 .  fentaNYL (SUBLIMAZE) injection 50 mcg, 50 mcg, Intravenous, Q1H PRN, Cristy HiltsKhan, Mayelin Panos, MD, 50 mcg at 05/19/17 1113 .  fesoterodine (TOVIAZ) tablet 4 mg, 4 mg, Oral, Daily, Enedina FinnerPatel, Sona, MD, 4 mg at 05/17/17 1050 .  Gerhardt's butt cream, , Topical, BID, Nemiah CommanderKalisetti, Radhika, MD .  heparin ADULT infusion 100 units/mL (25000 units/23650mL sodium chloride 0.45%), 1,250 Units/hr, Intravenous, Continuous, Governor RooksLord, Rebecca, MD, Last Rate: 12.5 mL/hr at 05/18/17 1930, 1,250 Units/hr at 05/18/17 1930 .  heparin injection 1,000-6,000 Units, 1,000-6,000 Units, CRRT, PRN, Cherylann RatelLateef, Munsoor, MD, 3,000 Units at 05/19/17 1113 .  ipratropium-albuterol (DUONEB) 0.5-2.5 (3) MG/3ML nebulizer solution 3 mL, 3 mL, Nebulization, Q6H, Cristy HiltsKhan, Belynda Pagaduan, MD, 3 mL at 05/19/17 0825 .  latanoprost (XALATAN) 0.005 % ophthalmic solution 1 drop, 1 drop, Both Eyes, QHS, Enedina FinnerPatel, Sona, MD, 1 drop at 05/18/17 2314 .  lidocaine (LIDODERM) 5 % 1  patch, 1 patch, Transdermal, Q24H, Enedina Finner, MD, 1 patch at 05/18/17 1024 .  lidocaine (XYLOCAINE) 2 % viscous mouth solution 15 mL, 15 mL, Mouth/Throat, Q4H PRN, Belia Heman, Kurian, MD, 15 mL at 05/17/17 1333 .  MEDLINE mouth rinse, 15 mL, Mouth Rinse, BID, Kasa, Kurian, MD, 15 mL at 05/18/17 2157 .  multivitamin (RENA-VIT) tablet 1 tablet, 1 tablet, Oral, QHS, Erin Fulling, MD, 1 tablet at 05/17/17 2138 .  mupirocin ointment (BACTROBAN) 2 % 1 application, 1 application, Nasal, BID, Tukov, Magadalene S, NP, 1 application at 05/18/17 2327 .  ondansetron (ZOFRAN) tablet 4 mg, 4 mg, Oral, Q6H PRN **OR** ondansetron  (ZOFRAN) injection 4 mg, 4 mg, Intravenous, Q6H PRN, Enedina Finner, MD .  oxyCODONE (Oxy IR/ROXICODONE) immediate release tablet 5 mg, 5 mg, Oral, Q4H PRN, Varughese, [name] S, NP, 5 mg at 05/17/17 2312 .  pantoprazole (PROTONIX) injection 40 mg, 40 mg, Intravenous, Q12H, Cristy Hilts, MD, 40 mg at 05/18/17 2205 .  polyethylene glycol (MIRALAX / GLYCOLAX) packet 17 g, 17 g, Oral, Daily PRN, Enedina Finner, MD .  sertraline (ZOLOFT) tablet 25 mg, 25 mg, Oral, Daily, Enedina Finner, MD, 25 mg at 05/17/17 1050 .  sodium chloride flush (NS) 0.9 % injection 10-40 mL, 10-40 mL, Intracatheter, Q12H, Kalisetti, Radhika, MD, 10 mL at 05/18/17 2200 .  sodium chloride flush (NS) 0.9 % injection 10-40 mL, 10-40 mL, Intracatheter, PRN, Enid Baas, MD .  vancomycin (VANCOCIN) IVPB 1000 mg/200 mL premix, 1,000 mg, Intravenous, Q24H, Enid Baas, MD, Stopped at 05/18/17 1840    VS: BP 116/65   Pulse 82   Temp (!) 97.3 F (36.3 C) (Axillary)   Resp 19   Ht 5\' 10"  (1.778 m)   Wt 172 lb 6.4 oz (78.2 kg)   SpO2 97%   BMI 24.74 kg/m      PHYSICAL EXAM   Lethargic. Follows commands. CVS S1, S2, 0. Chest: bilateral crackles. No wheezes. Abdomen is soft, nontender, nondistended.  Bowel sounds are positive. Lower extremity edema left more than right.   LABS    Recent Labs    05/17/17 0443  05/18/17 0522  05/18/17 1300 05/18/17 1659 05/18/17 2043 05/19/17 0037 05/19/17 0419  HGB 11.9*  --  9.5*  --  9.1*  --   --   --  9.1*  HCT 37.0*  --  28.4*  --  27.5*  --   --   --  27.4*  MCV 91.0  --  88.0  --   --   --   --   --  88.0  WBC 10.7*  --  8.6  --   --   --   --   --  11.0*  BUN 62*   < > 31*   < > 23* 20 18 16 14   CREATININE 4.36*   < > 1.83*   < > 1.41* 1.07 0.91 0.79 0.78  GLUCOSE 64*   < > 82   < > 90 93 100* 125* 115*  CALCIUM 8.1*   < > 8.0*   < > 8.0* 7.9* 7.8* 7.9* 7.8*   < > = values in this interval not displayed.  ,    No results for input(s): PH in the last 72  hours.  Invalid input(s): PCO2, PO2, BASEEXCESS, BASEDEFICITE, TFT    CULTURE RESULTS   Recent Results (from the past 240 hour(s))  Culture, blood (Routine x 2)     Status: None (Preliminary result)   Collection Time:  05/16/17  9:29 AM  Result Value Ref Range Status   Specimen Description BLOOD LEFT FORARM  Final   Special Requests BOTTLES DRAWN AEROBIC AND ANAEROBIC BCHV  Final   Culture NO GROWTH 3 DAYS  Final   Report Status PENDING  Incomplete  Culture, blood (Routine x 2)     Status: None (Preliminary result)   Collection Time: 05/16/17  9:29 AM  Result Value Ref Range Status   Specimen Description BLOOD RIGHT ARM  Final   Special Requests BOTTLES DRAWN AEROBIC AND ANAEROBIC  BCHV  Final   Culture NO GROWTH 3 DAYS  Final   Report Status PENDING  Incomplete  MRSA PCR Screening     Status: Abnormal   Collection Time: 05/16/17  1:18 PM  Result Value Ref Range Status   MRSA by PCR POSITIVE (A) NEGATIVE Final    Comment:        The GeneXpert MRSA Assay (FDA approved for NASAL specimens only), is one component of a comprehensive MRSA colonization surveillance program. It is not intended to diagnose MRSA infection nor to guide or monitor treatment for MRSA infections. RESULT CALLED TO, READ BACK BY AND VERIFIED WITH: JAKE ROBINSON 05/16/17 1731 KLW           IMAGING    No results found.    ASSESSMENT/PLAN    83 years old gentleman with a past medical history significant for anxiety, depression, complete heart block status post pacemaker placement, COPD, gout, hypertension, sleep apnea, trigeminal neuralgia, BPH, tobacco abuse with a recent admission to the hospital with fall and rhabdo who is admitted to the ICU with the following  Healthcare associated pneumonia Left PE Left leg DVT Acute on chronic renal failure-on CRRT now Hyperkalemia-resolved Anemia  Overall remains critically ill.  CRRT was stopped by nephrology this morning with plans to transition  to intermittent hemodialysis. Patient was not on vasopressors. Blood cultures remain negative. Fecal occult was positive. Labs reviewed this morning.  Hemoglobin has remained stable.  At the request of the patient and the family, after discussion with palliative care service, the patient was transitioned to comfort care this morning.   I spoke with the patient's son as well who confirmed the above.  The patient's grandson has fractured his leg and is in [facility] at this time.  Feels that the patient gets agitated when he gets morphine however he does fine with oxycodone.  Will DC morphine and try fentanyl for comfort.    Will decide on when to proceed with full comfort care based on the disposition of the patient's grandson however at this time we are going to escalate any care.  Respecting the family's wishes, we will transition the patient to comfort care to allow natural death when ever the family is ready.   Total critical care time spent was 35 minutes.   Cristy HiltsIrtaza Woodley Petzold, M.D.  Pulmonary & Critical Care Medicine

## 2017-05-19 NOTE — Progress Notes (Signed)
SLP Cancellation Note  Patient Details Name: Ronald Weber MRN: [id number] DOB: 12-Sep-1928   Cancelled treatment:       Reason Eval/Treat Not Completed: Medical issues which prohibited therapy(chart reviewed; consulted w/ Palliative Care, NSG). Pt has had a decline in medical status, Palliative Care and confirmed family/pt wishes to move to comfort care status. NSG indicated BSE not indicated at this time and will reconsult ST services if any change in status.     Jerilynn SomKatherine Juliyah Mergen, MS, CCC-SLP Zhane Bluitt 05/19/2017, 10:21 AM

## 2017-05-19 NOTE — Progress Notes (Signed)
Daily Progress Note   Patient Name: Ronald Weber       Date: 05/19/2017 DOB: [birth year]  Age: 83 y.o. MRN#: [id number] Attending Physician: Enedina FinnerPatel, Sona, MD Primary Care Physician: Lauro RegulusAnderson, Marshall W, MD Admit Date: 05/16/2017  Reason for Consultation/Follow-up: Establishing goals of care  Subjective: Ronald Weber is resting in bed with bair hugger in place. Patient is shivering. Dried blood on his lips, per nursing due to mouth sores. Family is very worried about making decisions, as they are not sure what he would want and would like to honor his wishes. They do not believe he would be happy with his current CRRT, nor permanent dialysis. And he states he is worried his father would be angry with him. Questions as to feeding tube placement as patient has only eaten a few bites of apple sauce and a spoon full of milkshake. Discussed aggressive care path vs comfort care path. Nephrology in to speak with family, family to decide plan for changing from CRRT to hemodialysis.   Family and I spoke with patient, [facility], son at other side, daughter in law next to him. When asked if he wants to continue dialysis he initially said " I guess I have to, I don't have another choice." Upon discussing comfort care path, he was asked by son and myself if he wanted to continue dialysis, and current treatment to get better and he said "no, I don't". He indicated he had been fighting to improve for his son and was ready to stop treatment, and be comfortable. He was asked multiple times by his son if he was ready to stop active treatment and he indicated he was.   Request made to wait for comfort care until grandsons have a chance to come to see him.   Dr. Welton FlakesKhan aware.   Length of Stay: 3  Current  Medications: Scheduled Meds:  . aspirin EC  81 mg Oral Daily  . budesonide (PULMICORT) nebulizer solution  0.25 mg Nebulization Q12H  . Chlorhexidine Gluconate Cloth  6 each Topical Q0600  . feeding supplement (ENSURE ENLIVE)  237 mL Oral TID BM  . fesoterodine  4 mg Oral Daily  . Gerhardt's butt cream   Topical BID  . ipratropium-albuterol  3 mL Nebulization Q6H  . latanoprost  1 drop Both Eyes QHS  . lidocaine  1 patch Transdermal Q24H  . mouth rinse  15 mL Mouth Rinse BID  . multivitamin  1 tablet Oral QHS  . mupirocin ointment  1 application Nasal BID  . pantoprazole (PROTONIX) IV  40 mg Intravenous Q12H  . sertraline  25 mg Oral Daily  . sodium chloride flush  10-40 mL Intracatheter Q12H    Continuous Infusions: . ceFEPime (MAXIPIME) IV Stopped (05/19/17 7829)  . dextrose 5 % and 0.9% NaCl 50 mL/hr at 05/18/17 0925  . heparin 1,250 Units/hr (05/18/17 1930)  . vancomycin Stopped (05/18/17 1840)    PRN Meds: acetaminophen **OR** acetaminophen, heparin, lidocaine, morphine injection, ondansetron **OR** ondansetron (ZOFRAN) IV, oxyCODONE, polyethylene glycol, sodium chloride flush  Physical Exam  Constitutional: No distress.  Lawyer in place.  HENT:  Dried blood on lips.  Pulmonary/Chest: Effort normal.  Nasal cannula  Skin:  Pale            Vital Signs: BP 119/65   Pulse 73   Temp (!) 97.3 F (36.3 C) (Axillary)   Resp 19   Ht 5\' 10"  (1.778 m)   Wt 78.2 kg (172 lb 6.4 oz)   SpO2 98%   BMI 24.74 kg/m  SpO2: SpO2: 98 % O2 Device: O2 Device: Nasal Cannula O2 Flow Rate: O2 Flow Rate (L/min): 2 L/min  Intake/output summary:   Intake/Output Summary (Last 24 hours) at 05/19/2017 1011 Last data filed at 05/19/2017 0800 Gross per 24 hour  Intake 1681.25 ml  Output 301 ml  Net 1380.25 ml   LBM: Last BM Date: 05/19/17 Baseline Weight: Weight: 74.8 kg (165 lb) Most recent weight: Weight: 78.2 kg (172 lb 6.4 oz)       Palliative Assessment/Data:  20%    Flowsheet Rows     Most Recent Value  Intake Tab  Referral Department  Hospitalist  Unit at Time of Referral  Med/Surg Unit  Palliative Care Primary Diagnosis  Sepsis/Infectious Disease  Date Notified  05/16/17  Palliative Care Type  New Palliative care  Reason for referral  Clarify Goals of Care  Date of Admission  05/16/17  Date first seen by Palliative Care  05/16/17  # of days Palliative referral response time  0 Day(s)  # of days IP prior to Palliative referral  0  Clinical Assessment  Psychosocial & Spiritual Assessment  Palliative Care Outcomes      Patient Active Problem List   Diagnosis Date Noted  . Sepsis (HCC) 05/16/2017  . Pressure injury of skin 05/16/2017  . CAP (community acquired pneumonia) 04/08/2017  . Acute respiratory failure (HCC) 04/08/2017  . Rhabdomyolysis 04/08/2017  . Elevated troponin 04/08/2017  . Incontinence 08/20/2015  . BPH (benign prostatic hyperplasia) 08/20/2015  . Moderate episode of recurrent major depressive disorder (HCC) 07/15/2015  . Peripheral vascular disease (HCC) 03/31/2015  . Derangement of knee 10/03/2014  . Acute gout 08/06/2014  . Acute gouty arthritis 08/06/2014  . B12 deficiency 04/07/2014  . Absence of sensation 04/07/2014  . Disordered sleep 04/07/2014  . Fothergill's neuralgia 04/07/2014  . DDD (degenerative disc disease), lumbar 10/11/2013  . Neuritis or radiculitis due to rupture of lumbar intervertebral disc 10/11/2013  . Degenerative arthritis of lumbar spine 10/11/2013  . CAFL (chronic airflow limitation) (HCC) 09/12/2013  . BP (high blood pressure) 09/12/2013  . Chronic obstructive pulmonary disease (HCC) 09/12/2013  . Artificial cardiac pacemaker 01/30/2013  . Benign prostatic hyperplasia with urinary obstruction 04/06/2012  . Benign fibroma  of prostate 04/06/2012  . Abnormal prostate specific antigen 04/06/2012  . FOM (frequency of micturition) 04/06/2012  . Elevated prostate specific antigen  (PSA) 04/06/2012    Palliative Care Assessment & Plan   Patient Profile: PaulNanceis a88 y.o.malewith a known history of anxiety depression, complete heart block status post pacemaker, recent fall with acute respiratory failure and traumatic rhabdomyolysis who was discharged recently [facility]rehab[facility] assisted living facility. He currently has PNA, a LLE PE, and a DVT.      Assessment: Patient now on dialysis. Minimal to no oral intake.   Recommendations/Plan:  Family wishes to pursue comfort care. They ask that we wait for grandchildren to see him first.    Code Status:    Code Status Orders  (From admission, onward)        Start     Ordered   05/17/17 1108  Do not attempt resuscitation (DNR)  Continuous    Question Answer Comment  In the event of cardiac or respiratory ARREST Do not call a "code blue"   In the event of cardiac or respiratory ARREST Do not perform Intubation, CPR, defibrillation or ACLS   In the event of cardiac or respiratory ARREST Use medication by any route, position, wound care, and other measures to relive pain and suffering. May use oxygen, suction and manual treatment of airway obstruction as needed for comfort.      05/17/17 1108    Code Status History    Date Active Date Inactive Code Status Order ID Comments User Context   05/16/2017 16:28 05/17/2017 11:08 Full Code [id number]  Morton StallGriffin, Bobi Daudelin, NP Inpatient   05/16/2017 12:48 05/16/2017 16:28 Partial Code [id number]  Enedina FinnerPatel, Sona, MD Inpatient   04/08/2017 13:52 04/12/2017 21:57 Full Code [id number]  Marguarite ArbourSparks, Jeffrey D, MD Inpatient       Prognosis:   Hours - Days Upon stopping aggressive care.   Discharge Planning:  Anticipated Hospital Death  Care plan was discussed with Dr. Welton FlakesKhan.   Thank you for allowing the Palliative Medicine Team to assist in the care of this patient.   Time In: 8:40 Time Out: 10:25 Total Time 1 hour 45 min Prolonged Time Billed  Yes      Greater than 50%  of  this time was spent counseling and coordinating care related to the above assessment and plan.  Morton Stallrystal Mashonda Broski, NP  Please contact Palliative Medicine Team phone at [phone] for questions and concerns.

## 2017-05-20 MED ORDER — SODIUM CHLORIDE 0.9% FLUSH
10.0000 mL | INTRAVENOUS | Status: DC | PRN
  Administered 2017-05-20 – 2017-05-21 (×9): 10 mL via INTRAVENOUS
  Filled 2017-05-20 (×9): qty 10

## 2017-05-20 MED ORDER — SODIUM CHLORIDE 0.9% FLUSH
10.0000 mL | Freq: Two times a day (BID) | INTRAVENOUS | Status: DC
  Administered 2017-05-20 – 2017-05-22 (×6): 10 mL via INTRAVENOUS

## 2017-05-20 MED ORDER — FENTANYL CITRATE (PF) 100 MCG/2ML IJ SOLN
50.0000 ug | INTRAMUSCULAR | Status: DC | PRN
  Administered 2017-05-20 – 2017-05-22 (×12): 50 ug via INTRAVENOUS
  Filled 2017-05-20 (×12): qty 2

## 2017-05-20 NOTE — Progress Notes (Signed)
[facility] - [place] [facility]   PATIENT NAME: Ronald Weber    MR#:  [id number]  DATE OF BIRTH:  [birth year]  SUBJECTIVE:   -admitted with pneumonia, new DVT and PE admitted with hypoxic respiratory failure and sepsis.   Patient is made comfort care given his poor prognosis and declining medical condition  REVIEW OF SYSTEMS:  Review of Systems  Unable to perform ROS: Medical condition    DRUG ALLERGIES:   Allergies  Allergen Reactions  . Colchicine   . Doxycycline Other (See Comments)  . Meloxicam Other (See Comments)  . Sulfa Antibiotics     Other reaction(s): UNKNOWN  . Tussin  [Guaifenesin] Other (See Comments)  . Amoxicillin-Pot Clavulanate Rash    VITALS:  Blood pressure (!) 147/91, pulse 84, temperature 97.8 F (36.6 C), resp. rate (!) 23, height 5\' 10"  (1.778 m), weight 78.2 kg (172 lb 6.4 oz), SpO2 (!) 89 %.  PHYSICAL EXAMINATION:  Physical Exam Limited exam secondary to patient being now comfort care GENERAL:  83 y.o.-year-old patient lying in the bed, gurgling upper airway secretions while talking.  EYES: Pupils equal, round, reactive to light and accommodation. No scleral icterus.HEENT: Head atraumatic, normocephalic. Oropharynx and nasopharynx clear.  NECK:  Supple, no jugular venous distention. No thyroid enlargement, no tenderness.  LUNGS: gurgling upper airway secretions, however normal bilateral breath sounds.  no wheezing, rales,rhonchi or crepitation. No use of accessory muscles of respiration.  CARDIOVASCULAR: S1, S2 normal. Norubs, or gallops. 3/6 systolic murmur present  LABORATORY PANEL:   CBC Recent Labs  Lab 05/19/17 0419  WBC 11.0*  HGB 9.1*  HCT 27.4*  PLT 165   ------------------------------------------------------------------------------------------------------------------  Chemistries  Recent Labs  Lab 05/16/17 0931  05/19/17 0419  NA 139   < > 135  K 2.7*   < > 3.7  CL 121*   < > 103  CO2 9*   < > 25    GLUCOSE 56*   < > 115*  BUN 34*   < > 14  CREATININE 2.28*   < > 0.78  CALCIUM <4.0*   < > 7.8*  MG  --    < > 1.7  AST 71*  --   --   ALT 57  --   --   ALKPHOS 54  --   --   BILITOT 0.7  --   --    < > = values in this interval not displayed.   ------------------------------------------------------------------------------------------------------------------  Cardiac Enzymes Recent Labs  Lab 05/16/17 1300  TROPONINI 0.11*   ------------------------------------------------------------------------------------------------------------------  RADIOLOGY:  No results found.  EKG:   Orders placed or performed during the hospital encounter of 05/16/17  . EKG 12-Lead  . EKG 12-Lead    ASSESSMENT AND PLAN:   83 year old male with past medical history significant for complete heart block status post pacemaker, anxiety and depression, hypertension, COPD, arthritis, recent admission for traumatic rhabdomyolysis currently from assisted living facility came in with shortness of breath and noted to have PE and pneumonia  1. Septic shock-secondary to multifocal pneumonia.  2. Acute renal failure with hyperkalemia- ATN secondary to hypotension and sepsis -Patient was on CRRT now discontinued since he is comfort care  3. Acute hypoxic respiratory failure-secondary to multifocal pneumonia and pulmonary emboli -Off BiPAP  -currently on 2 L nasal cannula  4. DVT and PE-recent immobilization from fall and rehabilitation. -Left lower extremity DVT, but due to acute illness-not a candidate for thrombolysis.   Palliative care consultation  was made.  After lengthy discussion with family I's and ICU attending decision was made towards transition to FULL comfort care.  CODE STATUS-DNR  All the records are reviewed and case discussed with Care Management/Social Workerr. Management plans discussed with the patient, family and they are in agreement.  CODE STATUS: DNR  TOTAL  TIME SPENT IN  TAKING CARE OF THIS PATIENT: 15 minutes.    Enedina FinnerSona Ryo Klang M.D on 05/20/2017 at 1:23 PM  Between 7am to 6pm - Pager - [phone]      After 6pm go to [email or web address] - Social research officer, governmentpassword [email or web address]  [facility] [place] [facility]  Office  [phone]  CC: Primary care physician; Lauro RegulusAnderson, Marshall W, MD

## 2017-05-20 NOTE — Progress Notes (Signed)
Gradually having less alert intervals. Wet to congested respiratory status with PRN's given. Fentnyl given as needed with partial relief with MD notified with dose increase ordered. Family in and out and very attentive to pt. Frequent oral care/ regular position changes performed. Slow, gradual decline of comfort care pt.

## 2017-05-21 LAB — CULTURE, BLOOD (ROUTINE X 2)
CULTURE: NO GROWTH
Culture: NO GROWTH

## 2017-05-21 NOTE — Plan of Care (Signed)
  Progressing Education: Knowledge of the prescribed therapeutic regimen will improve 05/21/2017 0426 - Progressing by Peterson LombardKlenner, Bodee Lafoe C, RN Coping: Ability to identify and develop effective coping behavior will improve 05/21/2017 0426 - Progressing by Peterson LombardKlenner, Raden Byington C, RN Clinical Measurements: Quality of life will improve 05/21/2017 0426 - Progressing by Peterson LombardKlenner, Maclean Foister C, RN Respiratory: Verbalizations of increased ease of respirations will increase 05/21/2017 0426 - Progressing by Peterson LombardKlenner, Lilibeth Opie C, RN Role Relationship: Family's ability to cope with current situation will improve 05/21/2017 0426 - Progressing by Peterson LombardKlenner, Kaisy Severino C, RN Pain Management: Satisfaction with pain management regimen will improve 05/21/2017 0426 - Progressing by Peterson LombardKlenner, Ozan Maclay C, RN

## 2017-05-21 NOTE — Progress Notes (Signed)
[facility] - [place] at [facility]   PATIENT NAME: Ronald Weber    MR#:  [id number]  DATE OF BIRTH:  April 11, 1929  SUBJECTIVE:   -admitted with pneumonia, new DVT and PE admitted with hypoxic respiratory failure and sepsis.   Patient is made comfort care given his poor prognosis and declining medical condition Grandson in the room  REVIEW OF SYSTEMS:  Review of Systems  Unable to perform ROS: Medical condition    DRUG ALLERGIES:   Allergies  Allergen Reactions  . Colchicine   . Doxycycline Other (See Comments)  . Meloxicam Other (See Comments)  . Sulfa Antibiotics     Other reaction(s): UNKNOWN  . Tussin  [Guaifenesin] Other (See Comments)  . Amoxicillin-Pot Clavulanate Rash    VITALS:  Blood pressure (!) 92/49, pulse (!) 108, temperature 97.8 F (36.6 C), resp. rate (!) 22, height 5\' 10"  (1.778 m), weight 78.2 kg (172 lb 6.4 oz), SpO2 93 %.  PHYSICAL EXAMINATION:  Physical Exam Limited exam secondary to patient being now comfort care GENERAL:  83 y.o.-year-old patient lying in the bed, gurgling upper airway secretions while talking.  NECK:  Supple, no jugular venous distention. No thyroid enlargement, no tenderness.  LUNGS: gurgling upper airway secretions, however normal bilateral breath sounds.  no wheezing, rales,rhonchi or crepitation. No use of accessory muscles of respiration.  CARDIOVASCULAR: S1, S2 normal. Norubs, or gallops. 3/6 systolic murmur present  LABORATORY PANEL:   CBC Recent Labs  Lab 05/19/17 0419  WBC 11.0*  HGB 9.1*  HCT 27.4*  PLT 165   ------------------------------------------------------------------------------------------------------------------  Chemistries  Recent Labs  Lab 05/16/17 0931  05/19/17 0419  NA 139   < > 135  K 2.7*   < > 3.7  CL 121*   < > 103  CO2 9*   < > 25  GLUCOSE 56*   < > 115*  BUN 34*   < > 14  CREATININE 2.28*   < > 0.78  CALCIUM <4.0*   < > 7.8*  MG  --    < > 1.7  AST 71*  --    --   ALT 57  --   --   ALKPHOS 54  --   --   BILITOT 0.7  --   --    < > = values in this interval not displayed.   ------------------------------------------------------------------------------------------------------------------  Cardiac Enzymes Recent Labs  Lab 05/16/17 1300  TROPONINI 0.11*   ------------------------------------------------------------------------------------------------------------------  RADIOLOGY:  No results found.  EKG:   Orders placed or performed during the hospital encounter of 05/16/17  . EKG 12-Lead  . EKG 12-Lead    ASSESSMENT AND PLAN:   83 year old male with past medical history significant for complete heart block status post pacemaker, anxiety and depression, hypertension, COPD, arthritis, recent admission for traumatic rhabdomyolysis currently from assisted living facility came in with shortness of breath and noted to have PE and pneumonia  1. Septic shock-secondary to multifocal pneumonia.  2. Acute renal failure with hyperkalemia- ATN secondary to hypotension and sepsis -Patient was on CRRT now discontinued since he is comfort care  3. Acute hypoxic respiratory failure-secondary to multifocal pneumonia and pulmonary emboli -Off BiPAP  -currently on 2 L nasal cannula  4. DVT and PE-recent immobilization from fall and rehabilitation. -Left lower extremity DVT, but due to acute illness-not a candidate for thrombolysis.   Palliative care consultation was made.  After lengthy discussion with family decision was made towards transition to FULL comfort care.  CODE STATUS-DNR  All the records are reviewed and case discussed with Care Management/Social Workerr. Management plans discussed with the patient, family and they are in agreement.  CODE STATUS: DNR  TOTAL  TIME SPENT IN TAKING CARE OF THIS PATIENT: 15 minutes.    Enedina FinnerSona Fantasia Jinkins M.D on 05/21/2017 at 10:36 AM  Between 7am to 6pm - Pager - [phone]      After 6pm go to  [email or web address] - Social research officer, governmentpassword [email or web address]  [facility] [place] [facility]  Office  [phone]  CC: Primary care physician; Lauro RegulusAnderson, Marshall W, MD

## 2017-05-21 NOTE — Progress Notes (Signed)
Pt having intervals of excessive congestion/secretions with moderate benefit from fentanyl, ativan, and robinul. Family in at long intervals. Continues to have some weeping from extremities related to edema. Removed unnecessary lines. Methiculous mouthcare performed but still having cracking/sores/dryness. Continued comfort measures.

## 2017-05-22 DIAGNOSIS — J189 Pneumonia, unspecified organism: Secondary | ICD-10-CM

## 2017-05-22 DIAGNOSIS — A419 Sepsis, unspecified organism: Principal | ICD-10-CM

## 2017-05-22 DIAGNOSIS — Z7189 Other specified counseling: Secondary | ICD-10-CM

## 2017-05-22 DIAGNOSIS — Z515 Encounter for palliative care: Secondary | ICD-10-CM

## 2017-05-22 MED ORDER — MORPHINE SULFATE (PF) 2 MG/ML IV SOLN
2.0000 mg | INTRAVENOUS | Status: DC | PRN
  Administered 2017-05-22: 12:00:00 2 mg via INTRAVENOUS
  Filled 2017-05-22: qty 1

## 2017-05-22 MED ORDER — POLYVINYL ALCOHOL 1.4 % OP SOLN: 1.0000 [drp] | Freq: Four times a day (QID) | OPHTHALMIC | 0 refills | Status: AC | PRN

## 2017-05-22 MED ORDER — GLYCOPYRROLATE 1 MG PO TABS: 1.0000 mg | ORAL_TABLET | ORAL | Status: AC | PRN

## 2017-05-22 MED ORDER — BIOTENE DRY MOUTH MT LIQD: 15.0000 mL | OROMUCOSAL | Status: AC | PRN

## 2017-05-22 MED ORDER — MORPHINE SULFATE (PF) 2 MG/ML IV SOLN: 2.0000 mg | INTRAVENOUS | 0 refills | Status: AC | PRN

## 2017-05-22 MED ORDER — LORAZEPAM 1 MG PO TABS: 1.0000 mg | ORAL_TABLET | ORAL | 0 refills | Status: AC | PRN

## 2017-05-22 MED ORDER — SCOPOLAMINE 1 MG/3DAYS TD PT72: 1.0000 | MEDICATED_PATCH | TRANSDERMAL | 12 refills | Status: AC

## 2017-05-22 MED ORDER — MORPHINE SULFATE (CONCENTRATE) 10 MG/0.5ML PO SOLN: 5.0000 mg | ORAL | Status: DC | PRN

## 2017-05-22 MED ORDER — HALOPERIDOL 0.5 MG PO TABS: 0.5000 mg | ORAL_TABLET | ORAL | Status: AC | PRN

## 2017-05-22 MED ORDER — MORPHINE SULFATE (CONCENTRATE) 10 MG/0.5ML PO SOLN: 5.0000 mg | ORAL | Status: AC | PRN

## 2017-05-22 MED ORDER — GLYCOPYRROLATE 0.2 MG/ML IJ SOLN
0.4000 mg | INTRAMUSCULAR | Status: DC | PRN
  Filled 2017-05-22: qty 2

## 2017-05-22 MED ORDER — FENTANYL CITRATE (PF) 100 MCG/2ML IJ SOLN: 50.0000 ug | INTRAMUSCULAR | 0 refills | Status: AC | PRN

## 2017-05-22 MED ORDER — GLYCOPYRROLATE 0.2 MG/ML IJ SOLN
0.2000 mg | INTRAMUSCULAR | Status: DC | PRN
  Administered 2017-05-22: 0.2 mg via INTRAVENOUS
  Filled 2017-05-22 (×2): qty 1

## 2017-05-22 MED ORDER — GLYCOPYRROLATE 1 MG PO TABS
1.0000 mg | ORAL_TABLET | ORAL | Status: DC | PRN
  Filled 2017-05-22: qty 1

## 2017-05-22 NOTE — Care Management Important Message (Signed)
Important Message  Patient Details  Name: Ronald Weber MRN: [id number] Date of Birth: February 10, 1929   Medicare Important Message Given:  Yes    Gwenette GreetBrenda S Saharra Santo, RN 05/22/2017, 8:34 AM

## 2017-05-22 NOTE — Progress Notes (Signed)
Clinical Education officer, museum (CSW) received a residential hospice consult. CSW met with patient's daughter in law Juliann Pulse at bedside and provided hospice choice. She and her husband Marya Amsler (patient's son) chose [facility]. Per Londell Moh hospice liaison patient can come to the hospice facility today. CSW completed D/C packet including DNR form. Please reconsult if future social work needs arise. CSW signing off.   [employer], LCSW [phone]

## 2017-05-22 NOTE — Plan of Care (Signed)
Pt d/ced to Hospice Home.  Unresponsive this shift.  Heavy secretions - gave robinul and he has a scop patch that is due to be changed today.  Gave IV morphine in PICC line which will stay.  Did remove rectal tube.  It had no contents.  When putting patient in tsfer packet, he expelled a lot of secretions and suctioned him.  Clydie BraunKaren, hospice liason called report and EMS for transport.

## 2017-05-22 NOTE — Progress Notes (Signed)
Daily Progress Note   Patient Name: Ronald Weber       Date: 05/22/2017 DOB: 08/02/1928  Age: [age] y.o. MRN#: [id number] Attending Physician: Ronald Weber, Qing, MD Primary Care Physician: Ronald Weber, Ronald W, MD Admit Date: 05/16/2017  Reason for Consultation/Follow-up: Terminal Care  Subjective: Patient in bed. Arouses to name. Increased respiration rate, audible secretions. Discussed symptom management and transferring to residential hospice with family. They are in agreement with plan.  Review of Systems  Unable to perform ROS: Acuity of condition    Length of Stay: 6  Current Medications: Scheduled Meds:  . scopolamine  1 patch Transdermal Q72H  . sodium chloride flush  10 mL Intravenous Q12H    Continuous Infusions:   PRN Meds: acetaminophen **OR** acetaminophen, antiseptic oral rinse, fentaNYL (SUBLIMAZE) injection, glycopyrrolate **OR** glycopyrrolate **OR** glycopyrrolate, haloperidol **OR** haloperidol **OR** haloperidol lactate, LORazepam **OR** LORazepam **OR** LORazepam, ondansetron **OR** ondansetron (ZOFRAN) IV, oxyCODONE **OR** oxyCODONE, polyvinyl alcohol, sodium chloride flush  Physical Exam  Constitutional: Ronald Weber appears well-developed and well-nourished.  Cardiovascular:  Diffuse, weeping, anasarca  Pulmonary/Chest:  Labored breathing  Neurological:  Lethargic, arouses to voice  Skin:  weeping  Nursing note and vitals reviewed.           Vital Signs: BP 131/70 (BP Location: Left Arm)   Pulse (!) 107   Temp 98.8 F (37.1 C) (Oral)   Resp 16   Ht 5\' 10"  (1.778 m)   Wt 78.2 kg (172 lb 6.4 oz)   SpO2 97%   BMI 24.74 kg/m  SpO2: SpO2: 97 % O2 Device: O2 Device: Nasal Cannula O2 Flow Rate: O2 Flow Rate (L/min): 2 L/min  Intake/output summary: No intake or  output data in the 24 hours ending 05/22/17 1012 LBM: Last BM Date: 05/20/17 Baseline Weight: Weight: 74.8 kg (165 lb) Most recent weight: Weight: 78.2 kg (172 lb 6.4 oz)       Palliative Assessment/Data: PPS: 10%    Flowsheet Rows     Most Recent Value  Intake Tab  Referral Department  Hospitalist  Unit at Time of Referral  Med/Surg Unit  Palliative Care Primary Diagnosis  Sepsis/Infectious Disease  Date Notified  05/16/17  Palliative Care Type  New Palliative care  Reason for referral  Clarify Goals of Care  Date of Admission  05/16/17  Date first  seen by Palliative Care  05/16/17  # of days Palliative referral response time  0 Day(s)  # of days IP prior to Palliative referral  0  Clinical Assessment  Psychosocial & Spiritual Assessment  Palliative Care Outcomes      Patient Active Problem List   Diagnosis Date Noted  . Sepsis (HCC) 05/16/2017  . Pressure injury of skin 05/16/2017  . CAP (community acquired pneumonia) 04/08/2017  . Acute respiratory failure (HCC) 04/08/2017  . Rhabdomyolysis 04/08/2017  . Elevated troponin 04/08/2017  . Incontinence 08/20/2015  . BPH (benign prostatic hyperplasia) 08/20/2015  . Moderate episode of recurrent major depressive disorder (HCC) 07/15/2015  . Peripheral vascular disease (HCC) 03/31/2015  . Derangement of knee 10/03/2014  . Acute gout 08/06/2014  . Acute gouty arthritis 08/06/2014  . B12 deficiency 04/07/2014  . Absence of sensation 04/07/2014  . Disordered sleep 04/07/2014  . Fothergill's neuralgia 04/07/2014  . DDD (degenerative disc disease), lumbar 10/11/2013  . Neuritis or radiculitis due to rupture of lumbar intervertebral disc 10/11/2013  . Degenerative arthritis of lumbar spine 10/11/2013  . CAFL (chronic airflow limitation) (HCC) 09/12/2013  . BP (high blood pressure) 09/12/2013  . Chronic obstructive pulmonary disease (HCC) 09/12/2013  . Artificial cardiac pacemaker 01/30/2013  . Benign prostatic hyperplasia  with urinary obstruction 04/06/2012  . Benign fibroma of prostate 04/06/2012  . Abnormal prostate specific antigen 04/06/2012  . FOM (frequency of micturition) 04/06/2012  . Elevated prostate specific antigen (PSA) 04/06/2012    Palliative Care Assessment & Plan   Patient Profile: PaulNanceis a88 y.o.malewith a known history of anxiety depression, complete heart block status post pacemaker, recent fall with acute respiratory failure and traumatic rhabdomyolysis who was discharged recently fromrehabto assisted living facility. Ronald Weber currently has PNA, a LLE PE, and a DVT. Ronald Weber was admitted for pnuemonia, developed renal failure, sepsis. Transition was made to full comfort measures only.  Assessment/Recommendations/Plan   Social work referral for residential hospice placement  Recommend discontinuing O2 at hospice home and managing SOB with oxycodone solution and lorazepam  Increase robinul to .4mg  due to increased secretions  Goals of Care and Additional Recommendations:  Limitations on Scope of Treatment: Full Comfort Care  Code Status:  DNR  Prognosis:   Hours - Days  Discharge Planning:  Hospice facility  Care plan was discussed with patient's son and Ronald DandyMary, RN  Thank you for allowing the Palliative Medicine Team to assist in the care of this patient.   Time In: 0930 Time Out: 1005 Total Time 35 mins Prolonged Time Billed no      Greater than 50%  of this time was spent counseling and coordinating care related to the above assessment and plan.  Ronald Weber, Ronald Weber Palliative [facility]   Please contact Palliative Medicine Team phone at [phone] for questions and concerns.

## 2017-05-22 NOTE — Progress Notes (Signed)
New hospice home referral received from [facility] following a Palliative Medicine follow up visit.Pateint is an 83 year old man with a history of anxiety depression, CKD stage III, complete heart block status post pacemaker, recent fall with acute respiratory failure and traumatic rhabdomyolysis who was discharged recently from rehab to assisted living. He presented to the ED on 12/11  with increasing shortness of breath for 2 days with continuing cough and congestion.  He was found to be hypotensive and in acute respiratory failure, required Bipap, found to have both PE and and left leg DVT.  He continued to decline despite medical interventions including dialysis. Palliative Medicaine was consulted for goals of care and met with the family on 12/15. He is currently comfort care, requiring IV robinul for secretion management and Iv lorazepam for anxiety. He has also required IV morphine for dyspnea. Palliative Medicine met again  with patient's family this morning and they have chosen for him to transfer to the Hospice home for symptom management and end of life care. Writer met in the room with patient's daughter in law a Juliann Pulse to initiate education regarding hospice services, philosophy and team approach to care with good understanding voiced. Questions answered, consents signed. Patient information faxed to referral, report called to the hospice home. EMS notified for transport. Hospital care team all aware.  Flo Shanks RN, BSN, [facility], hospital Liaison [phone]

## 2017-05-22 NOTE — Discharge Summary (Signed)
[facility] [place] [facility]   PATIENT NAME: Ronald Weber    MR#:  [id number]  DATE OF BIRTH:  Apr 09, 1929  DATE OF ADMISSION:  05/16/2017   ADMITTING PHYSICIAN: Enedina FinnerSona Patel, MD  DATE OF DISCHARGE: No discharge date for patient encounter.  PRIMARY CARE PHYSICIAN: Lauro RegulusAnderson, Marshall W, MD   ADMISSION DIAGNOSIS:  Swelling [R60.9] Hypoxia [R09.02] Sepsis, due to unspecified organism [facility](HCC) [A41.9] Multifocal pneumonia [J18.9] Other pulmonary embolism without acute cor pulmonale, unspecified chronicity (HCC) [I26.99] DISCHARGE DIAGNOSIS:  Active Problems:   Sepsis (HCC)   Pressure injury of skin   Multifocal pneumonia   Advance care planning   Goals of care, counseling/discussion   Palliative care by specialist  SECONDARY DIAGNOSIS:   Past Medical History:  Diagnosis Date  . Anxiety   . Atrioventricular block    s/p pacemaker placement  . B12 deficiency   . BPH with elevated PSA   . BPH with obstruction/lower urinary tract symptoms   . Chronic low back pain   . COPD (chronic obstructive pulmonary disease) (HCC)   . Depression   . Diverticulitis   . Gout   . Hypertension   . Lumbar disc disease   . Renal insufficiency   . Sensory loss   . Sleep apnea   . Trigeminal neuralgia   . Urge incontinence    HOSPITAL COURSE:   83 year old male with past medical history significant for complete heart block status post pacemaker, anxiety and depression, hypertension, COPD, arthritis, recent admission for traumatic rhabdomyolysis currently from assisted living facility came in with shortness of breath and noted to have PE and pneumonia  1. Septic shock-secondary to multifocal pneumonia.  2. Acute renal failure with hyperkalemia- ATN secondary to hypotension and sepsis -Patient was on CRRT now discontinued since he is comfort care  3. Acute hypoxic respiratory failure-secondary to multifocal pneumonia and pulmonary emboli -Off BiPAP  -currently  on 2 L nasal cannula  4. DVT and PE-recent immobilization from fall and rehabilitation. -Left lower extremity DVT, but due to acute illness-not a candidate for thrombolysis.   On comfort care. Palliative care consultation: Hospice home.  DISCHARGE CONDITIONS:  Discharge to hospice home today. CONSULTS OBTAINED:   DRUG ALLERGIES:   Allergies  Allergen Reactions  . Colchicine   . Doxycycline Other (See Comments)  . Meloxicam Other (See Comments)  . Sulfa Antibiotics     Other reaction(s): UNKNOWN  . Tussin  [Guaifenesin] Other (See Comments)  . Amoxicillin-Pot Clavulanate Rash   DISCHARGE MEDICATIONS:   Allergies as of 05/22/2017      Reactions   Colchicine    Doxycycline Other (See Comments)   Meloxicam Other (See Comments)   Sulfa Antibiotics    Other reaction(s): UNKNOWN   Tussin  [guaifenesin] Other (See Comments)   Amoxicillin-pot Clavulanate Rash      Medication List    STOP taking these medications   acetaminophen 325 MG tablet Commonly known as:  TYLENOL   aspirin 81 MG EC tablet   azithromycin 250 MG tablet Commonly known as:  ZITHROMAX   budesonide-formoterol 160-4.5 MCG/ACT inhaler Commonly known as:  SYMBICORT   docusate sodium 100 MG capsule Commonly known as:  COLACE   furosemide 20 MG tablet Commonly known as:  LASIX   hydrOXYzine 25 MG tablet Commonly known as:  ATARAX/VISTARIL   levofloxacin 500 MG tablet Commonly known as:  LEVAQUIN   lidocaine 5 % Commonly known as:  LIDODERM   losartan 100 MG tablet Commonly  known as:  COZAAR   metoprolol tartrate 25 MG tablet Commonly known as:  LOPRESSOR   sertraline 25 MG tablet Commonly known as:  ZOLOFT   tolterodine 4 MG 24 hr capsule Commonly known as:  DETROL LA   TRAVATAN Z 0.004 % Soln ophthalmic solution Generic drug:  Travoprost (BAK Free)   zolpidem 5 MG tablet Commonly known as:  AMBIEN     TAKE these medications   antiseptic oral rinse Liqd Apply 15 mLs topically  as needed for dry mouth.   fentaNYL 100 MCG/2ML injection Commonly known as:  SUBLIMAZE Inject 1 mL (50 mcg total) into the vein every 30 (thirty) minutes as needed for severe pain (or dyspnea).   glycopyrrolate 1 MG tablet Commonly known as:  ROBINUL Take 1 tablet (1 mg total) by mouth every 4 (four) hours as needed (excessive secretions).   haloperidol 0.5 MG tablet Commonly known as:  HALDOL Take 1 tablet (0.5 mg total) by mouth every 4 (four) hours as needed for agitation (or delirium).   LORazepam 1 MG tablet Commonly known as:  ATIVAN Take 1 tablet (1 mg total) by mouth every 4 (four) hours as needed for anxiety.   morphine CONCENTRATE 10 MG/0.5ML Soln concentrated solution Place 0.25 mLs (5 mg total) under the tongue every hour as needed for moderate pain or shortness of breath (RR>22).   morphine 2 MG/ML injection Inject 1 mL (2 mg total) into the vein every hour as needed (shortness of breath, rr>22).   polyvinyl alcohol 1.4 % ophthalmic solution Commonly known as:  LIQUIFILM TEARS Place 1 drop into both eyes 4 (four) times daily as needed for dry eyes.   scopolamine 1 MG/3DAYS Commonly known as:  TRANSDERM-SCOP Place 1 patch (1.5 mg total) onto the skin every 3 (three) days.        DISCHARGE INSTRUCTIONS:  See AVS. If you experience worsening of your admission symptoms, develop shortness of breath, life threatening emergency, suicidal or homicidal thoughts you must seek medical attention immediately by calling 911 or calling your MD immediately  if symptoms less severe.  You Must read complete instructions/literature along with all the possible adverse reactions/side effects for all the Medicines you take and that have been prescribed to you. Take any new Medicines after you have completely understood and accpet all the possible adverse reactions/side effects.   Please note  You were cared for by a hospitalist during your hospital stay. If you have any questions  about your discharge medications or the care you received while you were in the hospital after you are discharged, you can call the unit and asked to speak with the hospitalist on call if the hospitalist that took care of you is not available. Once you are discharged, your primary care physician will handle any further medical issues. Please note that NO REFILLS for any discharge medications will be authorized once you are discharged, as it is imperative that you return to your primary care physician (or establish a relationship with a primary care physician if you do not have one) for your aftercare needs so that they can reassess your need for medications and monitor your lab values.    On the day of Discharge:  VITAL SIGNS:  Blood pressure 131/70, pulse (!) 107, temperature 98.8 F (37.1 C), temperature source Oral, resp. rate 16, height 5\' 10"  (1.778 m), weight 172 lb 6.4 oz (78.2 kg), SpO2 97 %.     DATA REVIEW:   CBC Recent Labs  Lab 05/19/17 0419  WBC 11.0*  HGB 9.1*  HCT 27.4*  PLT 165    Chemistries  Recent Labs  Lab 05/16/17 0931  05/19/17 0419  NA 139   < > 135  K 2.7*   < > 3.7  CL 121*   < > 103  CO2 9*   < > 25  GLUCOSE 56*   < > 115*  BUN 34*   < > 14  CREATININE 2.28*   < > 0.78  CALCIUM <4.0*   < > 7.8*  MG  --    < > 1.7  AST 71*  --   --   ALT 57  --   --   ALKPHOS 54  --   --   BILITOT 0.7  --   --    < > = values in this interval not displayed.     Microbiology Results  Results for orders placed or performed during the hospital encounter of 05/16/17  Culture, blood (Routine x 2)     Status: None   Collection Time: 05/16/17  9:29 AM  Result Value Ref Range Status   Specimen Description BLOOD LEFT FORARM  Final   Special Requests BOTTLES DRAWN AEROBIC AND ANAEROBIC BCHV  Final   Culture NO GROWTH 5 DAYS  Final   Report Status 05/21/2017 FINAL  Final  Culture, blood (Routine x 2)     Status: None   Collection Time: 05/16/17  9:29 AM  Result  Value Ref Range Status   Specimen Description BLOOD RIGHT ARM  Final   Special Requests BOTTLES DRAWN AEROBIC AND ANAEROBIC  BCHV  Final   Culture NO GROWTH 5 DAYS  Final   Report Status 05/21/2017 FINAL  Final  MRSA PCR Screening     Status: Abnormal   Collection Time: 05/16/17  1:18 PM  Result Value Ref Range Status   MRSA by PCR POSITIVE (A) NEGATIVE Final    Comment:        The GeneXpert MRSA Assay (FDA approved for NASAL specimens only), is one component of a comprehensive MRSA colonization surveillance program. It is not intended to diagnose MRSA infection nor to guide or monitor treatment for MRSA infections. RESULT CALLED TO, READ BACK BY AND VERIFIED WITH: JAKE ROBINSON 05/16/17 1731 KLW     RADIOLOGY:  No results found.   Management plans discussed with the patient, family and they are in agreement.  CODE STATUS: DNR   TOTAL TIME TAKING CARE OF THIS PATIENT: 35 minutes.    Shaune Pollack M.D on 05/22/2017 at 12:11 PM  Between 7am to 6pm - Pager - [phone]  After 6pm go to [email or web address] - Scientist, research (life sciences) [facility]  Office  [phone]  CC: Primary care physician; Lauro Regulus, MD   Note: This dictation was prepared with Dragon dictation along with smaller phrase technology. Any transcriptional errors that result from this process are unintentional.

## 2017-06-06 DEATH — deceased

## 2017-06-21 ENCOUNTER — Ambulatory Visit: Payer: Medicare Other | Admitting: Internal Medicine

## 2019-01-18 IMAGING — CT CT ANGIO CHEST
2 of 6 series · 18 of 46 positions shown · IV contrast (APPLIED)
Comparison: Chest x-ray earlier today.  Chest CT 11/21/2007

CLINICAL DATA: Respiratory distress

EXAM:
CT ANGIOGRAPHY CHEST WITH CONTRAST
TECHNIQUE: Multidetector CT imaging of the chest was performed using the
standard protocol during bolus administration of intravenous
contrast. Multiplanar CT image reconstructions and MIPs were
obtained to evaluate the vascular anatomy.
CONTRAST:  75mL A2G07X-3FN IOPAMIDOL (A2G07X-3FN) INJECTION 76%

[Series 5: thins · axial · 0.62mm/px · z∈[-703,-465]mm · 16 of 262 slices shown]
[im 12/262  lung]
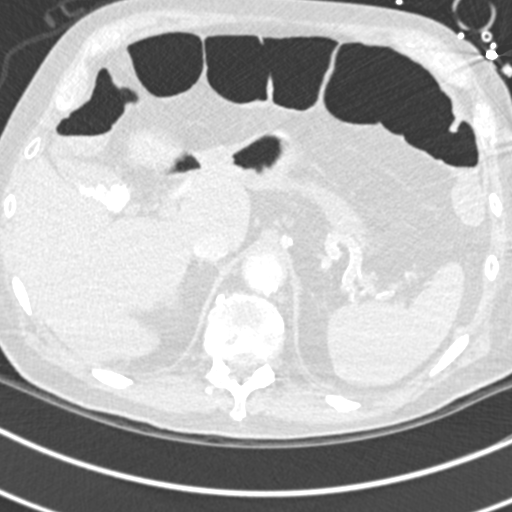
[im 35/262  soft-tissue]
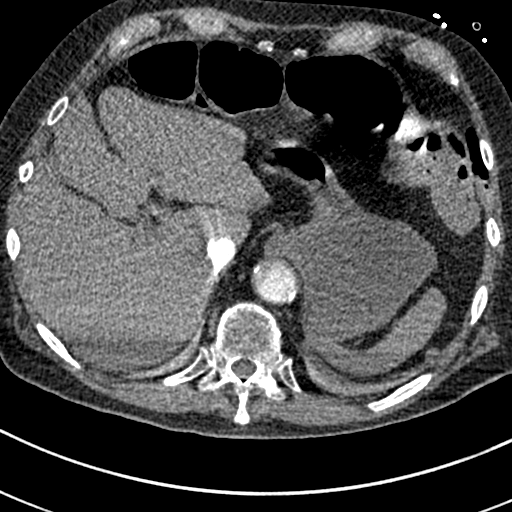
[im 46/262  lung]
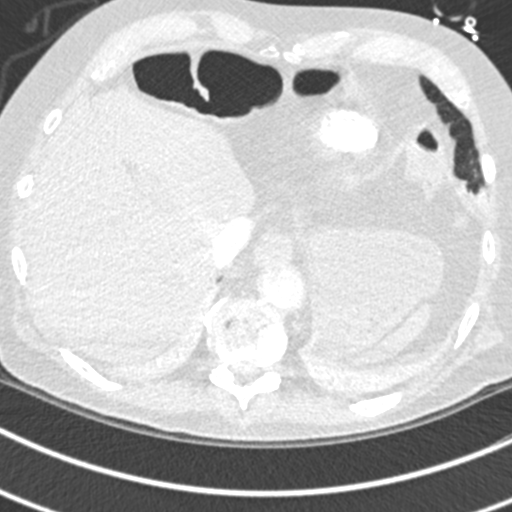
[im 57/262  soft-tissue]
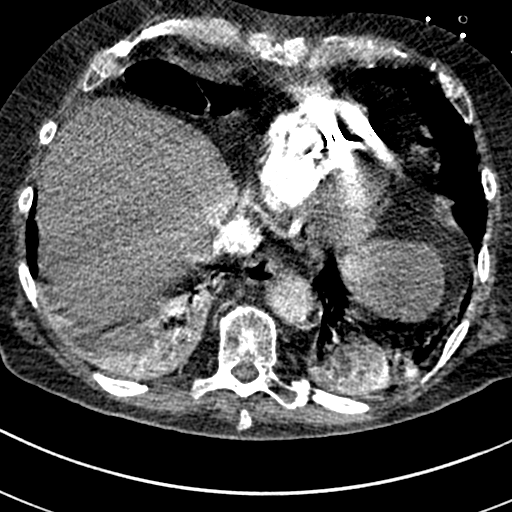
[im 80/262  lung]
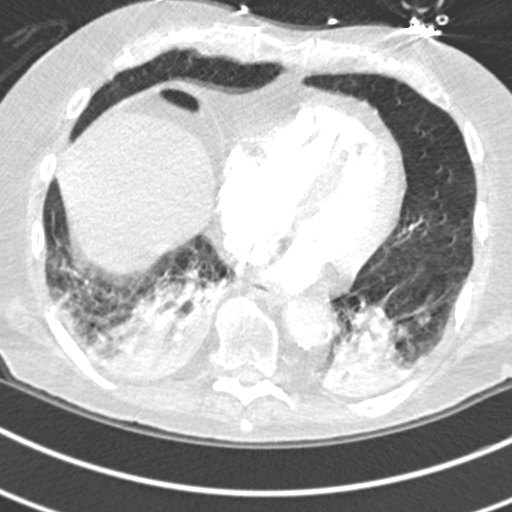
[im 91/262  soft-tissue]
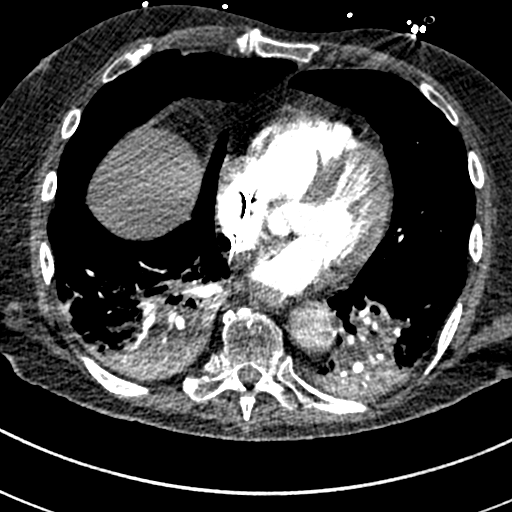
[im 103/262  lung]
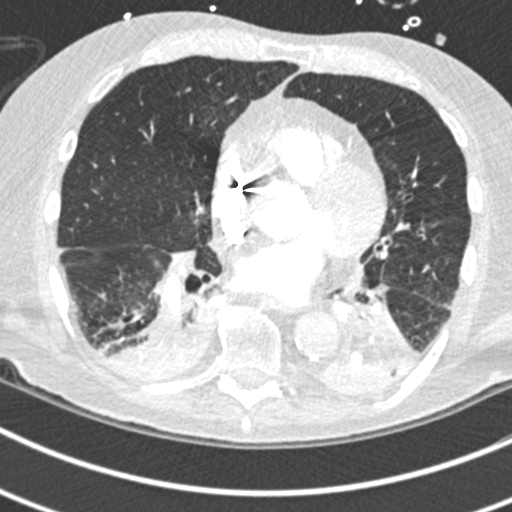
[im 125/262  soft-tissue]
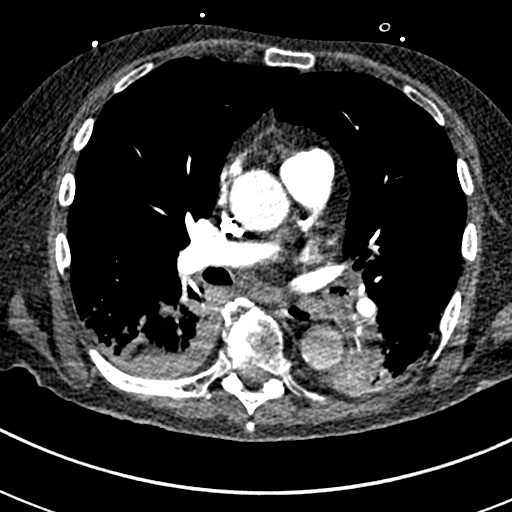
[im 137/262  lung]
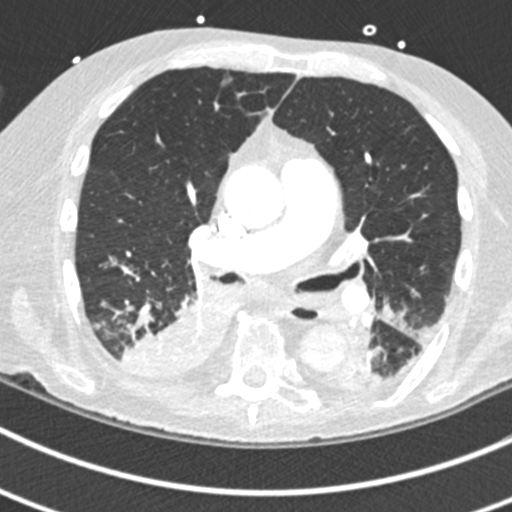
[im 159/262  soft-tissue]
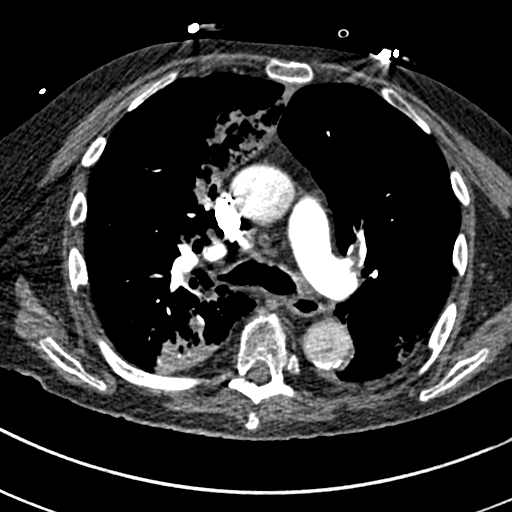
[im 171/262  lung]
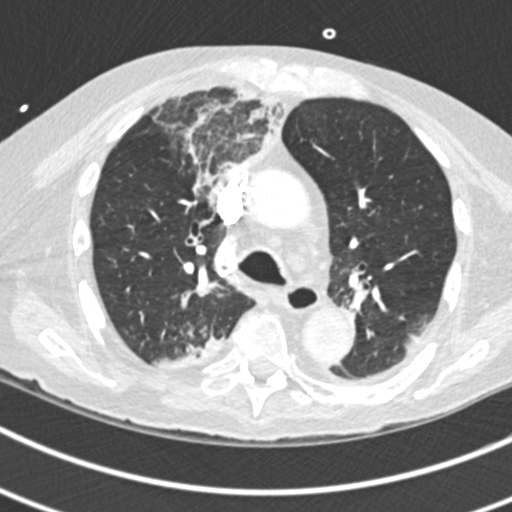
[im 182/262  soft-tissue]
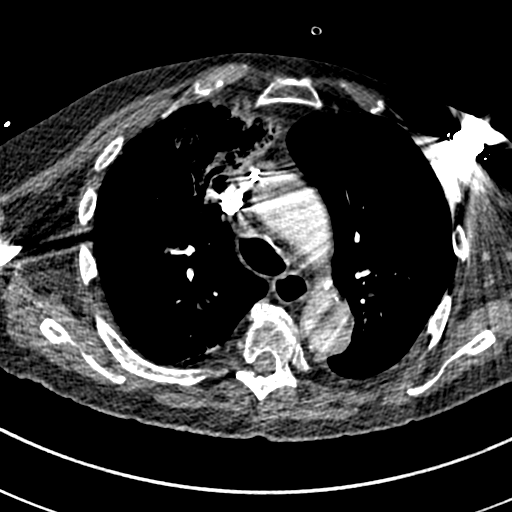
[im 205/262  lung]
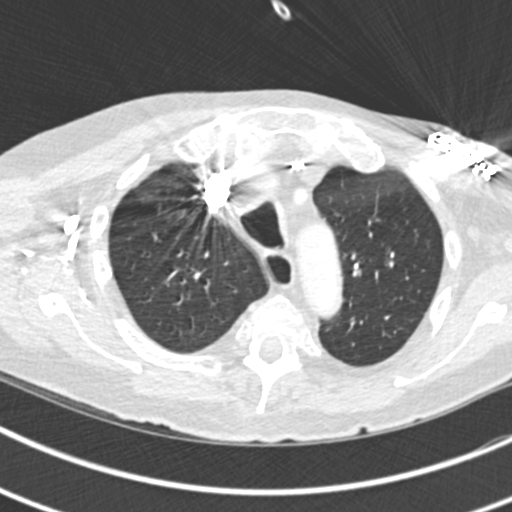
[im 216/262  soft-tissue]
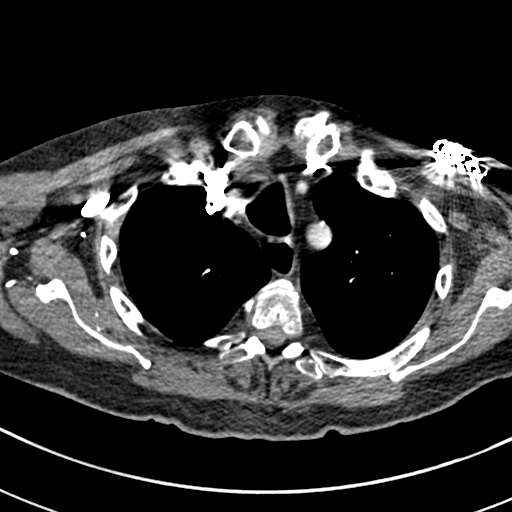
[im 227/262  lung]
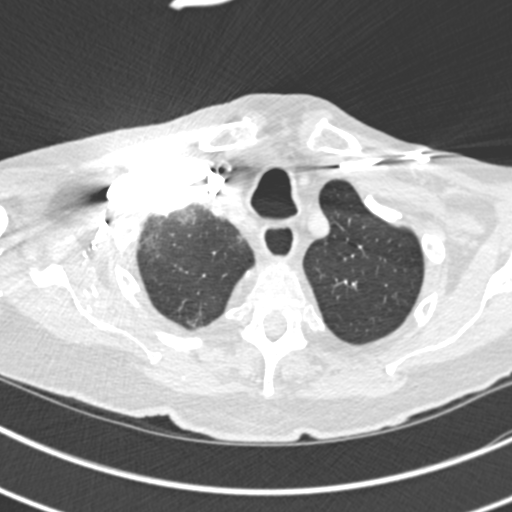
[im 250/262  soft-tissue]
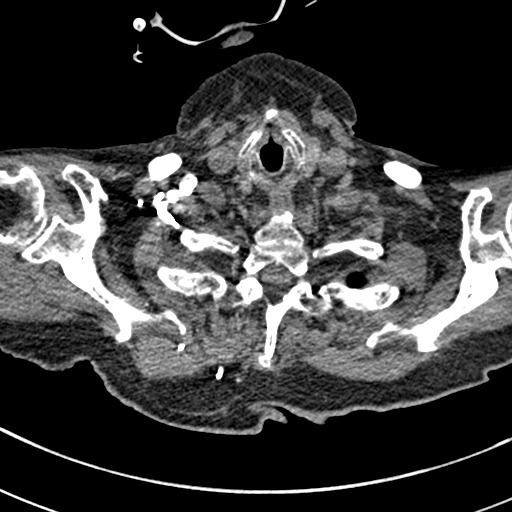

[Series 7: coronal mpr · coronal · 0.53mm/px · 2 of 88 slices shown]
[im 30/88  soft-tissue]
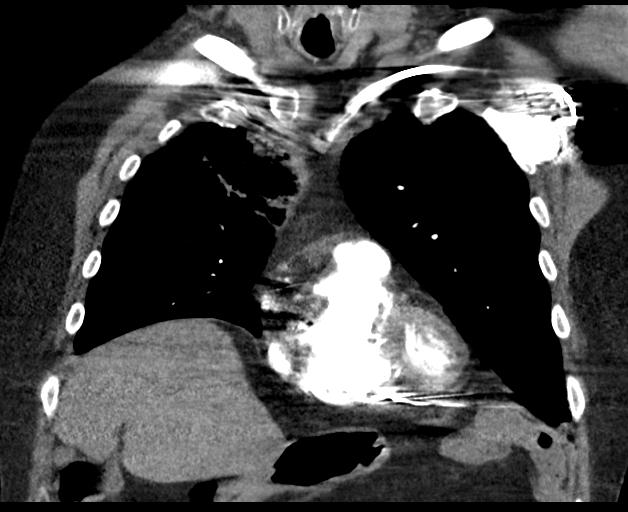
[im 59/88  soft-tissue]
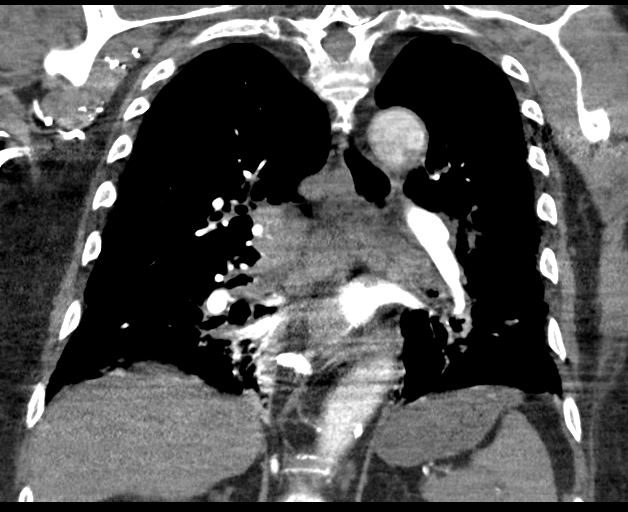

[18 of 46 positions shown; findings below may reference images not displayed]

FINDINGS: Cardiovascular: Heart is normal size. Aorta is normal caliber.
Scattered coronary artery and aortic calcifications. Pulmonary
embolus noted within the left pulmonary artery extending into to
segmental upper lobe branches, best seen on coronal images 48-52. No
evidence of right heart strain

Mediastinum/Nodes: No mediastinal, hilar, or axillary adenopathy.

Lungs/Pleura: Airspace disease in both lower lobes concerning for
pneumonia. Patchy airspace disease in the anterior right upper lobe
and posterior lingula.

Upper Abdomen: Multiple gallstones layering in the gallbladder. No
acute findings.

Musculoskeletal: Pacer in the left chest wall. No acute bony
abnormality.

Review of the MIP images confirms the above findings.
IMPRESSION: Pulmonary emboli noted in the left upper lobe pulmonary artery
extending into to segmental upper lobe branches.

Bilateral lower lobe airspace consolidation. Patchy opacities in
both upper lobes. Findings concerning for multifocal pneumonia.

Coronary artery disease, aortic atherosclerosis.

Cholelithiasis.

## 2019-01-18 IMAGING — DX DG CHEST 1V PORT
1 series · 1 of 1 positions shown · non-contrast
Comparison: 04/12/2017

CLINICAL DATA: Respiratory distress.  Hypoxia.  COPD.

EXAM:
PORTABLE CHEST 1 VIEW

[chest ap]
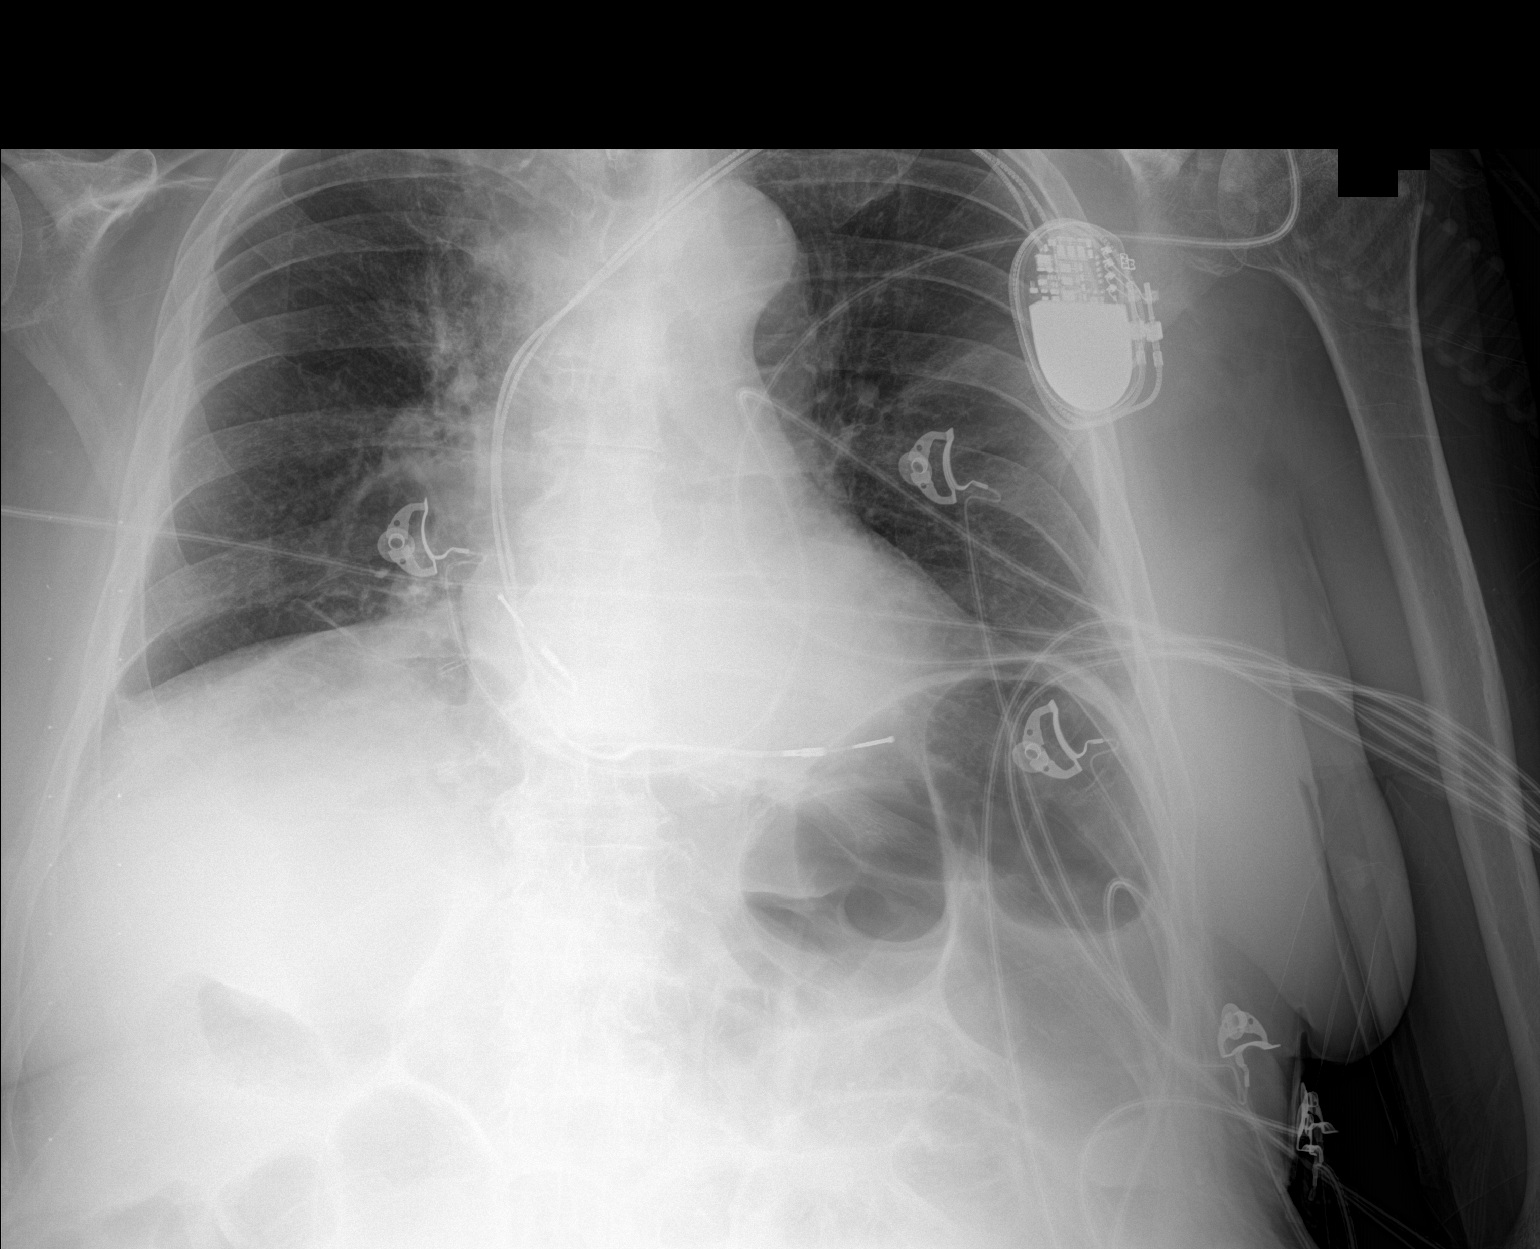

[1 of 1 positions shown; findings below may reference images not displayed]

FINDINGS: There is a focal area of new increased density at the left lung base
posterior medially. The lungs are otherwise clear. Heart size and
vascularity are normal. Tortuosity and slight calcification of the
thoracic aorta.

No acute bone abnormality.  Pacemaker in place.
IMPRESSION: Focal area of new consolidation at the left lung base posterior
medially could represent atelectasis or pneumonia.

Aortic atherosclerosis.
